# Patient Record
Sex: Male | Born: 1955
Health system: Southern US, Community
[De-identification: ages and names within clinical notes are randomized; demographics above are authoritative.]

## PROBLEM LIST (undated history)

## (undated) DIAGNOSIS — N186 End stage renal disease: Secondary | ICD-10-CM

## (undated) DIAGNOSIS — I639 Cerebral infarction, unspecified: Secondary | ICD-10-CM

## (undated) DIAGNOSIS — C801 Malignant (primary) neoplasm, unspecified: Secondary | ICD-10-CM

## (undated) DIAGNOSIS — I10 Essential (primary) hypertension: Secondary | ICD-10-CM

## (undated) DIAGNOSIS — Z992 Dependence on renal dialysis: Secondary | ICD-10-CM

## (undated) DIAGNOSIS — K920 Hematemesis: Secondary | ICD-10-CM

## (undated) DIAGNOSIS — E611 Iron deficiency: Secondary | ICD-10-CM

## (undated) DIAGNOSIS — IMO0001 Reserved for inherently not codable concepts without codable children: Secondary | ICD-10-CM

---

## 2016-01-21 ENCOUNTER — Inpatient Hospital Stay (HOSPITAL_COMMUNITY)
Admission: EM | Admit: 2016-01-21 | Discharge: 2016-01-24 | DRG: 065 | Disposition: A | Discharge status: Home / Self Care | Payer: BLUE CROSS/BLUE SHIELD | Attending: Internal Medicine | Admitting: Internal Medicine

## 2016-01-21 ENCOUNTER — Encounter (HOSPITAL_COMMUNITY): Payer: Self-pay | Admitting: *Deleted

## 2016-01-21 ENCOUNTER — Emergency Department (HOSPITAL_COMMUNITY): Payer: BLUE CROSS/BLUE SHIELD

## 2016-01-21 DIAGNOSIS — D509 Iron deficiency anemia, unspecified: Secondary | ICD-10-CM

## 2016-01-21 DIAGNOSIS — D631 Anemia in chronic kidney disease: Secondary | ICD-10-CM | POA: Diagnosis present

## 2016-01-21 DIAGNOSIS — N400 Enlarged prostate without lower urinary tract symptoms: Secondary | ICD-10-CM | POA: Diagnosis present

## 2016-01-21 DIAGNOSIS — E8770 Fluid overload, unspecified: Secondary | ICD-10-CM | POA: Diagnosis present

## 2016-01-21 DIAGNOSIS — E875 Hyperkalemia: Secondary | ICD-10-CM | POA: Diagnosis present

## 2016-01-21 DIAGNOSIS — N186 End stage renal disease: Secondary | ICD-10-CM

## 2016-01-21 DIAGNOSIS — H539 Unspecified visual disturbance: Secondary | ICD-10-CM | POA: Diagnosis not present

## 2016-01-21 DIAGNOSIS — N179 Acute kidney failure, unspecified: Secondary | ICD-10-CM | POA: Diagnosis not present

## 2016-01-21 DIAGNOSIS — I161 Hypertensive emergency: Secondary | ICD-10-CM | POA: Diagnosis present

## 2016-01-21 DIAGNOSIS — Z8673 Personal history of transient ischemic attack (TIA), and cerebral infarction without residual deficits: Secondary | ICD-10-CM | POA: Diagnosis not present

## 2016-01-21 DIAGNOSIS — N281 Cyst of kidney, acquired: Secondary | ICD-10-CM | POA: Diagnosis present

## 2016-01-21 DIAGNOSIS — N184 Chronic kidney disease, stage 4 (severe): Secondary | ICD-10-CM

## 2016-01-21 DIAGNOSIS — E1122 Type 2 diabetes mellitus with diabetic chronic kidney disease: Secondary | ICD-10-CM | POA: Diagnosis present

## 2016-01-21 DIAGNOSIS — E871 Hypo-osmolality and hyponatremia: Secondary | ICD-10-CM | POA: Diagnosis present

## 2016-01-21 DIAGNOSIS — Z992 Dependence on renal dialysis: Secondary | ICD-10-CM

## 2016-01-21 DIAGNOSIS — I63412 Cerebral infarction due to embolism of left middle cerebral artery: Principal | ICD-10-CM | POA: Diagnosis present

## 2016-01-21 DIAGNOSIS — I639 Cerebral infarction, unspecified: Secondary | ICD-10-CM | POA: Diagnosis present

## 2016-01-21 DIAGNOSIS — N185 Chronic kidney disease, stage 5: Secondary | ICD-10-CM | POA: Diagnosis present

## 2016-01-21 DIAGNOSIS — H538 Other visual disturbances: Secondary | ICD-10-CM

## 2016-01-21 DIAGNOSIS — I120 Hypertensive chronic kidney disease with stage 5 chronic kidney disease or end stage renal disease: Secondary | ICD-10-CM | POA: Diagnosis present

## 2016-01-21 DIAGNOSIS — E785 Hyperlipidemia, unspecified: Secondary | ICD-10-CM | POA: Diagnosis present

## 2016-01-21 HISTORY — DX: Essential (primary) hypertension: I10

## 2016-01-21 LAB — PROTIME-INR
INR: 0.99 (ref 0.00–1.49)
PROTHROMBIN TIME: 13.3 s (ref 11.6–15.2)

## 2016-01-21 LAB — DIFFERENTIAL
BASOS PCT: 1 %
Basophils Absolute: 0 10*3/uL (ref 0.0–0.1)
EOS PCT: 6 %
Eosinophils Absolute: 0.3 10*3/uL (ref 0.0–0.7)
LYMPHS ABS: 1.2 10*3/uL (ref 0.7–4.0)
LYMPHS PCT: 24 %
MONO ABS: 0.4 10*3/uL (ref 0.1–1.0)
MONOS PCT: 9 %
Neutro Abs: 3 10*3/uL (ref 1.7–7.7)
Neutrophils Relative %: 60 %

## 2016-01-21 LAB — COMPREHENSIVE METABOLIC PANEL
ALK PHOS: 81 U/L (ref 38–126)
ALT: 21 U/L (ref 17–63)
AST: 19 U/L (ref 15–41)
Albumin: 2.6 g/dL — ABNORMAL LOW (ref 3.5–5.0)
Anion gap: 12 (ref 5–15)
BILIRUBIN TOTAL: 0.3 mg/dL (ref 0.3–1.2)
BUN: 30 mg/dL — AB (ref 6–20)
CO2: 24 mmol/L (ref 22–32)
CREATININE: 6.03 mg/dL — AB (ref 0.61–1.24)
Calcium: 7.9 mg/dL — ABNORMAL LOW (ref 8.9–10.3)
Chloride: 99 mmol/L — ABNORMAL LOW (ref 101–111)
GFR calc Af Amer: 11 mL/min — ABNORMAL LOW (ref >60)
GFR calc non Af Amer: 9 mL/min — ABNORMAL LOW (ref >60)
GLUCOSE: 109 mg/dL — AB (ref 65–99)
POTASSIUM: 4.1 mmol/L (ref 3.5–5.1)
SODIUM: 135 mmol/L (ref 135–145)
TOTAL PROTEIN: 5.9 g/dL — AB (ref 6.5–8.1)

## 2016-01-21 LAB — RAPID URINE DRUG SCREEN, HOSP PERFORMED
AMPHETAMINES: NOT DETECTED
BARBITURATES: NOT DETECTED
BENZODIAZEPINES: NOT DETECTED
Cocaine: NOT DETECTED
Opiates: NOT DETECTED
Tetrahydrocannabinol: NOT DETECTED

## 2016-01-21 LAB — I-STAT CHEM 8, ED
BUN: 31 mg/dL — AB (ref 6–20)
CALCIUM ION: 0.98 mmol/L — AB (ref 1.12–1.23)
CHLORIDE: 96 mmol/L — AB (ref 101–111)
Creatinine, Ser: 5.8 mg/dL — ABNORMAL HIGH (ref 0.61–1.24)
Glucose, Bld: 103 mg/dL — ABNORMAL HIGH (ref 65–99)
HCT: 38 % — ABNORMAL LOW (ref 39.0–52.0)
Hemoglobin: 12.9 g/dL — ABNORMAL LOW (ref 13.0–17.0)
Potassium: 4.1 mmol/L (ref 3.5–5.1)
SODIUM: 134 mmol/L — AB (ref 135–145)
TCO2: 25 mmol/L (ref 0–100)

## 2016-01-21 LAB — IRON AND TIBC
IRON: 33 ug/dL — AB (ref 45–182)
Saturation Ratios: 18 % (ref 17.9–39.5)
TIBC: 186 ug/dL — ABNORMAL LOW (ref 250–450)
UIBC: 153 ug/dL

## 2016-01-21 LAB — APTT: aPTT: 32 seconds (ref 24–37)

## 2016-01-21 LAB — CBC
HCT: 34.1 % — ABNORMAL LOW (ref 39.0–52.0)
HEMOGLOBIN: 11 g/dL — AB (ref 13.0–17.0)
MCH: 25.6 pg — AB (ref 26.0–34.0)
MCHC: 32.3 g/dL (ref 30.0–36.0)
MCV: 79.5 fL (ref 78.0–100.0)
Platelets: 415 10*3/uL — ABNORMAL HIGH (ref 150–400)
RBC: 4.29 MIL/uL (ref 4.22–5.81)
RDW: 14.6 % (ref 11.5–15.5)
WBC: 5 10*3/uL (ref 4.0–10.5)

## 2016-01-21 LAB — I-STAT TROPONIN, ED: Troponin i, poc: 0.01 ng/mL (ref 0.00–0.08)

## 2016-01-21 LAB — CBG MONITORING, ED: Glucose-Capillary: 107 mg/dL — ABNORMAL HIGH (ref 65–99)

## 2016-01-21 MED ORDER — ACETAMINOPHEN 650 MG RE SUPP: 650.0000 mg | Freq: Four times a day (QID) | RECTAL | Status: DC | PRN

## 2016-01-21 MED ORDER — FLUORESCEIN SODIUM 1 MG OP STRP
1.0000 | ORAL_STRIP | Freq: Once | OPHTHALMIC | Status: AC
  Administered 2016-01-21: 1 via OPHTHALMIC
  Filled 2016-01-21: qty 1

## 2016-01-21 MED ORDER — CLONIDINE HCL 0.1 MG PO TABS
0.1000 mg | ORAL_TABLET | Freq: Four times a day (QID) | ORAL | Status: DC | PRN
  Administered 2016-01-21 – 2016-01-22 (×2): 0.1 mg via ORAL
  Filled 2016-01-21 (×2): qty 1

## 2016-01-21 MED ORDER — ACETAMINOPHEN 325 MG PO TABS: 650.0000 mg | ORAL_TABLET | Freq: Four times a day (QID) | ORAL | Status: DC | PRN

## 2016-01-21 MED ORDER — LOSARTAN POTASSIUM-HCTZ 100-25 MG PO TABS: 1.0000 | ORAL_TABLET | Freq: Every day | ORAL | Status: DC

## 2016-01-21 MED ORDER — LOSARTAN POTASSIUM 50 MG PO TABS: 100.0000 mg | ORAL_TABLET | Freq: Every day | ORAL | Status: DC

## 2016-01-21 MED ORDER — LABETALOL HCL 5 MG/ML IV SOLN
10.0000 mg | Freq: Once | INTRAVENOUS | Status: AC
  Administered 2016-01-21: 10 mg via INTRAVENOUS
  Filled 2016-01-21: qty 4

## 2016-01-21 MED ORDER — ONDANSETRON HCL 4 MG/2ML IJ SOLN: 4.0000 mg | Freq: Four times a day (QID) | INTRAMUSCULAR | Status: DC | PRN

## 2016-01-21 MED ORDER — METOPROLOL TARTRATE 25 MG PO TABS
25.0000 mg | ORAL_TABLET | Freq: Two times a day (BID) | ORAL | Status: DC
  Administered 2016-01-21 – 2016-01-22 (×2): 25 mg via ORAL
  Filled 2016-01-21 (×2): qty 1

## 2016-01-21 MED ORDER — SENNOSIDES-DOCUSATE SODIUM 8.6-50 MG PO TABS: 1.0000 | ORAL_TABLET | Freq: Every evening | ORAL | Status: DC | PRN

## 2016-01-21 MED ORDER — HYDRALAZINE HCL 20 MG/ML IJ SOLN
5.0000 mg | Freq: Four times a day (QID) | INTRAMUSCULAR | Status: DC | PRN
  Administered 2016-01-21: 5 mg via INTRAVENOUS
  Filled 2016-01-21: qty 1

## 2016-01-21 MED ORDER — HEPARIN SODIUM (PORCINE) 5000 UNIT/ML IJ SOLN
5000.0000 [IU] | Freq: Three times a day (TID) | INTRAMUSCULAR | Status: DC
  Administered 2016-01-21 – 2016-01-24 (×9): 5000 [IU] via SUBCUTANEOUS
  Filled 2016-01-21 (×8): qty 1

## 2016-01-21 MED ORDER — TETRACAINE HCL 0.5 % OP SOLN
2.0000 [drp] | Freq: Once | OPHTHALMIC | Status: AC
  Administered 2016-01-21: 2 [drp] via OPHTHALMIC
  Filled 2016-01-21: qty 2

## 2016-01-21 MED ORDER — HYDROCHLOROTHIAZIDE 25 MG PO TABS: 25.0000 mg | ORAL_TABLET | Freq: Every day | ORAL | Status: DC

## 2016-01-21 MED ORDER — HYDROCODONE-ACETAMINOPHEN 5-325 MG PO TABS: 1.0000 | ORAL_TABLET | ORAL | Status: DC | PRN

## 2016-01-21 MED ORDER — SODIUM CHLORIDE 0.9% FLUSH
3.0000 mL | Freq: Two times a day (BID) | INTRAVENOUS | Status: DC
  Administered 2016-01-22 – 2016-01-24 (×5): 3 mL via INTRAVENOUS

## 2016-01-21 MED ORDER — HYDRALAZINE HCL 25 MG PO TABS
25.0000 mg | ORAL_TABLET | Freq: Three times a day (TID) | ORAL | Status: DC
  Administered 2016-01-21: 25 mg via ORAL
  Filled 2016-01-21: qty 1

## 2016-01-21 MED ORDER — ONDANSETRON HCL 4 MG PO TABS: 4.0000 mg | ORAL_TABLET | Freq: Four times a day (QID) | ORAL | Status: DC | PRN

## 2016-01-21 NOTE — ED Provider Notes (Signed)
CSN: [id number]     Arrival date & time 01/21/16  [id number] History   First MD Initiated Contact with Patient 01/21/16 1002     Chief Complaint  Patient presents with  . Visual Field Change     (Consider location/radiation/quality/duration/timing/severity/associated sxs/prior Treatment) HPI  Blood pressure 224/103, pulse 75, temperature 97.7 F (36.5 C), temperature source Oral, resp. rate 18, height 5\' 4"  (1.626 m), weight 79.379 kg, SpO2 100 %.  Allen Miller is a 60 y.o. male complaining of blurred vision in the medial aspects of the left eye onset yesterday evening. Patient woke up this morning and states that the blurred vision has improved but it still persists. Patient denies active headache, states that he had a mild, 6 out of 10 left posterior occipital headache which resolved spontaneously last night. He's been compliant with his hypertension medications of lisinopril and metoprolol. Patient denies numbness, weakness, dysarthria, ataxia, chest pain, shortness of breath, nausea, vomiting, history of atrial fibrillation. States that he used to be a diabetic but is no longer required to take medications, is a former smoker and hasn't smoked in 45 years. He is in the area visiting his son, he plans on moving here from [place].  Patient states that since he takes his blood pressure regularly with a cough from [facility], states the systolic always runs in the Q000111Q, systolic is never in the [id number].  Past Medical History  Diagnosis Date  . Hypertension    History reviewed. No pertinent past surgical history. No family history on file. Social History  Substance Use Topics  . Smoking status: Never Smoker   . Smokeless tobacco: None  . Alcohol Use: None    Review of Systems  10 systems reviewed and found to be negative, except as noted in the HPI.  Allergies  Review of patient's allergies indicates no known allergies.  Home Medications   Prior to Admission medications   Not on  File   BP 224/103 mmHg  Pulse 75  Temp(Src) 97.7 F (36.5 C) (Oral)  Resp 18  Ht 5\' 4"  (1.626 m)  Wt 79.379 kg  BMI 30.02 kg/m2  SpO2 100% Physical Exam  Constitutional: He is oriented to person, place, and time. He appears well-developed and well-nourished.  HENT:  Head: Normocephalic and atraumatic.  Mouth/Throat: Oropharynx is clear and moist.  Eyes: Conjunctivae and EOM are normal. Pupils are equal, round, and reactive to light.  Extraocular movement is intact without pain or diplopia pupils are equal round and reactive to light, there is no afferent pupillary defect. Intraocular pressure is 17 mmHg on the left.  No TTP or induration of temporal arteries bilaterally  Neck: Normal range of motion. Neck supple.  FROM to C-spine. Pt can touch chin to chest without discomfort. No TTP of midline cervical spine.   Cardiovascular: Normal rate, regular rhythm and intact distal pulses.   Pulmonary/Chest: Effort normal and breath sounds normal. No respiratory distress. He has no wheezes. He has no rales. He exhibits no tenderness.  Abdominal: Soft. Bowel sounds are normal. There is no tenderness.  Musculoskeletal: Normal range of motion. He exhibits no edema or tenderness.  Neurological: He is alert and oriented to person, place, and time. No cranial nerve deficit.  Left eye, patient can count fingers III/IV/VI-Extraocular movements intact.  Pupils reactive bilaterally. V/VII-Smile symmetric, equal eyebrow raise,  facial sensation intact VIII- Hearing grossly intact IX/X-Normal gag XI-bilateral shoulder shrug XII-midline tongue extension Motor: 5/5 bilaterally with normal tone and bulk Cerebellar: Normal  finger-to-nose  and normal heel-to-shin test.   Romberg negative Ambulates with a coordinated gait   Nursing note and vitals reviewed.   ED Course  Procedures (including critical care time)  CRITICAL CARE Performed by: Monico Blitz   Total critical care time:  86minutes  Critical care time was exclusive of separately billable procedures and treating other patients.  Critical care was necessary to treat or prevent imminent or life-threatening deterioration.  Critical care was time spent personally by me on the following activities: development of treatment plan with patient and/or surrogate as well as nursing, discussions with consultants, evaluation of patient's response to treatment, examination of patient, obtaining history from patient or surrogate, ordering and performing treatments and interventions, ordering and review of laboratory studies, ordering and review of radiographic studies, pulse oximetry and re-evaluation of patient's condition.   EMERGENCY DEPARTMENT [place] OCULAR EXAM "Study: Limited Ultrasound of Orbit "  INDICATIONS: Vision loss  Linear probe utilized to obtain images in both long and short axis of the orbit having the patient look left and right if possible.  PERFORMED BY: Myself  IMAGES ARCHIVED?: Yes  LIMITATIONS: none  VIEWS USED: Left orbit  INTERPRETATION: No retinal detachment, Lens in proper position   Labs Review Labs Reviewed  CBC - Abnormal; Notable for the following:    Hemoglobin 11.0 (*)    HCT 34.1 (*)    MCH 25.6 (*)    Platelets 415 (*)    All other components within normal limits  COMPREHENSIVE METABOLIC PANEL - Abnormal; Notable for the following:    Chloride 99 (*)    Glucose, Bld 109 (*)    BUN 30 (*)    Creatinine, Ser 6.03 (*)    Calcium 7.9 (*)    Total Protein 5.9 (*)    Albumin 2.6 (*)    GFR calc non Af Amer 9 (*)    GFR calc Af Amer 11 (*)    All other components within normal limits  I-STAT CHEM 8, ED - Abnormal; Notable for the following:    Sodium 134 (*)    Chloride 96 (*)    BUN 31 (*)    Creatinine, Ser 5.80 (*)    Glucose, Bld 103 (*)    Calcium, Ion 0.98 (*)    Hemoglobin 12.9 (*)    HCT 38.0 (*)    All other components within normal limits  PROTIME-INR  APTT   DIFFERENTIAL  I-STAT TROPOININ, ED  CBG MONITORING, ED    Imaging Review Ct Head Wo Contrast  01/21/2016  CLINICAL DATA:  Blurred vision in the left eye since yesterday. Improving today. Posterior headache left side since yesterday EXAM: CT HEAD WITHOUT CONTRAST TECHNIQUE: Contiguous axial images were obtained from the base of the skull through the vertex without intravenous contrast. COMPARISON:  None. FINDINGS: Old appearing posterior left parietal infarct. Mild chronic small vessel disease throughout the deep white matter. Old right basal ganglia lacunar infarcts. No acute intracranial abnormality. Specifically, no hemorrhage, hydrocephalus, mass lesion, acute infarction, or significant intracranial injury. No acute calvarial abnormality. Visualized paranasal sinuses and mastoids clear. Orbital soft tissues unremarkable. IMPRESSION: Old right basal ganglia and left posterior parietal infarcts. Chronic small vessel disease. No acute intracranial abnormality. Electronically Signed   By: Rolm Baptise M.D.   On: 01/21/2016 09:59   I have personally reviewed and evaluated these images and lab results as part of my medical decision-making.   EKG Interpretation None      MDM   Final  diagnoses:  Blurred vision, left eye  Hypertensive emergency    Filed Vitals:   01/21/16 0850  BP: 224/103  Pulse: 75  Temp: 97.7 F (36.5 C)  TempSrc: Oral  Resp: 18  Height: 5\' 4"  (1.626 m)  Weight: 79.379 kg  SpO2: 100%    Medications  tetracaine (PONTOCAINE) 0.5 % ophthalmic solution 2 drop (not administered)  fluorescein ophthalmic strip 1 strip (not administered)    Allen Miller is 60 y.o. male presenting with 2 onset of left eye blurred vision onset yesterday, his blood pressure is elevated at 224/103, he states he's been compliant with both his losartan hydrochlorothiazide and his metoprolol. States that his blood pressure is always in the 150s.Concern for hypertensive emergency given his  newly elevated blood pressure and loss of vision.no retinal detachment on ultrasound, no temporal artery induration or tenderness. Discussed case with ophthalmologist however, this patient needs to stay in the hospital to have his blood pressure controlled. This will be an unassigned admission.            Monico Blitz, PA-C 01/22/16 0717  Monico Blitz, PA-C 01/22/16 [id number]  Gareth Morgan, MD 01/23/16 1250

## 2016-01-21 NOTE — ED Notes (Signed)
[place] and medication at the bedside

## 2016-01-21 NOTE — ED Notes (Addendum)
Attempted IV x1. Unable to access. PA at the bedside performing eye exam with [place]. PA attempting [place] IV

## 2016-01-21 NOTE — Plan of Care (Signed)
Problem: Health Behavior/Discharge Planning: Goal: Ability to manage health-related needs will improve Outcome: Progressing Patient admitted to [facility] with accelerated hypertension and 24 hour history of left medial visual field blurring. Apparently has history of impaired renal function with creatinine around 4 mg/dL, which is currently elevated above his baseline at 5.8 mg/dL. He is making adequate urine; voided on arrival to floor.  CT of head done in ED did not reveal anything acute. Other than visual field blurring as previously described, patient has no symptoms. Passed swallow screen in ED; NIHSS = 1 on admission to [facility].  Vital signs stable. Patient remains hypertensive, but showing excellent reduction of systolic and diastolic BP (Q000111Q mmHg, respectively) after administration of hydralazine in ED: Filed Vitals:    01/21/16 1915 01/21/16 1930 01/21/16 2000 01/21/16 2040  BP: 171/80 173/83 136/69 171/78  Pulse: 80 78 74 78  Temp:       98.2 F (36.8 C)  TempSrc:       Oral  Resp: 14 11 16 16   Height:       5\' 4"  (1.626 m)  Weight:       78.699 kg (173 lb 8 oz)  SpO2: 100% 100% 100% 100%    Educated patient and family at bedside re:  Plan of care (General medical)  Labs/tests/procedures:  Renal function (BUN/creatinine)  Metabolic  CT of head  CXR  Possible MRI  Possible carotid duplex  Possible 2-D echocardiogram  Medications:  Clonidine  Metoprolol  Hydralazine  Heparin SQ  Unit routines/pain rating scale  Frequency of vital signs/neuro checks  Need to call RN for any symptoms  Safety plan  Initial discharge plan  At this time, patient and family have questions about length of stay. Attempted to answer these concerns to the best of my ability.  Continuing to monitor closely.

## 2016-01-21 NOTE — ED Notes (Addendum)
PA Elmyra Ricks at the bedside   Pt returned from [place]

## 2016-01-21 NOTE — ED Notes (Signed)
IV Team at the bedside. 

## 2016-01-21 NOTE — H&P (Signed)
[facility] History and Physical  Giovann Salahuddin [id number] DOB: Apr 17, 1956 DOA: 01/21/2016  Referring physician: Emergency Department PCP: No primary care provider on file.   CHIEF COMPLAINT:                   HPI: Allen Miller is a 60 y.o. male  from [place] in town visiting his son. Patient has a history of hypertension and chronic kidney disease. He developed blurry vision and left eye yesterday. Due to persistent visual disturbances patient came to the emergency room department where his blood pressure was found to be significantly elevated. No chest pains or shortness of breath. No nausea or vomiting. Patient believes his creatinine is normally around 4, it is 6 today. Patient took his home blood pressure medications this morning. He monitors blood pressure at home and states his systolic is usually in the 150s as it was this morning. Patient does not smoke.   ED COURSE:           Labs:   BUN 30, creatinine 6, glucose 109, troponin 0.01 White count 5, hemoglobin 11, MCV 70 9.5, platelets 415  Medications  tetracaine (PONTOCAINE) 0.5 % ophthalmic solution 2 drop (2 drops Right Eye Given 01/21/16 1027)  fluorescein ophthalmic strip 1 strip (1 strip Left Eye Given 01/21/16 1027)  labetalol (NORMODYNE,TRANDATE) injection 10 mg (10 mg Intravenous Given 01/21/16 1306)    Review of Systems  Constitutional: Negative.   HENT: Negative.   Eyes: Positive for blurred vision.  Respiratory: Negative.   Cardiovascular: Negative.   Gastrointestinal: Negative.   Genitourinary: Negative.   Musculoskeletal: Negative.   Skin: Negative.   Neurological: Negative.   Endo/Heme/Allergies: Negative.   Psychiatric/Behavioral: Negative.     Past Medical History  Diagnosis Date  . Hypertension    History reviewed. No pertinent past surgical history.  SOCIAL HISTORY:  reports that he has never smoked. He does not have any smokeless tobacco history on file. His alcohol and drug  histories are not on file. Lives: at home with son  Assistive devices:   None needed for ambulation.   No Known Allergies   FMH: hypertension  Prior to Admission medications   Medication Sig Start Date End Date Taking? Authorizing Provider  losartan-hydrochlorothiazide (HYZAAR) 100-25 MG tablet Take 1 tablet by mouth daily. 11/07/15  Yes Historical Provider, MD  metoprolol tartrate (LOPRESSOR) 25 MG tablet Take 25 mg by mouth 2 (two) times daily.   Yes Historical Provider, MD   PHYSICAL EXAM: Filed Vitals:   01/21/16 1300 01/21/16 1400 01/21/16 1500 01/21/16 1600  BP: 210/95 204/102 181/96 213/100  Pulse: 64 75 75 74  Temp:      TempSrc:      Resp: 15   21  Height:      Weight:      SpO2: 100% 98% 100% 100%    Wt Readings from Last 3 Encounters:  01/21/16 79.379 kg (175 lb)    General:  Pleasant black male. Appears calm and comfortable Eyes: PER, normal lids, irises & conjunctiva ENT: grossly normal hearing, lips & tongue Neck: no LAD, no masses Cardiovascular: RRR, no murmurs, 2+ BLE edema  Respiratory: Respirations even and unlabored. Normal respiratory effort. Lungs CTA bilaterally, no wheezes / rales .   Abdomen: soft, non-distended, non-tender, active bowel sounds. No obvious masses.  Skin: no rash seen on limited exam Musculoskeletal: grossly normal tone BUE/BLE Psychiatric: grossly normal mood and affect, speech fluent and appropriate Neurologic: grossly non-focal.  LABS ON ADMISSION:    Basic Metabolic Panel:  Recent Labs Lab 01/21/16 0905 01/21/16 0915  NA 135 134*  K 4.1 4.1  CL 99* 96*  CO2 24  --   GLUCOSE 109* 103*  BUN 30* 31*  CREATININE 6.03* 5.80*  CALCIUM 7.9*  --    Liver Function Tests:  Recent Labs Lab 01/21/16 0905  AST 19  ALT 21  ALKPHOS 81  BILITOT 0.3  PROT 5.9*  ALBUMIN 2.6*    CBC:  Recent Labs Lab 01/21/16 0905 01/21/16 0915  WBC 5.0  --   NEUTROABS 3.0  --   HGB 11.0* 12.9*  HCT 34.1* 38.0*  MCV 79.5   --   PLT 415*  --    CREATININE: 5.8 mg/dL ABNORMAL (01/21/16 0915) Estimated creatinine clearance - 13.2 mL/min  Radiological Exams on Admission: Ct Head Wo Contrast  01/21/2016  CLINICAL DATA:  Blurred vision in the left eye since yesterday. Improving today. Posterior headache left side since yesterday EXAM: CT HEAD WITHOUT CONTRAST TECHNIQUE: Contiguous axial images were obtained from the base of the skull through the vertex without intravenous contrast. COMPARISON:  None. FINDINGS: Old appearing posterior left parietal infarct. Mild chronic small vessel disease throughout the deep white matter. Old right basal ganglia lacunar infarcts. No acute intracranial abnormality. Specifically, no hemorrhage, hydrocephalus, mass lesion, acute infarction, or significant intracranial injury. No acute calvarial abnormality. Visualized paranasal sinuses and mastoids clear. Orbital soft tissues unremarkable. IMPRESSION: Old right basal ganglia and left posterior parietal infarcts. Chronic small vessel disease. No acute intracranial abnormality. Electronically Signed   By: Rolm Baptise M.D.   On: 01/21/2016 09:59    ASSESSMENT / PLAN   Hypertensive emergency. Progressive target organ dysfunction manifested by worsening of chronic renal failure. Expresses compliance with home meds. SBP runs in 150's at home.  -admit to observation - telemetry -UDS -obtain an EKG -echocardiogram - peripheral edema -continue home lopressor and losartan.  Add prn Hydralazine and clonidine  SBP goal in 24 hours < 180 (25%) decrease from initial BP  Acute on chronic stage 4 CKD. Per patient baseline Cr around 4, currently 6. Doesn't appear dehydrated. Can eat and drink. Will hold on any IV  fluids for now.  -am bmet  Visual disturbances. Blurry vision left eye since yesterday. Secondary to HTN?  Head CTscan negative. Symptoms improving. Will see if continues to improve along with BP. Further workup pending clinical course.    Microcytic anemia, mild.  -check iron studies -further workup as outpatient  History of CVA based on head CT scan  Patient will soon be relocating to [place]. I have advised patient to go ahead and make a future appointment with a local PCP so that he does not get here and run out of medications. Will eventually need to establish care with local Nephrologist as well.   CONSULTANTS:    None  Code Status: full code DVT Prophylaxis:  Heparin Family Communication:  Patient alert, oriented and understands plan of care.  Disposition Plan: Discharge to home in 24-48 hours   Time spent: 60 minutes Tye Savoy  NP [facility] Pager [phone]

## 2016-01-21 NOTE — ED Notes (Signed)
Renal/vegetarian tray ordered

## 2016-01-21 NOTE — ED Notes (Signed)
Pt reports that he had loss of vision in his left eye last night. Pt states that he not has a "black spot in his vision on the left side. No other neuro deficits in triage.

## 2016-01-21 NOTE — ED Notes (Signed)
Pt was not in room when this RN tried to assess. Pt was said to be in CT and patient would be brought back

## 2016-01-22 ENCOUNTER — Observation Stay (HOSPITAL_COMMUNITY): Payer: BLUE CROSS/BLUE SHIELD

## 2016-01-22 ENCOUNTER — Telehealth: Payer: Self-pay

## 2016-01-22 DIAGNOSIS — H539 Unspecified visual disturbance: Secondary | ICD-10-CM | POA: Diagnosis not present

## 2016-01-22 DIAGNOSIS — Z8673 Personal history of transient ischemic attack (TIA), and cerebral infarction without residual deficits: Secondary | ICD-10-CM | POA: Diagnosis not present

## 2016-01-22 DIAGNOSIS — N179 Acute kidney failure, unspecified: Secondary | ICD-10-CM | POA: Diagnosis not present

## 2016-01-22 DIAGNOSIS — I161 Hypertensive emergency: Secondary | ICD-10-CM

## 2016-01-22 DIAGNOSIS — D509 Iron deficiency anemia, unspecified: Secondary | ICD-10-CM | POA: Diagnosis not present

## 2016-01-22 LAB — URINALYSIS, ROUTINE W REFLEX MICROSCOPIC
Bilirubin Urine: NEGATIVE
GLUCOSE, UA: NEGATIVE mg/dL
Ketones, ur: NEGATIVE mg/dL
LEUKOCYTES UA: NEGATIVE
Nitrite: NEGATIVE
PH: 7.5 (ref 5.0–8.0)
Protein, ur: >300 mg/dL — AB
Specific Gravity, Urine: 1.005 (ref 1.005–1.030)

## 2016-01-22 LAB — URINE MICROSCOPIC-ADD ON

## 2016-01-22 LAB — CBC
HCT: 28.4 % — ABNORMAL LOW (ref 39.0–52.0)
Hemoglobin: 9.4 g/dL — ABNORMAL LOW (ref 13.0–17.0)
MCH: 26.8 pg (ref 26.0–34.0)
MCHC: 33.1 g/dL (ref 30.0–36.0)
MCV: 80.9 fL (ref 78.0–100.0)
PLATELETS: 314 10*3/uL (ref 150–400)
RBC: 3.51 MIL/uL — AB (ref 4.22–5.81)
RDW: 15.1 % (ref 11.5–15.5)
WBC: 4.2 10*3/uL (ref 4.0–10.5)

## 2016-01-22 LAB — BASIC METABOLIC PANEL
ANION GAP: 6 (ref 5–15)
BUN: 32 mg/dL — ABNORMAL HIGH (ref 6–20)
CALCIUM: 7.3 mg/dL — AB (ref 8.9–10.3)
CO2: 26 mmol/L (ref 22–32)
CREATININE: 6.06 mg/dL — AB (ref 0.61–1.24)
Chloride: 100 mmol/L — ABNORMAL LOW (ref 101–111)
GFR, EST AFRICAN AMERICAN: 11 mL/min — AB (ref >60)
GFR, EST NON AFRICAN AMERICAN: 9 mL/min — AB (ref >60)
Glucose, Bld: 112 mg/dL — ABNORMAL HIGH (ref 65–99)
Potassium: 4.8 mmol/L (ref 3.5–5.1)
SODIUM: 132 mmol/L — AB (ref 135–145)

## 2016-01-22 LAB — FERRITIN: FERRITIN: 200 ng/mL (ref 24–336)

## 2016-01-22 MED ORDER — HYDROCODONE-ACETAMINOPHEN 5-325 MG PO TABS: 1.0000 | ORAL_TABLET | ORAL | Status: DC | PRN

## 2016-01-22 MED ORDER — FUROSEMIDE 80 MG PO TABS
80.0000 mg | ORAL_TABLET | Freq: Two times a day (BID) | ORAL | Status: DC
  Administered 2016-01-22 – 2016-01-24 (×3): 80 mg via ORAL
  Filled 2016-01-22 (×3): qty 1

## 2016-01-22 MED ORDER — CARVEDILOL 12.5 MG PO TABS
12.5000 mg | ORAL_TABLET | Freq: Two times a day (BID) | ORAL | Status: DC
Start: 2016-01-22 — End: 2016-01-24
  Administered 2016-01-22 – 2016-01-24 (×3): 12.5 mg via ORAL
  Filled 2016-01-22 (×2): qty 1

## 2016-01-22 MED ORDER — LORAZEPAM 2 MG/ML IJ SOLN: 1.0000 mg | Freq: Once | INTRAMUSCULAR | Status: DC

## 2016-01-22 MED ORDER — HYDRALAZINE HCL 20 MG/ML IJ SOLN: 10.0000 mg | Freq: Four times a day (QID) | INTRAMUSCULAR | Status: DC | PRN

## 2016-01-22 MED ORDER — CLONIDINE HCL 0.1 MG PO TABS
0.1000 mg | ORAL_TABLET | Freq: Three times a day (TID) | ORAL | Status: DC
  Administered 2016-01-22 (×2): 0.1 mg via ORAL
  Filled 2016-01-22 (×2): qty 1

## 2016-01-22 MED ORDER — DARBEPOETIN ALFA 60 MCG/0.3ML IJ SOSY
60.0000 ug | PREFILLED_SYRINGE | Freq: Once | INTRAMUSCULAR | Status: AC
  Administered 2016-01-22: 60 ug via SUBCUTANEOUS
  Filled 2016-01-22: qty 0.3

## 2016-01-22 MED ORDER — SODIUM CHLORIDE 0.9 % IV SOLN
125.0000 mg | Freq: Every day | INTRAVENOUS | Status: AC
  Administered 2016-01-22 – 2016-01-24 (×3): 125 mg via INTRAVENOUS
  Filled 2016-01-22 (×5): qty 10

## 2016-01-22 MED ORDER — ASPIRIN 81 MG PO CHEW
81.0000 mg | CHEWABLE_TABLET | Freq: Every day | ORAL | Status: DC
  Administered 2016-01-22 – 2016-01-24 (×3): 81 mg via ORAL
  Filled 2016-01-22 (×4): qty 1

## 2016-01-22 MED ORDER — SODIUM CHLORIDE 0.9 % IV SOLN
INTRAVENOUS | Status: DC
  Administered 2016-01-22: 11:00:00 via INTRAVENOUS

## 2016-01-22 NOTE — Consult Note (Signed)
[facility] Renal Consultation Note  Requesting MD: Candiss Norse Indication for Consultation: Chronic kidney disease and malignant hypertension  HPI:  Allen Miller is a 60 y.o. male who gives a past medical history significant for malignant hypertension as well as kidney disease.  He is from [place] and is in town visiting his son. He had recently established with a nephrologist in [facility] and actually had an appointment to see him again soon. He tells me that the nephrologist told him his kidneys were only at 16% not quite at the threshold of dialysis but close. He presented to the emergency department yesterday due to visual disturbance in his left eye. He was found to have a significantly elevated blood pressure and a creatinine of 6 this was admitted and I'm consulted for further management. Urine output is being recorded over 500 mL since admission. Blood pressure still significantly elevated. He's been placed on a regimen of clonidine 3 times a day as well as metoprolol twice a day with hydralazine IV on a when necessary basis. I asked him about uremic symptoms and he does describe being nauseated last week but feels that that is improved. He denies any significant fatigue. He was on an ARB/HCT combination drug for blood pressure. He has pretty significant lower shotty edema and reports that he's had it at least for the last 4-5 months.  His urinalysis does show some protein but no blood. He is anemic and iron stores are low.  He is still having this visual disturbance.   CREATININE, SER  Date/Time Value Ref Range Status  01/22/2016 05:30 AM 6.06* 0.61 - 1.24 mg/dL Final  01/21/2016 09:15 AM 5.80* 0.61 - 1.24 mg/dL Final  01/21/2016 09:05 AM 6.03* 0.61 - 1.24 mg/dL Final     PMHx:   Past Medical History  Diagnosis Date  . Hypertension     History reviewed. No pertinent past surgical history.  Family Hx: No family history on file.  Social History:  reports that he has  never smoked. He does not have any smokeless tobacco history on file. His alcohol and drug histories are not on file.  Allergies: No Known Allergies  Medications: Prior to Admission medications   Medication Sig Start Date End Date Taking? Authorizing Provider  losartan-hydrochlorothiazide (HYZAAR) 100-25 MG tablet Take 1 tablet by mouth daily. 11/07/15  Yes Historical Provider, MD  metoprolol tartrate (LOPRESSOR) 25 MG tablet Take 25 mg by mouth 2 (two) times daily.   Yes Historical Provider, MD    I have reviewed the patient's current medications.  Labs:  Results for orders placed or performed during the hospital encounter of 01/21/16 (from the past 48 hour(s))  Protime-INR     Status: None   Collection Time: 01/21/16  9:05 AM  Result Value Ref Range   Prothrombin Time 13.3 11.6 - 15.2 seconds   INR 0.99 0.00 - 1.49  APTT     Status: None   Collection Time: 01/21/16  9:05 AM  Result Value Ref Range   aPTT 32 24 - 37 seconds  CBC     Status: Abnormal   Collection Time: 01/21/16  9:05 AM  Result Value Ref Range   WBC 5.0 4.0 - 10.5 K/uL   RBC 4.29 4.22 - 5.81 MIL/uL   Hemoglobin 11.0 (L) 13.0 - 17.0 g/dL   HCT 34.1 (L) 39.0 - 52.0 %   MCV 79.5 78.0 - 100.0 fL   MCH 25.6 (L) 26.0 - 34.0 pg   MCHC 32.3 30.0 -  36.0 g/dL   RDW 14.6 11.5 - 15.5 %   Platelets 415 (H) 150 - 400 K/uL  Differential     Status: None   Collection Time: 01/21/16  9:05 AM  Result Value Ref Range   Neutrophils Relative % 60 %   Neutro Abs 3.0 1.7 - 7.7 K/uL   Lymphocytes Relative 24 %   Lymphs Abs 1.2 0.7 - 4.0 K/uL   Monocytes Relative 9 %   Monocytes Absolute 0.4 0.1 - 1.0 K/uL   Eosinophils Relative 6 %   Eosinophils Absolute 0.3 0.0 - 0.7 K/uL   Basophils Relative 1 %   Basophils Absolute 0.0 0.0 - 0.1 K/uL  Comprehensive metabolic panel     Status: Abnormal   Collection Time: 01/21/16  9:05 AM  Result Value Ref Range   Sodium 135 135 - 145 mmol/L   Potassium 4.1 3.5 - 5.1 mmol/L   Chloride  99 (L) 101 - 111 mmol/L   CO2 24 22 - 32 mmol/L   Glucose, Bld 109 (H) 65 - 99 mg/dL   BUN 30 (H) 6 - 20 mg/dL   Creatinine, Ser 6.03 (H) 0.61 - 1.24 mg/dL   Calcium 7.9 (L) 8.9 - 10.3 mg/dL   Total Protein 5.9 (L) 6.5 - 8.1 g/dL   Albumin 2.6 (L) 3.5 - 5.0 g/dL   AST 19 15 - 41 U/L   ALT 21 17 - 63 U/L   Alkaline Phosphatase 81 38 - 126 U/L   Total Bilirubin 0.3 0.3 - 1.2 mg/dL   GFR calc non Af Amer 9 (L) >60 mL/min   GFR calc Af Amer 11 (L) >60 mL/min    Comment: (NOTE) The eGFR has been calculated using the CKD EPI equation. This calculation has not been validated in all clinical situations. eGFR's persistently <60 mL/min signify possible Chronic Kidney Disease.    Anion gap 12 5 - 15  I-stat troponin, ED (not at [facility], [facility])     Status: None   Collection Time: 01/21/16  9:14 AM  Result Value Ref Range   Troponin i, poc 0.01 0.00 - 0.08 ng/mL   Comment 3            Comment: Due to the release kinetics of cTnI, a negative result within the first hours of the onset of symptoms does not rule out myocardial infarction with certainty. If myocardial infarction is still suspected, repeat the test at appropriate intervals.   I-Stat Chem 8, ED  (not at [facility], [facility])     Status: Abnormal   Collection Time: 01/21/16  9:15 AM  Result Value Ref Range   Sodium 134 (L) 135 - 145 mmol/L   Potassium 4.1 3.5 - 5.1 mmol/L   Chloride 96 (L) 101 - 111 mmol/L   BUN 31 (H) 6 - 20 mg/dL   Creatinine, Ser 5.80 (H) 0.61 - 1.24 mg/dL   Glucose, Bld 103 (H) 65 - 99 mg/dL   Calcium, Ion 0.98 (L) 1.12 - 1.23 mmol/L   TCO2 25 0 - 100 mmol/L   Hemoglobin 12.9 (L) 13.0 - 17.0 g/dL   HCT 38.0 (L) 39.0 - 52.0 %  CBG monitoring, ED     Status: Abnormal   Collection Time: 01/21/16 10:35 AM  Result Value Ref Range   Glucose-Capillary 107 (H) 65 - 99 mg/dL  Urine rapid drug screen (hosp performed)     Status: None   Collection Time: 01/21/16  5:52 PM  Result Value Ref Range  Opiates NONE DETECTED NONE  DETECTED   Cocaine NONE DETECTED NONE DETECTED   Benzodiazepines NONE DETECTED NONE DETECTED   Amphetamines NONE DETECTED NONE DETECTED   Tetrahydrocannabinol NONE DETECTED NONE DETECTED   Barbiturates NONE DETECTED NONE DETECTED    Comment:        DRUG SCREEN FOR MEDICAL PURPOSES ONLY.  IF CONFIRMATION IS NEEDED FOR ANY PURPOSE, NOTIFY LAB WITHIN 5 DAYS.        LOWEST DETECTABLE LIMITS FOR URINE DRUG SCREEN Drug Class       Cutoff (ng/mL) Amphetamine      1000 Barbiturate      200 Benzodiazepine   680 Tricyclics       881 Opiates          300 Cocaine          300 THC              50   Ferritin     Status: None   Collection Time: 01/21/16  8:10 PM  Result Value Ref Range   Ferritin 200 24 - 336 ng/mL  Iron and TIBC     Status: Abnormal   Collection Time: 01/21/16  8:10 PM  Result Value Ref Range   Iron 33 (L) 45 - 182 ug/dL   TIBC 186 (L) 250 - 450 ug/dL   Saturation Ratios 18 17.9 - 39.5 %   UIBC 153 ug/dL  Basic metabolic panel     Status: Abnormal   Collection Time: 01/22/16  5:30 AM  Result Value Ref Range   Sodium 132 (L) 135 - 145 mmol/L   Potassium 4.8 3.5 - 5.1 mmol/L   Chloride 100 (L) 101 - 111 mmol/L   CO2 26 22 - 32 mmol/L   Glucose, Bld 112 (H) 65 - 99 mg/dL   BUN 32 (H) 6 - 20 mg/dL   Creatinine, Ser 6.06 (H) 0.61 - 1.24 mg/dL   Calcium 7.3 (L) 8.9 - 10.3 mg/dL   GFR calc non Af Amer 9 (L) >60 mL/min   GFR calc Af Amer 11 (L) >60 mL/min    Comment: (NOTE) The eGFR has been calculated using the CKD EPI equation. This calculation has not been validated in all clinical situations. eGFR's persistently <60 mL/min signify possible Chronic Kidney Disease.    Anion gap 6 5 - 15  CBC     Status: Abnormal   Collection Time: 01/22/16  5:30 AM  Result Value Ref Range   WBC 4.2 4.0 - 10.5 K/uL   RBC 3.51 (L) 4.22 - 5.81 MIL/uL   Hemoglobin 9.4 (L) 13.0 - 17.0 g/dL    Comment: SPECIMEN CHECKED FOR CLOTS REPEATED TO VERIFY    HCT 28.4 (L) 39.0 - 52.0 %    MCV 80.9 78.0 - 100.0 fL   MCH 26.8 26.0 - 34.0 pg   MCHC 33.1 30.0 - 36.0 g/dL   RDW 15.1 11.5 - 15.5 %   Platelets 314 150 - 400 K/uL  Urinalysis, Routine w reflex microscopic (not at [facility])     Status: Abnormal   Collection Time: 01/22/16  8:08 AM  Result Value Ref Range   Color, Urine YELLOW YELLOW   APPearance CLEAR CLEAR   Specific Gravity, Urine 1.005 1.005 - 1.030   pH 7.5 5.0 - 8.0   Glucose, UA NEGATIVE NEGATIVE mg/dL   Hgb urine dipstick SMALL (A) NEGATIVE   Bilirubin Urine NEGATIVE NEGATIVE   Ketones, ur NEGATIVE NEGATIVE mg/dL   Protein, ur >300 (A)  NEGATIVE mg/dL   Nitrite NEGATIVE NEGATIVE   Leukocytes, UA NEGATIVE NEGATIVE  Urine microscopic-add on     Status: Abnormal   Collection Time: 01/22/16  8:08 AM  Result Value Ref Range   Squamous Epithelial / LPF 0-5 (A) NONE SEEN   WBC, UA 0-5 0 - 5 WBC/hpf   RBC / HPF 0-5 0 - 5 RBC/hpf   Bacteria, UA RARE (A) NONE SEEN   Casts GRANULAR CAST (A) NEGATIVE     ROS:  A comprehensive review of systems was negative except for: Eyes: positive for visual disturbance also lower extremity edema  Physical Exam: Filed Vitals:   01/22/16 1100 01/22/16 1140  BP: 193/91 180/92  Pulse: 73 67  Temp:  97.7 F (36.5 C)  Resp:  18     General: Pleasant black male who is alert, oriented and in no acute distress HEENT: Pupils are equally round reactive to light, extra motions are intact, mucous members are moist Neck: There does seem to be JVD Heart: Regular rate and rhythm without murmur Lungs: Slightly decreased breath sounds at the bases Abdomen: Soft, nontender, nondistended Extremities: 2+ pitting edema to the level of the knees Skin: Warm and dry Neuro: Alert and oriented. Grossly nonfocal  Assessment/Plan: 60 year old black male with advanced chronic kidney disease with proteinuria in the setting of long-standing poorly controlled hypertension which is likely the cause. He was noted to have a GFR in the teens in the  recent past. 1.Renal- advanced chronic kidney disease evaluated by a nephrologist. From what he tells me I don't believe his kidney function is not much different than what it has been. Unfortunately, his chronic kidney disease is quite advanced and he is just barely over the threshold of being dialysis requiring. He does seem to understand this. He is not having any uremic symptoms at this time that would necessitate the initiation of RRT.  2. Hypertension/volume  - presenting with what appears to be a hypertensive urgency. I get the impression that he's had long-standing poorly controlled blood pressure. This is likely the etiology of this CKD. I agree with not reinitiating his ARB. He is volume overloaded and that is likely driving some of this. I'm going to stop the IV fluids - okay to continue low-dose clonidine at this time. I'm going to change and his beta blocker something that gives a little bit more impressive blood pressure control and ad lasix. 3. Anemia  - is likely related to his CKD. His iron stores are low as well. Will give IV iron and start an ESA at this time 4. Bones- we'll check a PTH and phosphorus and treat as needed 5. Dispo- patient tells me that he's going to be moving to this area. As far as the goals of this hospitalization will be to get blood pressure under reasonable control without dropping it too much. Hopefully we can do this without significant changes to his kidney function. I will go ahead and make follow-up appointment with me because unfortunately we need to proceed with transplant and dialysis planning fairly soon and make sure that he doesn't fall through the cracks   Shinika Estelle A 01/22/2016, 12:06 PM

## 2016-01-22 NOTE — Consult Note (Signed)
NEURO HOSPITALIST CONSULT NOTE   Requesting physician: Dr. Lala Lund, MD   Reason for Consult: New stroke seen on MRI.   HPI:                                                                                                                                          Allen Miller is an 60 y.o. male who initially presented yesterday with a c/c of blurred vision in his left eye. There was no loss of vision, just blurring. He has a prior history of malignant HTN and renal disease. His BP was noted to be severely elevated in the ED at 200/100. Worsened renal function was also noted, with Cr of 6. He was admitted for hypertensive crisis. Because his visual blurring persisted with treatment of his HTN, an MRI brain was ordered. The MRI revealed scattered acute infarctions in the left frontal parietal cortex and white matter exhibiting a distribution suggestive of MCA/ACA and MCA/PCA distribution watershed infarctions. In this context, there was asymmetric appearance of the left supraclinoid ICA which could reflect stenotic lesion; this MRA finding further increases the likelihood of watershed infarction as the underlying etiology for the strokes seen on MRI.   Past Medical History  Diagnosis Date  . Hypertension     History reviewed. No pertinent past surgical history.  No family history on file.  Social History:  reports that he has never smoked. He does not have any smokeless tobacco history on file. His alcohol and drug histories are not on file.  No Known Allergies  MEDICATIONS:                                                                                                                     Medication list reviewed.    ROS:  History obtained from patient    Denies chest and abdominal pain with no nausea. Denies headache or weakness.  Other ROS as per HPI.   Blood pressure 187/81, pulse 61, temperature 97.7 F (36.5 C), temperature source Oral, resp. rate 18, height 5\' 4"  (1.626 m), weight 79.017 kg (174 lb 3.2 oz), SpO2 100 %.   Neurologic Examination:                                                                                                      HEENT-  Normocephalic, no lesions, without obvious abnormality.   Extremities- Warm and well-perfused. Peripheral edema noted.   Neurological Examination Mental Status: Alert, oriented, thought content appropriate.  Speech fluent without evidence of aphasia.  Able to follow all commands without difficulty. Cranial Nerves: II: Visual fields normal OD. Central scotoma OS with sparing of peripheral vision. PERRL. III,IV, VI: ptosis not present, EOMI, no nystagmus V,VII: smile symmetric, decreased fine touch sensation to left face, sparing forehead. VIII: hearing intact to conversation. IX,X: no hypophonia or hoarseness XI: bilateral shoulder shrug equal XII: midline tongue extension Motor: Right : Upper extremity   5/5    Left:     Upper extremity   5/5  Lower extremity   5/5     Lower extremity   5/5 Tone and bulk:normal tone throughout; no atrophy noted Sensory: Light touch intact throughout without extinction.  Deep Tendon Reflexes: 2+ and symmetric in upper extremities. 0 at patellae and achilles tendons bilaterally Plantars: Right: downgoing   Left: downgoing Cerebellar: normal finger-to-nose Gait: Deferred  Lab Results: Basic Metabolic Panel:  Recent Labs Lab 01/21/16 0905 01/21/16 0915 01/22/16 0530  NA 135 134* 132*  K 4.1 4.1 4.8  CL 99* 96* 100*  CO2 24  --  26  GLUCOSE 109* 103* 112*  BUN 30* 31* 32*  CREATININE 6.03* 5.80* 6.06*  CALCIUM 7.9*  --  7.3*    Liver Function Tests:  Recent Labs Lab 01/21/16 0905  AST 19  ALT 21  ALKPHOS 81  BILITOT 0.3  PROT 5.9*  ALBUMIN 2.6*   No results for input(s): LIPASE, AMYLASE in the last 168  hours. No results for input(s): AMMONIA in the last 168 hours.  CBC:  Recent Labs Lab 01/21/16 0905 01/21/16 0915 01/22/16 0530  WBC 5.0  --  4.2  NEUTROABS 3.0  --   --   HGB 11.0* 12.9* 9.4*  HCT 34.1* 38.0* 28.4*  MCV 79.5  --  80.9  PLT 415*  --  314    Cardiac Enzymes: No results for input(s): CKTOTAL, CKMB, CKMBINDEX, TROPONINI in the last 168 hours.  Lipid Panel: No results for input(s): CHOL, TRIG, HDL, CHOLHDL, VLDL, LDLCALC in the last 168 hours.  CBG:  Recent Labs Lab 01/21/16 [place]    Microbiology: No results found for this or any previous visit.  Coagulation Studies:  Recent Labs  01/21/16 0905  LABPROT 13.3  INR 0.99    Imaging: Ct Head Wo Contrast  01/21/2016  CLINICAL DATA:  Blurred vision in the  left eye since yesterday. Improving today. Posterior headache left side since yesterday EXAM: CT HEAD WITHOUT CONTRAST TECHNIQUE: Contiguous axial images were obtained from the base of the skull through the vertex without intravenous contrast. COMPARISON:  None. FINDINGS: Old appearing posterior left parietal infarct. Mild chronic small vessel disease throughout the deep white matter. Old right basal ganglia lacunar infarcts. No acute intracranial abnormality. Specifically, no hemorrhage, hydrocephalus, mass lesion, acute infarction, or significant intracranial injury. No acute calvarial abnormality. Visualized paranasal sinuses and mastoids clear. Orbital soft tissues unremarkable. IMPRESSION: Old right basal ganglia and left posterior parietal infarcts. Chronic small vessel disease. No acute intracranial abnormality. Electronically Signed   By: Rolm Baptise M.D.   On: 01/21/2016 09:59   Mr Brain Wo Contrast  01/22/2016  CLINICAL DATA:  CVA with blurry vision in the left eye. EXAM: MRI HEAD WITHOUT CONTRAST TECHNIQUE: Multiplanar, multiecho pulse sequences of the brain and surrounding structures were obtained without intravenous contrast.  COMPARISON:  Head CT from yesterday FINDINGS: Calvarium and upper cervical spine: No focal marrow signal abnormality. Orbits: Negative. Sinuses and Mastoids: Clear. Brain: Patchy restricted diffusion in the left frontal parietal cortex and subcortical white matter at the vertex, extending in the parietal region to the periventricular white matter where there is the largest confluent area of infarct measuring 15 mm. These are in the MCA and posterior border zone territories. No acute superimposed hemorrhage (susceptibility artifact along the posterior body of the left lateral ventricle is small nodular calcification based on previous head CT. Major intracranial vessels show no signs of occlusion, but there is asymmetric heterogeneous signal of the left supraclinoid ICA seen on both axial and coronal T2 weighted imaging. There is a remote perforator infarct in the right putamen and genu internal capsule with wallerian degeneration in the right cortical spinal tract. Mild small vessel ischemic change in the bilateral pons. Patchy small vessel ischemic injury elsewhere in the cerebral white matter. Normal cerebral volume. No evidence of mass lesion or hydrocephalus. IMPRESSION: 1. Scattered acute infarction in the left frontal parietal cortex and white matter, MCA distribution. No hemorrhagic conversion. 2. Asymmetric appearance of the left supraclinoid ICA which could reflect stenotic lesion. Suggest MRA 3. Chronic small vessel disease and remote right basal ganglia infarct. Electronically Signed   By: Monte Fantasia M.D.   On: 01/22/2016 16:22   US Renal  01/22/2016  CLINICAL DATA:  Acute renal failure, hypertension EXAM: RENAL / URINARY TRACT ULTRASOUND COMPLETE COMPARISON:  None FINDINGS: Right Kidney: Length: 10.6 cm. Slightly increased cortical echogenicity. Normal cortical thickness. No mass, hydronephrosis or shadowing calcification. Left Kidney: Length: 11.4 cm. Normal cortical thickness. Increased cortical  echogenicity. Tiny exophytic probable cyst 9 x 8 x 7 mm at upper pole. No additional mass, hydronephrosis or shadowing calcification. Bladder: Appears normal for degree of bladder distention. Prostate gland appears enlarged, 4.3 x 4.6 x 3.2 cm. IMPRESSION: Prostatic enlargement. Suspected medical renal disease. Tiny probable LEFT renal cyst 9 mm diameter. Electronically Signed   By: Lavonia Dana M.D.   On: 01/22/2016 08:29    Assessment/Plan: 1. Acute left ACA/MCA and PCA/MCA watershed infarctions. Suspected stenosis of supraclinoid left ICA also consistent with watershed infarction as the underlying mechanism for the strokes seen on MRI.  2. Also noted on MRI is chronic small vessel disease and remote right basal ganglia infarct. 3. 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  Most likely secondary to cardioembolic mechanism or artery to artery embolization from possible carotid atherosclerotic disease. Retinal hemorrhage or residual papilledema in the context of the malignant hypertension diagnosed on admission, are also possible. 4. Hypocalcemia.  Recommendations: 1. MRA brain.  2. Carotid ultrasound 3. TEE to assess for possible mural thrombus not detectable on TTE.  4. Agree with starting ASA.  5. Start atorvastatin. Obtain baseline LFTs and CK level.  6. Opthalmology consult for dilated retinal exam OS.  7. Monitor and correct electrolytes as indicated 8. Telemetry to assess for possible intermittent atrial fibrillation.  Kerney Elbe, MD 01/22/2016, 5:14 PM

## 2016-01-22 NOTE — Telephone Encounter (Signed)
This CM spoke to Jacqlyn Krauss, RN CM regarding  a hospital follow up appointment for the patient. Informed her that at this time, there are no available appointments. She then noted that the discharge date it yet to be confirmed.  Will assist with scheduling a follow up appointment at [facility] when discharge date determined.

## 2016-01-22 NOTE — Care Management Note (Addendum)
Case Management Note  Patient Details  Name: Soliman Puleio MRN: [id number] Date of Birth: 1956-03-07  Subjective/Objective:  Pt admitted for Hypertension Urgency. Pt is originally from [place] and wants to relocate to [place]. Pt has insurance without PCP. CM did discuss with pt in regards to calling the [facility]- Pt was agreeable to CM calling [facility]. Clinic was called and no new appointments available until 2nd week of March.                    Action/Plan: CM was able to contact Jane with Transitional Care at Clinic and she was going to try to get an appointment. CM will continue to f/u.    Expected Discharge Date:                  Expected Discharge Plan:  Home/Self Care  In-House Referral:  NA  Discharge planning Services  CM Consult, Follow-up appt scheduled, [facility], Medication Assistance  Post Acute Care Choice:  NA Choice offered to:  NA  DME Arranged:  N/A DME Agency:  NA  HH Arranged:  NA HH Agency:  NA  Status of Service:  Completed, signed off  Medicare Important Message Given:    Date Medicare IM Given:    Medicare IM give by:    Date Additional Medicare IM Given:    Additional Medicare Important Message give by:     If discussed at [place] of Stay Meetings, dates discussed:    Additional Comments: [place], RN,BSN [phone] [facility] Liaison did assist with [facility] hospital f/u appointment. No further needs from CM at this time.     1331 01-24-16 Jacqlyn Krauss, RN,BSN [phone] CM was unable to set up a Hospital f/u for the patient. Pt is new to area. CM did place on the AVS for patient to call the [facility] for Hospital F/u to the clinic the 2nd week in March. No further needs at this time.   Bethena Roys, RN 01/22/2016, 1:42 PM

## 2016-01-22 NOTE — Progress Notes (Addendum)
Patient Demographics:    Allen Miller, is a 60 y.o. male, DOB - 1956-10-22, [id number]  Admit date - 01/21/2016   Admitting Physician Waldemar Dickens, MD  Outpatient Primary MD for the patient is No primary care provider on file.  LOS -    Chief Complaint  Patient presents with  . Visual Field Change        Subjective:    Allen Miller today has, No headache, No chest pain, No abdominal pain - No Nausea, No new weakness tingling or numbness, No Cough - SOB.     Assessment  & Plan :     1. Hypertensive emergency. Blood pressure has stabilized on current regimen of beta blocker and clonidine, and it has needed hydralazine. Continue to monitor blood pressure. Will try to bring down gently.  2. ARF on CK D stage V. Baseline creatinine appears to be close to 4.5. Currently 6.5. Renal ultrasound nonacute, UA consistent with large proteinuria and some hematuria, nephrology consulted. Hold ARB for now. Does have edema. Might have to discontinue fluids and diurese. We'll defer to renal.  3. Left eye blurry vision, H/O CVA. Started sometime yesterday afternoon around 24 hours ago, head CT nonacute, obtain MRI brain along with lipid panel and A1c, could be due to hypertensive emergency. Place on aspirin for now.   Addendum 4.50pm - MRI shows CVA, Neuro called, cut back on BP meds.   4. BPH along with small left renal cyst. Outpatient urology follow-up.  5.AOCD - stable.    Code Status : Full  Family Communication  : Son bedside sleeping  Disposition Plan  : Remain inpatient  Consults  : Renal  Procedures  :   Renal ultrasound. Showing medical renal disease, tiny left renal cyst.  DVT Prophylaxis  :   Heparin   Lab Results  Component Value Date   PLT 314 01/22/2016    Inpatient  Medications  Scheduled Meds: . cloNIDine  0.1 mg Oral TID  . heparin  5,000 Units Subcutaneous 3 times per day  . LORazepam  1 mg Intravenous Once  . metoprolol tartrate  25 mg Oral BID  . sodium chloride flush  3 mL Intravenous Q12H   Continuous Infusions: . sodium chloride     PRN Meds:.acetaminophen **OR** [DISCONTINUED] acetaminophen, hydrALAZINE, HYDROcodone-acetaminophen, [DISCONTINUED] ondansetron **OR** ondansetron (ZOFRAN) IV, senna-docusate  Antibiotics  :     Anti-infectives    None        Objective:   Filed Vitals:   01/22/16 0400 01/22/16 0500 01/22/16 0600 01/22/16 0835  BP: 205/98 166/92 123/67 186/88  Pulse: 78 71 71 75  Temp:  98 F (36.7 C)  99 F (37.2 C)  TempSrc:  Oral  Oral  Resp:  16  18  Height:      Weight:  79.017 kg (174 lb 3.2 oz)    SpO2: 100% 100% 100% 100%    Wt Readings from Last 3 Encounters:  01/22/16 79.017 kg (174 lb 3.2 oz)     Intake/Output Summary (Last 24 hours) at 01/22/16 1044 Last data filed at 01/22/16 0800  Gross per 24 hour  Intake      0 ml  Output    525 ml  Net   -  525 ml     Physical Exam  Awake Alert, Oriented X 3, No new F.N deficits, Normal affect [name],PERRAL Supple Neck,No JVD, No cervical lymphadenopathy appriciated.  Symmetrical Chest wall movement, Good air movement bilaterally, CTAB RRR,No Gallops,Rubs or new Murmurs, No Parasternal Heave +ve B.Sounds, Abd Soft, No tenderness, No organomegaly appriciated, No rebound - guarding or rigidity. No Cyanosis, Clubbing, 2+ edema, No new Rash or bruise       Data Review:   Micro Results No results found for this or any previous visit (from the past 240 hour(s)).  Radiology Reports Ct Head Wo Contrast  01/21/2016  CLINICAL DATA:  Blurred vision in the left eye since yesterday. Improving today. Posterior headache left side since yesterday EXAM: CT HEAD WITHOUT CONTRAST TECHNIQUE: Contiguous axial images were obtained from the base of the skull through  the vertex without intravenous contrast. COMPARISON:  None. FINDINGS: Old appearing posterior left parietal infarct. Mild chronic small vessel disease throughout the deep white matter. Old right basal ganglia lacunar infarcts. No acute intracranial abnormality. Specifically, no hemorrhage, hydrocephalus, mass lesion, acute infarction, or significant intracranial injury. No acute calvarial abnormality. Visualized paranasal sinuses and mastoids clear. Orbital soft tissues unremarkable. IMPRESSION: Old right basal ganglia and left posterior parietal infarcts. Chronic small vessel disease. No acute intracranial abnormality. Electronically Signed   By: Rolm Baptise M.D.   On: 01/21/2016 09:59   US Renal  01/22/2016  CLINICAL DATA:  Acute renal failure, hypertension EXAM: RENAL / URINARY TRACT ULTRASOUND COMPLETE COMPARISON:  None FINDINGS: Right Kidney: Length: 10.6 cm. Slightly increased cortical echogenicity. Normal cortical thickness. No mass, hydronephrosis or shadowing calcification. Left Kidney: Length: 11.4 cm. Normal cortical thickness. Increased cortical echogenicity. Tiny exophytic probable cyst 9 x 8 x 7 mm at upper pole. No additional mass, hydronephrosis or shadowing calcification. Bladder: Appears normal for degree of bladder distention. Prostate gland appears enlarged, 4.3 x 4.6 x 3.2 cm. IMPRESSION: Prostatic enlargement. Suspected medical renal disease. Tiny probable LEFT renal cyst 9 mm diameter. Electronically Signed   By: Lavonia Dana M.D.   On: 01/22/2016 08:29     CBC  Recent Labs Lab 01/21/16 0905 01/21/16 0915 01/22/16 0530  WBC 5.0  --  4.2  HGB 11.0* 12.9* 9.4*  HCT 34.1* 38.0* 28.4*  PLT 415*  --  314  MCV 79.5  --  80.9  MCH 25.6*  --  26.8  MCHC 32.3  --  33.1  RDW 14.6  --  15.1  LYMPHSABS 1.2  --   --   MONOABS 0.4  --   --   EOSABS 0.3  --   --   BASOSABS 0.0  --   --     Chemistries   Recent Labs Lab 01/21/16 0905 01/21/16 0915 01/22/16 0530  NA 135 134*  132*  K 4.1 4.1 4.8  CL 99* 96* 100*  CO2 24  --  26  GLUCOSE 109* 103* 112*  BUN 30* 31* 32*  CREATININE 6.03* 5.80* 6.06*  CALCIUM 7.9*  --  7.3*  AST 19  --   --   ALT 21  --   --   ALKPHOS 81  --   --   BILITOT 0.3  --   --    ------------------------------------------------------------------------------------------------------------------ No results for input(s): CHOL, HDL, LDLCALC, TRIG, CHOLHDL, LDLDIRECT in the last 72 hours.  No results found for: HGBA1C ------------------------------------------------------------------------------------------------------------------ No results for input(s): TSH, T4TOTAL, T3FREE, THYROIDAB in the last 72 hours.  Invalid input(s): FREET3 ------------------------------------------------------------------------------------------------------------------  Recent Labs  01/21/16 2010  FERRITIN 200  TIBC 186*  IRON 33*    Coagulation profile  Recent Labs Lab 01/21/16 0905  INR 0.99    No results for input(s): DDIMER in the last 72 hours.  Cardiac Enzymes No results for input(s): CKMB, TROPONINI, MYOGLOBIN in the last 168 hours.  Invalid input(s): CK ------------------------------------------------------------------------------------------------------------------ No results found for: BNP  Time Spent in minutes  35   SINGH,PRASHANT K M.D on 01/22/2016 at 10:44 AM  Between 7am to 7pm - Pager - [phone]  After 7pm go to [email or web address] - password [facility]  [facility] -  Office  [phone]

## 2016-01-23 ENCOUNTER — Observation Stay (HOSPITAL_COMMUNITY): Payer: BLUE CROSS/BLUE SHIELD

## 2016-01-23 DIAGNOSIS — H539 Unspecified visual disturbance: Secondary | ICD-10-CM | POA: Diagnosis not present

## 2016-01-23 DIAGNOSIS — I161 Hypertensive emergency: Secondary | ICD-10-CM | POA: Diagnosis not present

## 2016-01-23 DIAGNOSIS — N179 Acute kidney failure, unspecified: Secondary | ICD-10-CM | POA: Diagnosis not present

## 2016-01-23 DIAGNOSIS — Z8673 Personal history of transient ischemic attack (TIA), and cerebral infarction without residual deficits: Secondary | ICD-10-CM | POA: Diagnosis not present

## 2016-01-23 LAB — RENAL FUNCTION PANEL
ANION GAP: 7 (ref 5–15)
Albumin: 2.1 g/dL — ABNORMAL LOW (ref 3.5–5.0)
BUN: 33 mg/dL — AB (ref 6–20)
CALCIUM: 7.6 mg/dL — AB (ref 8.9–10.3)
CO2: 26 mmol/L (ref 22–32)
Chloride: 98 mmol/L — ABNORMAL LOW (ref 101–111)
Creatinine, Ser: 5.94 mg/dL — ABNORMAL HIGH (ref 0.61–1.24)
GFR calc Af Amer: 11 mL/min — ABNORMAL LOW (ref >60)
GFR calc non Af Amer: 9 mL/min — ABNORMAL LOW (ref >60)
GLUCOSE: 99 mg/dL (ref 65–99)
POTASSIUM: 5.7 mmol/L — AB (ref 3.5–5.1)
Phosphorus: 4.4 mg/dL (ref 2.5–4.6)
SODIUM: 131 mmol/L — AB (ref 135–145)

## 2016-01-23 LAB — HEMOGLOBIN A1C
Hgb A1c MFr Bld: 5.6 % (ref 4.8–5.6)
MEAN PLASMA GLUCOSE: 114 mg/dL

## 2016-01-23 LAB — URINE CULTURE

## 2016-01-23 LAB — LIPID PANEL
CHOL/HDL RATIO: 3.7 ratio
Cholesterol: 164 mg/dL (ref 0–200)
HDL: 44 mg/dL (ref >40)
LDL Cholesterol: 88 mg/dL (ref 0–99)
Triglycerides: 158 mg/dL — ABNORMAL HIGH (ref <150)
VLDL: 32 mg/dL (ref 0–40)

## 2016-01-23 MED ORDER — CLOPIDOGREL BISULFATE 75 MG PO TABS
75.0000 mg | ORAL_TABLET | Freq: Every day | ORAL | Status: DC
  Administered 2016-01-23 – 2016-01-24 (×2): 75 mg via ORAL
  Filled 2016-01-23 (×3): qty 1

## 2016-01-23 MED ORDER — ATORVASTATIN CALCIUM 40 MG PO TABS
40.0000 mg | ORAL_TABLET | Freq: Every day | ORAL | Status: DC
  Administered 2016-01-23: 40 mg via ORAL
  Filled 2016-01-23: qty 1

## 2016-01-23 MED ORDER — SODIUM POLYSTYRENE SULFONATE 15 GM/60ML PO SUSP
30.0000 g | Freq: Once | ORAL | Status: AC
  Administered 2016-01-23: 30 g via ORAL
  Filled 2016-01-23: qty 120

## 2016-01-23 NOTE — Progress Notes (Signed)
Patient Demographics:    Allen Miller, is a 60 y.o. male, DOB - [birth year], [id number]  Admit date - 01/21/2016   Admitting Physician Allen Dickens, MD  Outpatient Primary MD for the patient is No primary care provider on file.  LOS -    Chief Complaint  Patient presents with  . Visual Field Change        Subjective:    Allen Miller today has, No headache, No chest pain, No abdominal pain - No Nausea, No new weakness tingling or numbness, No Cough - SOB.     Assessment  & Plan :     1. Hypertensive emergency. Initial impression upon admission, in hindsight patient spent pressure was high due to stroke, manage blood pressure per stroke parameters.  2. ARF on CK D stage V. Baseline creatinine appears to be close to 4.5. Currently 6.5. Renal ultrasound nonacute, UA consistent with large proteinuria and some hematuria, nephrology consulted. Hold ARB for now. Does have edema. Renal following and managing diuresis and IV fluids.  3. Left eye blurry vision, H/O CVA. Started 12-14 hours prior to hospital visit, in the ER head CT was nonacute, MRI brain confirms possible embolic stroke, neurology on board, I have placed him on aspirin, LDL is 88 hence he will be placed on statin, A1c was 5.6. Full stroke workup per neurology.    4. BPH along with small left renal cyst. Outpatient urology follow-up.  5.AOCD - stable.    Code Status : Full  Family Communication  : Son bedside sleeping  Disposition Plan  : Remain inpatient  Consults  : Renal  Procedures  :   Renal ultrasound. Showing medical renal disease, tiny left renal cyst.  CT head nonacute  MRI brain left frontoparietal embolic infarct in the left MCA territory  Echocardiogram  Carotid duplex  DVT Prophylaxis  :   Heparin     Lab Results  Component Value Date   PLT 314 01/22/2016    Inpatient Medications  Scheduled Meds: . aspirin  81 mg Oral Daily  . carvedilol  12.5 mg Oral BID WC  . ferric gluconate (FERRLECIT/NULECIT) IV  125 mg Intravenous Daily  . furosemide  80 mg Oral BID  . heparin  5,000 Units Subcutaneous 3 times per day  . LORazepam  1 mg Intravenous Once  . sodium chloride flush  3 mL Intravenous Q12H   Continuous Infusions:   PRN Meds:.acetaminophen **OR** [DISCONTINUED] acetaminophen, hydrALAZINE, HYDROcodone-acetaminophen, [DISCONTINUED] ondansetron **OR** ondansetron (ZOFRAN) IV, senna-docusate  Antibiotics  :     Anti-infectives    None        Objective:   Filed Vitals:   01/23/16 0045 01/23/16 0538 01/23/16 0541 01/23/16 0800  BP: 134/73 150/75  201/103  Pulse: 66 72    Temp: 98.4 F (36.9 C) 99 F (37.2 C)    TempSrc: Oral Oral    Resp:  16    Height:      Weight:   79.107 kg (174 lb 6.4 oz)   SpO2: 100% 99%  100%    Wt Readings from Last 3 Encounters:  01/23/16 79.107 kg (174 lb 6.4 oz)     Intake/Output Summary (Last 24 hours) at 01/23/16 1039 Last data filed at  01/23/16 1000  Gross per 24 hour  Intake    700 ml  Output   1650 ml  Net   -950 ml     Physical Exam  Awake Alert, Oriented X 3, No new F.N deficits, Normal affect [place].AT,PERRAL Supple Neck,No JVD, No cervical lymphadenopathy appriciated.  Symmetrical Chest wall movement, Good air movement bilaterally, CTAB RRR,No Gallops,Rubs or new Murmurs, No Parasternal Heave +ve B.Sounds, Abd Soft, No tenderness, No organomegaly appriciated, No rebound - guarding or rigidity. No Cyanosis, Clubbing, 2+ edema, No new Rash or bruise       Data Review:   Micro Results No results found for this or any previous visit (from the past 240 hour(s)).  Radiology Reports Ct Head Wo Contrast  01/21/2016  CLINICAL DATA:  Blurred vision in the left eye since yesterday. Improving today. Posterior headache  left side since yesterday EXAM: CT HEAD WITHOUT CONTRAST TECHNIQUE: Contiguous axial images were obtained from the base of the skull through the vertex without intravenous contrast. COMPARISON:  None. FINDINGS: Old appearing posterior left parietal infarct. Mild chronic small vessel disease throughout the deep white matter. Old right basal ganglia lacunar infarcts. No acute intracranial abnormality. Specifically, no hemorrhage, hydrocephalus, mass lesion, acute infarction, or significant intracranial injury. No acute calvarial abnormality. Visualized paranasal sinuses and mastoids clear. Orbital soft tissues unremarkable. IMPRESSION: Old right basal ganglia and left posterior parietal infarcts. Chronic small vessel disease. No acute intracranial abnormality. Electronically Signed   By: Rolm Baptise M.D.   On: 01/21/2016 09:59   Mr Brain Wo Contrast  01/22/2016  CLINICAL DATA:  CVA with blurry vision in the left eye. EXAM: MRI HEAD WITHOUT CONTRAST TECHNIQUE: Multiplanar, multiecho pulse sequences of the brain and surrounding structures were obtained without intravenous contrast. COMPARISON:  Head CT from yesterday FINDINGS: Calvarium and upper cervical spine: No focal marrow signal abnormality. Orbits: Negative. Sinuses and Mastoids: Clear. Brain: Patchy restricted diffusion in the left frontal parietal cortex and subcortical white matter at the vertex, extending in the parietal region to the periventricular white matter where there is the largest confluent area of infarct measuring 15 mm. These are in the MCA and posterior border zone territories. No acute superimposed hemorrhage (susceptibility artifact along the posterior body of the left lateral ventricle is small nodular calcification based on previous head CT. Major intracranial vessels show no signs of occlusion, but there is asymmetric heterogeneous signal of the left supraclinoid ICA seen on both axial and coronal T2 weighted imaging. There is a remote  perforator infarct in the right putamen and genu internal capsule with wallerian degeneration in the right cortical spinal tract. Mild small vessel ischemic change in the bilateral pons. Patchy small vessel ischemic injury elsewhere in the cerebral white matter. Normal cerebral volume. No evidence of mass lesion or hydrocephalus. IMPRESSION: 1. Scattered acute infarction in the left frontal parietal cortex and white matter, MCA distribution. No hemorrhagic conversion. 2. Asymmetric appearance of the left supraclinoid ICA which could reflect stenotic lesion. Suggest MRA 3. Chronic small vessel disease and remote right basal ganglia infarct. Electronically Signed   By: Monte Fantasia M.D.   On: 01/22/2016 16:22   US Renal  01/22/2016  CLINICAL DATA:  Acute renal failure, hypertension EXAM: RENAL / URINARY TRACT ULTRASOUND COMPLETE COMPARISON:  None FINDINGS: Right Kidney: Length: 10.6 cm. Slightly increased cortical echogenicity. Normal cortical thickness. No mass, hydronephrosis or shadowing calcification. Left Kidney: Length: 11.4 cm. Normal cortical thickness. Increased cortical echogenicity. Tiny exophytic probable cyst 9 x  8 x 7 mm at upper pole. No additional mass, hydronephrosis or shadowing calcification. Bladder: Appears normal for degree of bladder distention. Prostate gland appears enlarged, 4.3 x 4.6 x 3.2 cm. IMPRESSION: Prostatic enlargement. Suspected medical renal disease. Tiny probable LEFT renal cyst 9 mm diameter. Electronically Signed   By: Lavonia Dana M.D.   On: 01/22/2016 08:29     CBC  Recent Labs Lab 01/21/16 0905 01/21/16 0915 01/22/16 0530  WBC 5.0  --  4.2  HGB 11.0* 12.9* 9.4*  HCT 34.1* 38.0* 28.4*  PLT 415*  --  314  MCV 79.5  --  80.9  MCH 25.6*  --  26.8  MCHC 32.3  --  33.1  RDW 14.6  --  15.1  LYMPHSABS 1.2  --   --   MONOABS 0.4  --   --   EOSABS 0.3  --   --   BASOSABS 0.0  --   --     Chemistries   Recent Labs Lab 01/21/16 0905 01/21/16 0915  01/22/16 0530 01/23/16 0500  NA 135 134* 132* 131*  K 4.1 4.1 4.8 5.7*  CL 99* 96* 100* 98*  CO2 24  --  26 26  GLUCOSE 109* 103* 112* 99  BUN 30* 31* 32* 33*  CREATININE 6.03* 5.80* 6.06* 5.94*  CALCIUM 7.9*  --  7.3* 7.6*  AST 19  --   --   --   ALT 21  --   --   --   ALKPHOS 81  --   --   --   BILITOT 0.3  --   --   --    ------------------------------------------------------------------------------------------------------------------  Recent Labs  01/23/16 0500  CHOL 164  HDL 44  LDLCALC 88  TRIG 158*  CHOLHDL 3.7    Lab Results  Component Value Date   HGBA1C 5.6 01/22/2016   ------------------------------------------------------------------------------------------------------------------ No results for input(s): TSH, T4TOTAL, T3FREE, THYROIDAB in the last 72 hours.  Invalid input(s): FREET3 ------------------------------------------------------------------------------------------------------------------  Recent Labs  01/21/16 2010  FERRITIN 200  TIBC 186*  IRON 33*    Coagulation profile  Recent Labs Lab 01/21/16 0905  INR 0.99    No results for input(s): DDIMER in the last 72 hours.  Cardiac Enzymes No results for input(s): CKMB, TROPONINI, MYOGLOBIN in the last 168 hours.  Invalid input(s): CK ------------------------------------------------------------------------------------------------------------------ No results found for: BNP  Time Spent in minutes  35   SINGH,PRASHANT K M.D on 01/23/2016 at 10:39 AM  Between 7am to 7pm - Pager - [phone]  After 7pm go to [email or web address] - password [facility]  [facility] -  Office  [phone]

## 2016-01-23 NOTE — Plan of Care (Signed)
Problem: Education: Goal: Knowledge of disease or condition will improve Outcome: Adequate for Discharge Pt given results of current testing. Pt educated on how maintaining diet and blood pressure control will help reduce reoccurrence of stroke

## 2016-01-23 NOTE — Progress Notes (Signed)
STROKE TEAM PROGRESS NOTE   HISTORY OF PRESENT ILLNESS Burak Custodio is a 60 y.o. male who initially presented yesterday with a c/c of blurred vision in his left eye. There was no loss of vision, just blurring. He has a prior history of malignant HTN and renal disease. His BP was noted to be severely elevated in the ED at 200/100. Worsened renal function was also noted, with Cr of 6. He was admitted for hypertensive crisis. Because his visual blurring persisted with treatment of his HTN, an MRI brain was ordered. The MRI revealed scattered acute infarctions in the left frontal parietal cortex and white matter exhibiting a distribution suggestive of MCA/ACA and MCA/PCA distribution watershed infarctions. In this context, there was asymmetric appearance of the left supraclinoid ICA which could reflect stenotic lesion; this MRA finding further increases the likelihood of watershed infarction as the underlying etiology for the strokes seen on MRI.    SUBJECTIVE (INTERVAL HISTORY) Eye is still blurry. He is not on daily aspirin at home. Just left eye vision changes. Denies any other symptoms.    OBJECTIVE Temp:  [98.4 F (36.9 C)-99 F (37.2 C)] 99 F (37.2 C) (02/23 0538) Pulse Rate:  [61-72] 72 (02/23 0538) Cardiac Rhythm:  [-] Normal sinus rhythm (02/23 0939) Resp:  [16] 16 (02/23 0538) BP: (134-203)/(65-103) 203/97 mmHg (02/23 1100) SpO2:  [99 %-100 %] 100 % (02/23 0800) Weight:  [79.107 kg (174 lb 6.4 oz)] 79.107 kg (174 lb 6.4 oz) (02/23 0541)  CBC:  Recent Labs Lab 01/21/16 0905 01/21/16 0915 01/22/16 0530  WBC 5.0  --  4.2  NEUTROABS 3.0  --   --   HGB 11.0* 12.9* 9.4*  HCT 34.1* 38.0* 28.4*  MCV 79.5  --  80.9  PLT 415*  --  [id number]    Basic Metabolic Panel:  Recent Labs Lab 01/22/16 0530 01/23/16 0500  NA 132* 131*  K 4.8 5.7*  CL 100* 98*  CO2 26 26  GLUCOSE 112* 99  BUN 32* 33*  CREATININE 6.06* 5.94*  CALCIUM 7.3* 7.6*  PHOS  --  4.4    Lipid Panel:     Component Value Date/Time   CHOL 164 01/23/2016 0500   TRIG 158* 01/23/2016 0500   HDL 44 01/23/2016 0500   CHOLHDL 3.7 01/23/2016 0500   VLDL 32 01/23/2016 0500   LDLCALC 88 01/23/2016 0500   HgbA1c:  Lab Results  Component Value Date   HGBA1C 5.6 01/22/2016   Urine Drug Screen:    Component Value Date/Time   LABOPIA NONE DETECTED 01/21/2016 1752   COCAINSCRNUR NONE DETECTED 01/21/2016 1752   LABBENZ NONE DETECTED 01/21/2016 1752   AMPHETMU NONE DETECTED 01/21/2016 1752   THCU NONE DETECTED 01/21/2016 1752   LABBARB NONE DETECTED 01/21/2016 1752      IMAGING  Mr Brain Wo Contrast 01/22/2016    Patchy restricted diffusion in the left frontal parietal cortex and subcortical white matter at the vertex, extending in the parietal region to the periventricular white matter where there is the largest confluent area of infarct measuring 15 mm. These are in the MCA and posterior border zone territories. No acute superimposed hemorrhage (susceptibility artifact along the posterior body of the left lateral ventricle is small nodular calcification based on previous head CT. Major intracranial vessels show no signs of occlusion, but there is asymmetric heterogeneous signal of the left supraclinoid ICA seen on both axial and coronal T2 weighted imaging. There is a remote perforator infarct in the right  putamen and genu internal capsule with wallerian degeneration in the right cortical spinal tract. Mild small vessel ischemic change in the bilateral pons. Patchy small vessel ischemic injury elsewhere in the cerebral white matter. Normal cerebral volume. No evidence of mass lesion or hydrocephalus.  IMPRESSION: 1. Scattered acute infarction in the left frontal parietal cortex and white matter, MCA distribution. No hemorrhagic conversion. 2. Asymmetric appearance of the left supraclinoid ICA which could reflect stenotic lesion. Suggest MRA 3. Chronic small vessel disease and remote right basal  ganglia infarct.   US Renal 01/22/2016   Prostatic enlargement. Suspected medical renal disease. Tiny probable LEFT renal cyst 9 mm diameter.     PHYSICAL EXAM  PHYSICAL EXAM Physical exam: Exam: Gen: NAD Eyes: anicteric sclerae, moist conjunctivae                    CV: no MRG, no carotid bruits, no peripheral edema Mental Status: Alert, follows commands, good historian  Neuro: Detailed Neurologic Exam  Speech:    No aphasia, no dysarthria  Cranial Nerves:    The pupils are equal, round, and reactive to light.[place] full to finger count peripherally but decreased centrally left eye. Attempted, Fundi not visualized.  EOMI. Conjugate gaze. Visual fields full. Face symmetric, Tongue midline. Hearing intact to voice. Shoulder shrug intact  Motor Observation:    no involuntary movements noted. Tone appears normal.     Strength:    5/5     Sensation: Intact to LT throughout   Plantars downgoing.   Gait: Normal  ASSESSMENT/PLAN Mr. Nicolaos Lashway is a 60 y.o. male with history of hypertension and renal disease presenting with malignant hypertension and blurred vision of the left eye. He did not receive IV t-PA due to late presentation. MRi showed patchy restricted diffusion in the left frontal parietal cortex and subcortical white matter at the vertex, extending in the parietal region to the periventricular white matter where there is the largest confluent area of infarct measuring 15 mm. These are in the MCA and posterior border zone territories.   Strokes:  Dominant infarcts embolic secondary to supraclinoid internal carotid artery disease.  Resultant  Left eye Visual changes  MRI Scattered acute infarction in the left frontal parietal cortex and white matter, MCA distribution.  MRA  severe bilateral supraclinoid ICA stenosis  Carotid Doppler - Cancel, MRA of the neck performed. No carotid stenosis  in the neck. Up to moderate proximal left vertebral artery  stenosis in the  neck. Mild to moderate irregularity and stenosis in the distal left PCA  Needs Transcranial Doppler and Bubble Studies outpatinet  2D Echo - LV EF: 50% -  55%  LDL 88  HgbA1c 5.6  VTE prophylaxis - subcutaneous heparin  Diet renal with fluid restriction Fluid restriction:: 1800 mL Fluid; Room service appropriate?: Yes; Fluid consistency:: Thin  No antithrombotic prior to admission, now on aspirin 81 mg daily   Will change to Dual antiplatelet therapy for 3 months and then aspirin daily with  follow up in neurology in 2 months with Dr. Leonie Man.   Patient counseled to be compliant with his antithrombotic medications  Ongoing aggressive stroke risk factor management  Therapy recommendations:  Pending  Disposition:  Pending  Hypertension  Blood pressure elevated  Permissive hypertension (OK if < 220/120) but gradually normalize in 5-7 days  Hyperlipidemia  Home meds:  No lipid lowering medications prior to admission.  LDL 88, goal < 70  Currently on Lipitor 40 mg  daily  Continue statin at discharge    Other Stroke Risk Factors  Obesity, Body mass index is 29.92 kg/(m^2).   Hx stroke/TIA - by MRI    Other Active Problems  Anemia  Hyperkalemia  Hyponatremia  Renal failure   Personally examined patient and images, and have participated in and made any corrections needed to history, physical, neuro exam,assessment and plan as stated above.  I have personally obtained the history, evaluated lab date, reviewed imaging studies and agree with radiology interpretations.  Stroke team with sign off at this time.  Will change to Dual antiplatelet therapy for 3 months and then aspirin daily with  follow up in neurology in 2 months with Dr. Leonie Man. Needs Transcranial Doppler and Bubble Studies outpatient. Stroke team will sign off, do not hesitate to call for any questions or concerns.  Sarina Ill, MD Stroke Neurology TD:[phone] (cell  phone) [facility]        To contact Stroke Continuity provider, please refer to [email or web address] After hours, contact General Neurology

## 2016-01-23 NOTE — Plan of Care (Signed)
Problem: Education: Goal: Knowledge of [facility] Education information/materials will improve Outcome: Progressing Patient aware of plan of care.  Patient has denied pain thus far this shift.  Medication education provided per RN prior to administration of medication(s).

## 2016-01-23 NOTE — Progress Notes (Signed)
Subjective:  Feels the same- still with visual disturbance- MRI has now shown scattered acute infarcts thought to be watershed- BP has been up and down- clonidine now stopped in effort to keep blood pressure a little higher for now in the setting of watershed infarcts- kidney function stable- made 1600 of urine with lasix but K was high this AM Objective Vital signs in last 24 hours: Filed Vitals:   01/23/16 0045 01/23/16 0538 01/23/16 0541 01/23/16 0800  BP: 134/73 150/75  201/103  Pulse: 66 72    Temp: 98.4 F (36.9 C) 99 F (37.2 C)    TempSrc: Oral Oral    Resp:  16    Height:      Weight:   79.107 kg (174 lb 6.4 oz)   SpO2: 100% 99%  100%   Weight change: -0.272 kg (-9.6 oz)  Intake/Output Summary (Last 24 hours) at 01/23/16 0932 Last data filed at 01/23/16 [id number]  Gross per 24 hour  Intake    700 ml  Output   1350 ml  Net   -650 ml    Assessment/Plan: 60 year old black male with advanced chronic kidney disease with proteinuria in the setting of long-standing poorly controlled hypertension which is likely the cause. He was noted to have a GFR in the teens in the recent past. 1.Renal- advanced chronic kidney disease evaluated by a nephrologist. From what he tells me I don't believe his kidney function is not much different than what it has been. Unfortunately, his chronic kidney disease is quite advanced and he is just barely over the threshold of being dialysis requiring. He does seem to understand this. He is not having any uremic symptoms at this time that would necessitate the initiation of RRT.  2. Hypertension/volume - presenting with what appears to be a hypertensive urgency. I get the impression that he's had long-standing poorly controlled blood pressure. This is likely the etiology of this CKD. I agree with not reinitiating his ARB. He is volume overloaded and that is likely driving some of this.  I have changed and his beta blocker to coreg and added lasix- clonidine was  stopped to keep BP a little on the high side for now- has PRNS if it gets too high 3. Anemia - is likely related to his CKD. His iron stores are low as well. Will give IV iron and start an ESA  4. Bones- we'll check a PTH and phosphorus and treat as needed- phos 4.4- no binder 5. Dispo- patient tells me that he's going to be moving to this area. As far as the goals of this hospitalization will be to get blood pressure under reasonable control without dropping it too much. Hopefully we can do this without significant changes to his kidney function. I will go ahead and make follow-up appointment with me because unfortunately we need to proceed with transplant and dialysis planning fairly soon and make sure that he doesn't fall through the cracks 6. CVA- per neurology- work up and intervene as needed 7. Hyperkalemia- suspect is from his vegetarian diet.  Will change him over to renal diet- given kaexylate this AM and also lasix should help     Deloros Beretta A    Labs: Basic Metabolic Panel:  Recent Labs Lab 01/21/16 0905 01/21/16 0915 01/22/16 0530 01/23/16 0500  NA 135 134* 132* 131*  K 4.1 4.1 4.8 5.7*  CL 99* 96* 100* 98*  CO2 24  --  26 26  GLUCOSE 109* 103* 112* 99  BUN 30* 31* 32* 33*  CREATININE 6.03* 5.80* 6.06* 5.94*  CALCIUM 7.9*  --  7.3* 7.6*  PHOS  --   --   --  4.4   Liver Function Tests:  Recent Labs Lab 01/21/16 0905 01/23/16 0500  AST 19  --   ALT 21  --   ALKPHOS 81  --   BILITOT 0.3  --   PROT 5.9*  --   ALBUMIN 2.6* 2.1*   No results for input(s): LIPASE, AMYLASE in the last 168 hours. No results for input(s): AMMONIA in the last 168 hours. CBC:  Recent Labs Lab 01/21/16 0905 01/21/16 0915 01/22/16 0530  WBC 5.0  --  4.2  NEUTROABS 3.0  --   --   HGB 11.0* 12.9* 9.4*  HCT 34.1* 38.0* 28.4*  MCV 79.5  --  80.9  PLT 415*  --  314   Cardiac Enzymes: No results for input(s): CKTOTAL, CKMB, CKMBINDEX, TROPONINI in the last 168  hours. CBG:  Recent Labs Lab 01/21/16 1035  GLUCAP 107*    Iron Studies:  Recent Labs  01/21/16 2010  IRON 33*  TIBC 186*  FERRITIN 200   Studies/Results: Ct Head Wo Contrast  01/21/2016  CLINICAL DATA:  Blurred vision in the left eye since yesterday. Improving today. Posterior headache left side since yesterday EXAM: CT HEAD WITHOUT CONTRAST TECHNIQUE: Contiguous axial images were obtained from the base of the skull through the vertex without intravenous contrast. COMPARISON:  None. FINDINGS: Old appearing posterior left parietal infarct. Mild chronic small vessel disease throughout the deep white matter. Old right basal ganglia lacunar infarcts. No acute intracranial abnormality. Specifically, no hemorrhage, hydrocephalus, mass lesion, acute infarction, or significant intracranial injury. No acute calvarial abnormality. Visualized paranasal sinuses and mastoids clear. Orbital soft tissues unremarkable. IMPRESSION: Old right basal ganglia and left posterior parietal infarcts. Chronic small vessel disease. No acute intracranial abnormality. Electronically Signed   By: Rolm Baptise M.D.   On: 01/21/2016 09:59   Mr Brain Wo Contrast  01/22/2016  CLINICAL DATA:  CVA with blurry vision in the left eye. EXAM: MRI HEAD WITHOUT CONTRAST TECHNIQUE: Multiplanar, multiecho pulse sequences of the brain and surrounding structures were obtained without intravenous contrast. COMPARISON:  Head CT from yesterday FINDINGS: Calvarium and upper cervical spine: No focal marrow signal abnormality. Orbits: Negative. Sinuses and Mastoids: Clear. Brain: Patchy restricted diffusion in the left frontal parietal cortex and subcortical white matter at the vertex, extending in the parietal region to the periventricular white matter where there is the largest confluent area of infarct measuring 15 mm. These are in the MCA and posterior border zone territories. No acute superimposed hemorrhage (susceptibility artifact along  the posterior body of the left lateral ventricle is small nodular calcification based on previous head CT. Major intracranial vessels show no signs of occlusion, but there is asymmetric heterogeneous signal of the left supraclinoid ICA seen on both axial and coronal T2 weighted imaging. There is a remote perforator infarct in the right putamen and genu internal capsule with wallerian degeneration in the right cortical spinal tract. Mild small vessel ischemic change in the bilateral pons. Patchy small vessel ischemic injury elsewhere in the cerebral white matter. Normal cerebral volume. No evidence of mass lesion or hydrocephalus. IMPRESSION: 1. Scattered acute infarction in the left frontal parietal cortex and white matter, MCA distribution. No hemorrhagic conversion. 2. Asymmetric appearance of the left supraclinoid ICA which could reflect stenotic lesion. Suggest MRA 3. Chronic small vessel disease and remote  right basal ganglia infarct. Electronically Signed   By: Monte Fantasia M.D.   On: 01/22/2016 16:22   US Renal  01/22/2016  CLINICAL DATA:  Acute renal failure, hypertension EXAM: RENAL / URINARY TRACT ULTRASOUND COMPLETE COMPARISON:  None FINDINGS: Right Kidney: Length: 10.6 cm. Slightly increased cortical echogenicity. Normal cortical thickness. No mass, hydronephrosis or shadowing calcification. Left Kidney: Length: 11.4 cm. Normal cortical thickness. Increased cortical echogenicity. Tiny exophytic probable cyst 9 x 8 x 7 mm at upper pole. No additional mass, hydronephrosis or shadowing calcification. Bladder: Appears normal for degree of bladder distention. Prostate gland appears enlarged, 4.3 x 4.6 x 3.2 cm. IMPRESSION: Prostatic enlargement. Suspected medical renal disease. Tiny probable LEFT renal cyst 9 mm diameter. Electronically Signed   By: Lavonia Dana M.D.   On: 01/22/2016 08:29   Medications: Infusions:    Scheduled Medications: . aspirin  81 mg Oral Daily  . carvedilol  12.5 mg Oral  BID WC  . ferric gluconate (FERRLECIT/NULECIT) IV  125 mg Intravenous Daily  . furosemide  80 mg Oral BID  . heparin  5,000 Units Subcutaneous 3 times per day  . LORazepam  1 mg Intravenous Once  . sodium chloride flush  3 mL Intravenous Q12H    have reviewed scheduled and prn medications.  Physical Exam: General: NAD Heart: RRR Lungs: mostly clear Abdomen: soft,non tender Extremities: pitting edema    01/23/2016,9:32 AM

## 2016-01-23 NOTE — Progress Notes (Signed)
Nutrition Education Note  RD consulted for low potassium education. Provided Low Potassium handout to patient. Reviewed food groups and provided written recommended serving sizes specifically determined for patient's current nutritional status. Provided list of foods to limit/avoid that are high potassium. Provided specific recommendations on safer alternatives of these foods. Teach back method used.  Expect fair to good compliance.  Body mass index is 29.92 kg/(m^2). Pt meets criteria for overweight based on current BMI.  Current diet order is Renal with 1800 ml fluid restriction, patient is consuming approximately 75% of meals at this time. Labs and medications reviewed. No further nutrition interventions warranted at this time. RD contact information provided. If additional nutrition issues arise, please re-consult RD.   Molli Barrows, RD, LDN, [place] Pager [phone] After Hours Pager [phone]

## 2016-01-23 NOTE — Progress Notes (Signed)
  Echocardiogram 2D Echocardiogram has been performed.  Bobbye Charleston 01/23/2016, 10:56 AM

## 2016-01-24 ENCOUNTER — Observation Stay (HOSPITAL_COMMUNITY): Payer: BLUE CROSS/BLUE SHIELD

## 2016-01-24 DIAGNOSIS — E8770 Fluid overload, unspecified: Secondary | ICD-10-CM | POA: Diagnosis present

## 2016-01-24 DIAGNOSIS — H538 Other visual disturbances: Secondary | ICD-10-CM | POA: Diagnosis present

## 2016-01-24 DIAGNOSIS — Z8673 Personal history of transient ischemic attack (TIA), and cerebral infarction without residual deficits: Secondary | ICD-10-CM | POA: Diagnosis not present

## 2016-01-24 DIAGNOSIS — N179 Acute kidney failure, unspecified: Secondary | ICD-10-CM | POA: Diagnosis present

## 2016-01-24 DIAGNOSIS — E875 Hyperkalemia: Secondary | ICD-10-CM | POA: Diagnosis present

## 2016-01-24 DIAGNOSIS — E1122 Type 2 diabetes mellitus with diabetic chronic kidney disease: Secondary | ICD-10-CM | POA: Diagnosis present

## 2016-01-24 DIAGNOSIS — I161 Hypertensive emergency: Secondary | ICD-10-CM | POA: Diagnosis present

## 2016-01-24 DIAGNOSIS — H539 Unspecified visual disturbance: Secondary | ICD-10-CM | POA: Diagnosis present

## 2016-01-24 DIAGNOSIS — N281 Cyst of kidney, acquired: Secondary | ICD-10-CM | POA: Diagnosis present

## 2016-01-24 DIAGNOSIS — E871 Hypo-osmolality and hyponatremia: Secondary | ICD-10-CM | POA: Diagnosis present

## 2016-01-24 DIAGNOSIS — E785 Hyperlipidemia, unspecified: Secondary | ICD-10-CM | POA: Diagnosis present

## 2016-01-24 DIAGNOSIS — I120 Hypertensive chronic kidney disease with stage 5 chronic kidney disease or end stage renal disease: Secondary | ICD-10-CM | POA: Diagnosis present

## 2016-01-24 DIAGNOSIS — I63412 Cerebral infarction due to embolism of left middle cerebral artery: Secondary | ICD-10-CM | POA: Diagnosis present

## 2016-01-24 DIAGNOSIS — N185 Chronic kidney disease, stage 5: Secondary | ICD-10-CM | POA: Diagnosis present

## 2016-01-24 DIAGNOSIS — D631 Anemia in chronic kidney disease: Secondary | ICD-10-CM | POA: Diagnosis present

## 2016-01-24 DIAGNOSIS — N400 Enlarged prostate without lower urinary tract symptoms: Secondary | ICD-10-CM | POA: Diagnosis present

## 2016-01-24 DIAGNOSIS — I639 Cerebral infarction, unspecified: Secondary | ICD-10-CM | POA: Diagnosis present

## 2016-01-24 DIAGNOSIS — D509 Iron deficiency anemia, unspecified: Secondary | ICD-10-CM | POA: Diagnosis present

## 2016-01-24 LAB — RENAL FUNCTION PANEL
ALBUMIN: 2 g/dL — AB (ref 3.5–5.0)
Anion gap: 10 (ref 5–15)
BUN: 36 mg/dL — AB (ref 6–20)
CO2: 22 mmol/L (ref 22–32)
CREATININE: 5.72 mg/dL — AB (ref 0.61–1.24)
Calcium: 7.5 mg/dL — ABNORMAL LOW (ref 8.9–10.3)
Chloride: 95 mmol/L — ABNORMAL LOW (ref 101–111)
GFR calc Af Amer: 11 mL/min — ABNORMAL LOW (ref >60)
GFR, EST NON AFRICAN AMERICAN: 10 mL/min — AB (ref >60)
GLUCOSE: 98 mg/dL (ref 65–99)
PHOSPHORUS: 4.8 mg/dL — AB (ref 2.5–4.6)
Potassium: 4 mmol/L (ref 3.5–5.1)
SODIUM: 127 mmol/L — AB (ref 135–145)

## 2016-01-24 LAB — PARATHYROID HORMONE, INTACT (NO CA): PTH: 336 pg/mL — AB (ref 15–65)

## 2016-01-24 MED ORDER — ASPIRIN 81 MG PO CHEW: 81.0000 mg | CHEWABLE_TABLET | Freq: Every day | ORAL | Status: DC

## 2016-01-24 MED ORDER — AMLODIPINE BESYLATE 10 MG PO TABS: 10.0000 mg | ORAL_TABLET | Freq: Every day | ORAL | Status: DC

## 2016-01-24 MED ORDER — CLOPIDOGREL BISULFATE 75 MG PO TABS: 75.0000 mg | ORAL_TABLET | Freq: Every day | ORAL | Status: DC

## 2016-01-24 MED ORDER — CALCITRIOL 0.25 MCG PO CAPS
0.2500 ug | ORAL_CAPSULE | Freq: Every day | ORAL | Status: DC
  Administered 2016-01-24: 0.25 ug via ORAL
  Filled 2016-01-24: qty 1

## 2016-01-24 MED ORDER — FUROSEMIDE 80 MG PO TABS: 80.0000 mg | ORAL_TABLET | Freq: Two times a day (BID) | ORAL | Status: DC

## 2016-01-24 MED ORDER — CARVEDILOL 12.5 MG PO TABS: 12.5000 mg | ORAL_TABLET | Freq: Two times a day (BID) | ORAL | Status: DC

## 2016-01-24 MED ORDER — ATORVASTATIN CALCIUM 40 MG PO TABS: 40.0000 mg | ORAL_TABLET | Freq: Every day | ORAL | Status: DC

## 2016-01-24 MED ORDER — HYDRALAZINE HCL 100 MG PO TABS: 100.0000 mg | ORAL_TABLET | Freq: Three times a day (TID) | ORAL | Status: DC

## 2016-01-24 MED FILL — ATORVASTATIN 40 MG TABLET: 40 | 30 days supply | Qty: 30 | Fill #0

## 2016-01-24 MED FILL — CARVEDILOL 12.5 MG TABLET: 12.5 | 30 days supply | Qty: 60 | Fill #0

## 2016-01-24 MED FILL — AMLODIPINE BESYLATE 10 MG T: 10 | 30 days supply | Qty: 30 | Fill #0

## 2016-01-24 MED FILL — FUROSEMIDE 80 MG TABLET: 80 | 30 days supply | Qty: 60 | Fill #0

## 2016-01-24 MED FILL — hydrALAZINE HCL 100 MG TABS: 100 | 30 days supply | Qty: 90 | Fill #0

## 2016-01-24 MED FILL — CLOPIDOGREL 75 MG TABLET: 75 | 30 days supply | Qty: 30 | Fill #0

## 2016-01-24 NOTE — Discharge Summary (Signed)
Allen Miller, is a 60 y.o. male  DOB 03/05/1956  MRN [id number].  Admission date:  01/21/2016  Admitting Physician  Waldemar Dickens, MD  Discharge Date:  01/24/2016   Primary MD  No primary care provider on file.  Recommendations for primary care physician for things to follow:   Monitor blood pressure and secondary risk factors for CVA.  Monitor BMP closely, needs outpatient neurology and renal follow-up.   Admission Diagnosis  Blurred vision, left eye [H53.8] Hypertensive emergency [I16.1]   Discharge Diagnosis  Blurred vision, left eye [H53.8] Hypertensive emergency [I16.1]     Active Problems:   Hypertensive emergency   History of CVA (cerebrovascular accident)   Visual disturbance   Microcytic anemia   Acute renal failure superimposed on stage 4 chronic kidney disease ([place])   Acute CVA (cerebrovascular accident) [facility])      Past Medical History  Diagnosis Date  . Hypertension     History reviewed. No pertinent past surgical history.     HPI  from the history and physical done on the day of admission:   Allen Miller is a 60 y.o. male from [place] in town visiting his son. Patient has a history of hypertension and chronic kidney disease. He developed blurry vision and left eye yesterday. Due to persistent visual disturbances patient came to the emergency room department where his blood pressure was found to be significantly elevated. No chest pains or shortness of breath. No nausea or vomiting. Patient believes his creatinine is normally around 4, it is 6 today. Patient took his home blood pressure medications this morning. He monitors blood pressure at home and states his systolic is usually in the 150s as it was this morning. Patient does not smoke.        Hospital Course:    1.  Hypertensive emergency. Initial impression upon admission, in hindsight patient spent pressure was high due to stroke, have placed him on 4, hydralazine and Norvasc, goal is to gradually bring the blood pressure to normal levels over the next 1-2 weeks.  2. ARF on CK D stage V. Baseline creatinine appears to be close to 4.5. Currently 6.5. Renal ultrasound nonacute, UA consistent with large proteinuria and some hematuria, nephrology consulted. Hold ARB for now. Does have edema. Renal saw the patient, agreed with holding ARB, place him on Lasix for fluid overload and hyponatremia, needs close outpatient BMP monitoring along with follow-up with renal.  3. Left eye blurry vision, H/O CVA. Started 12-14 hours prior to hospital visit, in the ER head CT was nonacute, MRI brain confirms possible embolic stroke, neurology on board, he will be placed on aspirin and Plavix for 3 months thereafter aspirin only per neurology, LDL was above goal and he has been placed on statin, vision is much improved, A1c was 5.6. He must follow with neurology outpatient within 1-2 months.   4. BPH along with small left renal cyst. Outpatient urology follow-up per PCP.  5.AOCD - stable.        Discharge  Condition: Stable  Follow UP  Follow-up Information    Follow up with Roselie Awkward, MD.   Specialty:  Ophthalmology   Contact information:   [address] [place] [address] [phone]       Please follow up.   Why:  at 1:15 PM      Follow up with Louis Meckel, MD On 02/12/2016.   Specialty:  Nephrology   Why:  appt is at 8 AM- please be there at 7:45   Contact information:   [place] [address] [phone]       Follow up with [facility]. Schedule an appointment as soon as possible for a visit in 3 weeks.   Why:  CVA   Contact information:   [address]     [place] [phone]       Consults  obtained - Neuro, renal  Diet and Activity recommendation: See Discharge Instructions below  Discharge Instructions           Discharge Instructions    Discharge instructions    Complete by:  As directed   Follow with Primary MD in 7 days   Get CBC, CMP, 2 view Chest X ray checked  by Primary MD next visit.    Activity: As tolerated with Full fall precautions use walker/cane & assistance as needed   Disposition Home     Diet:  Renal - Check your Weight same time everyday, if you gain over 2 pounds, or you develop in leg swelling, experience more shortness of breath or chest pain, call your Primary MD immediately. Follow Cardiac Low Salt Diet and 1.5 lit/day fluid restriction.   On your next visit with your primary care physician please Get Medicines reviewed and adjusted.   Please request your Prim.MD to go over all Hospital Tests and Procedure/Radiological results at the follow up, please get all Hospital records sent to your Prim MD by signing hospital release before you go home.   If you experience worsening of your admission symptoms, develop shortness of breath, life threatening emergency, suicidal or homicidal thoughts you must seek medical attention immediately by calling 911 or calling your MD immediately  if symptoms less severe.  You Must read complete instructions/literature along with all the possible adverse reactions/side effects for all the Medicines you take and that have been prescribed to you. Take any new Medicines after you have completely understood and accpet all the possible adverse reactions/side effects.   Do not drive, operating heavy machinery, perform activities at heights, swimming or participation in water activities or provide baby sitting services if your were admitted for syncope or siezures until you have seen by Primary MD or a Neurologist and advised to do so again.  Do not drive when taking Pain medications.    Do not take more than  prescribed Pain, Sleep and Anxiety Medications  Special Instructions: If you have smoked or chewed Tobacco  in the last 2 yrs please stop smoking, stop any regular Alcohol  and or any Recreational drug use.  Wear Seat belts while driving.   Please note  You were cared for by a hospitalist during your hospital stay. If you have any questions about your discharge medications or the care you received while you were in the hospital after you are discharged, you can call the unit and asked to speak with the hospitalist on call if the hospitalist that took care of you is not available.  Once you are discharged, your primary care physician will handle any further medical issues. Please note that NO REFILLS for any discharge medications will be authorized once you are discharged, as it is imperative that you return to your primary care physician (or establish a relationship with a primary care physician if you do not have one) for your aftercare needs so that they can reassess your need for medications and monitor your lab values.     Increase activity slowly    Complete by:  As directed              Discharge Medications       Medication List    STOP taking these medications        losartan-hydrochlorothiazide 100-25 MG tablet  Commonly known as:  HYZAAR     metoprolol tartrate 25 MG tablet  Commonly known as:  LOPRESSOR      TAKE these medications        amLODipine 10 MG tablet  Commonly known as:  NORVASC  Take 1 tablet (10 mg total) by mouth daily.  Start taking on:  01/26/2016     aspirin 81 MG chewable tablet  Chew 1 tablet (81 mg total) by mouth daily.     atorvastatin 40 MG tablet  Commonly known as:  LIPITOR  Take 1 tablet (40 mg total) by mouth daily at 6 PM.     carvedilol 12.5 MG tablet  Commonly known as:  COREG  Take 1 tablet (12.5 mg total) by mouth 2 (two) times daily with a meal.     clopidogrel 75 MG tablet  Commonly known as:  PLAVIX  Take 1 tablet (75 mg  total) by mouth daily.     furosemide 80 MG tablet  Commonly known as:  LASIX  Take 1 tablet (80 mg total) by mouth 2 (two) times daily.     hydrALAZINE 100 MG tablet  Commonly known as:  APRESOLINE  Take 1 tablet (100 mg total) by mouth 3 (three) times daily.        Major procedures and Radiology Reports - PLEASE review detailed and final reports for all details, in brief -   TTE Left ventricle: The cavity size was normal. Wall thickness was increased in a pattern of moderate to severe LVH. Systolic function was normal. The estimated ejection fraction was in the range of 50% to 55%. Wall motion was normal; there were no regional wall motion abnormalities. Doppler parameters are consistent with a reversible restrictive pattern, indicative of decreased left ventricular diastolic compliance and/or increased left atrial pressure (grade 3 diastolic dysfunction). - Aortic valve: There was mild regurgitation. - Mitral valve: There was mild regurgitation. - Left atrium: The atrium was moderately dilated. - Pericardium, extracardiac: A small to moderate pericardial effusion was identified circumferential to the heart.   Ct Head Wo Contrast  01/21/2016  CLINICAL DATA:  Blurred vision in the left eye since yesterday. Improving today. Posterior headache left side since yesterday EXAM: CT HEAD WITHOUT CONTRAST TECHNIQUE: Contiguous axial images were obtained from the base of the skull through the vertex without intravenous contrast. COMPARISON:  None. FINDINGS: Old appearing posterior left parietal infarct. Mild chronic small vessel disease throughout the deep white matter. Old right basal ganglia lacunar infarcts. No acute intracranial abnormality. Specifically, no hemorrhage, hydrocephalus, mass lesion, acute infarction, or significant intracranial injury. No acute calvarial abnormality. Visualized paranasal sinuses and mastoids clear. Orbital soft tissues unremarkable.  IMPRESSION: Old right basal ganglia and left posterior  parietal infarcts. Chronic small vessel disease. No acute intracranial abnormality. Electronically Signed   By: Rolm Baptise M.D.   On: 01/21/2016 09:59   Mr Virgel [place]aling Wo Contrast  01/23/2016  CLINICAL DATA:  60 year old male with left eye visual changes found to have acute left MCA infarcts, and possible left supraclinoid ICA stenosis on brain MRI yesterday. Initial encounter. EXAM: MRA NECK WITHOUT CONTRAST MRA HEAD WITHOUT CONTRAST TECHNIQUE: Angiographic images of the neck were obtained using MRA technique without intravenous contast.; Angiographic images of the Circle of Willis were obtained using MRA technique without intravenous contrast. COMPARISON:  Brain MRI 01/22/2016, head CT without contrast 01/21/2016. FINDINGS: MRA NECK FINDINGS Time-of-flight neck MRA imaging reveals antegrade flow in both carotid and vertebral arteries in the neck. The carotid bifurcations appear normal. The vertebral arteries are codominant. The great vessel origins are not well evaluated in the absence of contrast, but no proximal right vertebral artery or bilateral common carotid stenosis is identified. There is mild to moderate stenosis suggested in the left vertebral artery at the junction of the V1 and V2 segments (series 805, image 11). Otherwise no vertebral artery stenosis in the neck. No cervical ICA stenosis. MRA HEAD FINDINGS Antegrade flow in the posterior circulation with codominant distal vertebral arteries. Patent vertebrobasilar junction and bilateral AICA origins. No basilar stenosis. SCA and PCA origins are within normal limits. Both posterior communicating arteries are present. Right PCA branches are within normal limits. There is mild to moderate irregularity and stenosis in the superior division of the left PCA P3 segment (series 307, image 5). Antegrade flow in both ICA siphons. Siphon patency appears normal to the distal cavernous segments. Both  supraclinoid ICAs appear irregular and stenotic, with moderate to severe bilateral supraclinoid stenosis which appears somewhat greater on the left. Both ophthalmic artery origins remain patent. Both posterior communicating artery origins remain patent. Both carotid termini remain patent despite these findings, probably with some collateral flow via the posterior communicating arteries. MCA and ACA origins are within normal limits. Tortuous A1 segments. Anterior communicating artery and visualized ACA branches are within normal limits. Right MCA origin is normal. Early right MCA bifurcation. Visualized right MCA branches are with normal limits. Left MCA origin, M1 segment, for K shin, and visualized left MCA branches are within normal limits. IMPRESSION: 1. No carotid stenosis in the neck. Moderate severe bilateral supraclinoid ICA stenosis, perhaps greater on the left. 2. Despite that, both carotid termini remain patent and appear normal - probably in part due to posterior communicating artery collateral flow. 3. No left MCA stenosis or occlusion identified. 4. Up to moderate proximal left vertebral artery stenosis in the neck. Mild to moderate irregularity and stenosis in the distal left PCA (P3 segment). 5. Otherwise negative posterior circulation. Electronically Signed   By: Genevie Ann M.D.   On: 01/23/2016 18:42   Mr Angiogram Neck Wo Contrast  01/23/2016  CLINICAL DATA:  60 year old male with left eye visual changes found to have acute left MCA infarcts, and possible left supraclinoid ICA stenosis on brain MRI yesterday. Initial encounter. EXAM: MRA NECK WITHOUT CONTRAST MRA HEAD WITHOUT CONTRAST TECHNIQUE: Angiographic images of the neck were obtained using MRA technique without intravenous contast.; Angiographic images of the Circle of Willis were obtained using MRA technique without intravenous contrast. COMPARISON:  Brain MRI 01/22/2016, head CT without contrast 01/21/2016. FINDINGS: MRA NECK FINDINGS  Time-of-flight neck MRA imaging reveals antegrade flow in both carotid and vertebral arteries in the neck. The carotid bifurcations appear  normal. The vertebral arteries are codominant. The great vessel origins are not well evaluated in the absence of contrast, but no proximal right vertebral artery or bilateral common carotid stenosis is identified. There is mild to moderate stenosis suggested in the left vertebral artery at the junction of the V1 and V2 segments (series 805, image 11). Otherwise no vertebral artery stenosis in the neck. No cervical ICA stenosis. MRA HEAD FINDINGS Antegrade flow in the posterior circulation with codominant distal vertebral arteries. Patent vertebrobasilar junction and bilateral AICA origins. No basilar stenosis. SCA and PCA origins are within normal limits. Both posterior communicating arteries are present. Right PCA branches are within normal limits. There is mild to moderate irregularity and stenosis in the superior division of the left PCA P3 segment (series 307, image 5). Antegrade flow in both ICA siphons. Siphon patency appears normal to the distal cavernous segments. Both supraclinoid ICAs appear irregular and stenotic, with moderate to severe bilateral supraclinoid stenosis which appears somewhat greater on the left. Both ophthalmic artery origins remain patent. Both posterior communicating artery origins remain patent. Both carotid termini remain patent despite these findings, probably with some collateral flow via the posterior communicating arteries. MCA and ACA origins are within normal limits. Tortuous A1 segments. Anterior communicating artery and visualized ACA branches are within normal limits. Right MCA origin is normal. Early right MCA bifurcation. Visualized right MCA branches are with normal limits. Left MCA origin, M1 segment, for K shin, and visualized left MCA branches are within normal limits. IMPRESSION: 1. No carotid stenosis in the neck. Moderate severe  bilateral supraclinoid ICA stenosis, perhaps greater on the left. 2. Despite that, both carotid termini remain patent and appear normal - probably in part due to posterior communicating artery collateral flow. 3. No left MCA stenosis or occlusion identified. 4. Up to moderate proximal left vertebral artery stenosis in the neck. Mild to moderate irregularity and stenosis in the distal left PCA (P3 segment). 5. Otherwise negative posterior circulation. Electronically Signed   By: Genevie Ann M.D.   On: 01/23/2016 18:42   Mr Brain Wo Contrast  01/22/2016  CLINICAL DATA:  CVA with blurry vision in the left eye. EXAM: MRI HEAD WITHOUT CONTRAST TECHNIQUE: Multiplanar, multiecho pulse sequences of the brain and surrounding structures were obtained without intravenous contrast. COMPARISON:  Head CT from yesterday FINDINGS: Calvarium and upper cervical spine: No focal marrow signal abnormality. Orbits: Negative. Sinuses and Mastoids: Clear. Brain: Patchy restricted diffusion in the left frontal parietal cortex and subcortical white matter at the vertex, extending in the parietal region to the periventricular white matter where there is the largest confluent area of infarct measuring 15 mm. These are in the MCA and posterior border zone territories. No acute superimposed hemorrhage (susceptibility artifact along the posterior body of the left lateral ventricle is small nodular calcification based on previous head CT. Major intracranial vessels show no signs of occlusion, but there is asymmetric heterogeneous signal of the left supraclinoid ICA seen on both axial and coronal T2 weighted imaging. There is a remote perforator infarct in the right putamen and genu internal capsule with wallerian degeneration in the right cortical spinal tract. Mild small vessel ischemic change in the bilateral pons. Patchy small vessel ischemic injury elsewhere in the cerebral white matter. Normal cerebral volume. No evidence of mass lesion or  hydrocephalus. IMPRESSION: 1. Scattered acute infarction in the left frontal parietal cortex and white matter, MCA distribution. No hemorrhagic conversion. 2. Asymmetric appearance of the left supraclinoid ICA which  could reflect stenotic lesion. Suggest MRA 3. Chronic small vessel disease and remote right basal ganglia infarct. Electronically Signed   By: Monte Fantasia M.D.   On: 01/22/2016 16:22   US Renal  01/22/2016  CLINICAL DATA:  Acute renal failure, hypertension EXAM: RENAL / URINARY TRACT ULTRASOUND COMPLETE COMPARISON:  None FINDINGS: Right Kidney: Length: 10.6 cm. Slightly increased cortical echogenicity. Normal cortical thickness. No mass, hydronephrosis or shadowing calcification. Left Kidney: Length: 11.4 cm. Normal cortical thickness. Increased cortical echogenicity. Tiny exophytic probable cyst 9 x 8 x 7 mm at upper pole. No additional mass, hydronephrosis or shadowing calcification. Bladder: Appears normal for degree of bladder distention. Prostate gland appears enlarged, 4.3 x 4.6 x 3.2 cm. IMPRESSION: Prostatic enlargement. Suspected medical renal disease. Tiny probable LEFT renal cyst 9 mm diameter. Electronically Signed   By: Lavonia Dana M.D.   On: 01/22/2016 08:29    Micro Results      Recent Results (from the past 240 hour(s))  Urine culture     Status: None   Collection Time: 01/22/16  8:08 AM  Result Value Ref Range Status   Specimen Description URINE, RANDOM  Final   Special Requests NONE  Final   Culture MULTIPLE SPECIES PRESENT, SUGGEST RECOLLECTION  Final   Report Status 01/23/2016 FINAL  Final       Today   Subjective    Allen Miller today has no headache,no chest abdominal pain,no new weakness tingling or numbness, feels much better wants to go home today.     Objective   Blood pressure 180/80, pulse 73, temperature 98.7 F (37.1 C), temperature source Oral, resp. rate 18, height 5\' 4"  (1.626 m), weight 77.701 kg (171 lb 4.8 oz), SpO2 100  %.   Intake/Output Summary (Last 24 hours) at 01/24/16 1330 Last data filed at 01/24/16 [id number]  Gross per 24 hour  Intake    880 ml  Output   2250 ml  Net  -1370 ml    Exam Awake Alert, Oriented x 3, No new F.N deficits, Normal affect [place].AT,PERRAL Supple Neck,No JVD, No cervical lymphadenopathy appriciated.  Symmetrical Chest wall movement, Good air movement bilaterally, CTAB RRR,No Gallops,Rubs or new Murmurs, No Parasternal Heave +ve B.Sounds, Abd Soft, Non tender, No organomegaly appriciated, No rebound -guarding or rigidity. No Cyanosis, Clubbing , 2+ edema, No new Rash or bruise   Data Review   CBC w Diff:  Lab Results  Component Value Date   WBC 4.2 01/22/2016   HGB 9.4* 01/22/2016   HCT 28.4* 01/22/2016   PLT 314 01/22/2016   LYMPHOPCT 24 01/21/2016   MONOPCT 9 01/21/2016   EOSPCT 6 01/21/2016   BASOPCT 1 01/21/2016    CMP:  Lab Results  Component Value Date   NA 127* 01/24/2016   K 4.0 01/24/2016   CL 95* 01/24/2016   CO2 22 01/24/2016   BUN 36* 01/24/2016   CREATININE 5.72* 01/24/2016   PROT 5.9* 01/21/2016   ALBUMIN 2.0* 01/24/2016   BILITOT 0.3 01/21/2016   ALKPHOS 81 01/21/2016   AST 19 01/21/2016   ALT 21 01/21/2016  .   Total Time in preparing paper work, data evaluation and todays exam - 35 minutes  Thurnell Lose M.D on 01/24/2016 at 1:30 PM  [facility]   Office  [phone]

## 2016-01-24 NOTE — Discharge Instructions (Signed)
Follow with Primary MD in 7 days   Get CBC, CMP, 2 view Chest X ray checked  by Primary MD next visit.    Activity: As tolerated with Full fall precautions use walker/cane & assistance as needed   Disposition Home     Diet:  Renal - Check your Weight same time everyday, if you gain over 2 pounds, or you develop in leg swelling, experience more shortness of breath or chest pain, call your Primary MD immediately. Follow Cardiac Low Salt Diet and 1.5 lit/day fluid restriction.   On your next visit with your primary care physician please Get Medicines reviewed and adjusted.   Please request your Prim.MD to go over all Hospital Tests and Procedure/Radiological results at the follow up, please get all Hospital records sent to your Prim MD by signing hospital release before you go home.   If you experience worsening of your admission symptoms, develop shortness of breath, life threatening emergency, suicidal or homicidal thoughts you must seek medical attention immediately by calling 911 or calling your MD immediately  if symptoms less severe.  You Must read complete instructions/literature along with all the possible adverse reactions/side effects for all the Medicines you take and that have been prescribed to you. Take any new Medicines after you have completely understood and accpet all the possible adverse reactions/side effects.   Do not drive, operating heavy machinery, perform activities at heights, swimming or participation in water activities or provide baby sitting services if your were admitted for syncope or siezures until you have seen by Primary MD or a Neurologist and advised to do so again.  Do not drive when taking Pain medications.    Do not take more than prescribed Pain, Sleep and Anxiety Medications  Special Instructions: If you have smoked or chewed Tobacco  in the last 2 yrs please stop smoking, stop any regular Alcohol  and or any Recreational drug use.  Wear Seat  belts while driving.   Please note  You were cared for by a hospitalist during your hospital stay. If you have any questions about your discharge medications or the care you received while you were in the hospital after you are discharged, you can call the unit and asked to speak with the hospitalist on call if the hospitalist that took care of you is not available. Once you are discharged, your primary care physician will handle any further medical issues. Please note that NO REFILLS for any discharge medications will be authorized once you are discharged, as it is imperative that you return to your primary care physician (or establish a relationship with a primary care physician if you do not have one) for your aftercare needs so that they can reassess your need for medications and monitor your lab values.

## 2016-01-24 NOTE — Plan of Care (Signed)
Problem: Safety: Goal: Ability to remain free from injury will improve Outcome: Completed/Met Date Met:  01/24/16 Patient is at low risk for falls per RN fall risk assessment.  Safe environment being provided per staff.

## 2016-01-24 NOTE — Evaluation (Signed)
Physical Therapy Evaluation and Discharge Patient Details Name: Allen Miller MRN: [id number] DOB: 21-Jul-1956 Today's Date: 01/24/2016   History of Present Illness  Pt is a 60 y/o M admitted for hypertension urgency following c/o blurred vision in Lt eye.  MRI revealed scattered acute infarcts in Lt frontal parietal cortex and white matter, MCA distribution.        Clinical Impression  Pt admitted with above diagnosis. No instability w/ high level balance activities and pt Ind w/ all mobility.  No skilled PT needs identified.  PT is signing off.    Follow Up Recommendations No PT follow up    Equipment Recommendations  None recommended by PT    Recommendations for Other Services       Precautions / Restrictions Precautions Precautions: None Restrictions Weight Bearing Restrictions: No      Mobility  Bed Mobility               General bed mobility comments: Pt sitting EOB upon PT arrival  Transfers Overall transfer level: Independent Equipment used: None             General transfer comment: No cues or physical assist needed  Ambulation/Gait Ambulation/Gait assistance: Independent Ambulation Distance (Feet): 300 Feet Assistive device: None Gait Pattern/deviations: WFL(Within Functional Limits)   Gait velocity interpretation: at or above normal speed for age/gender General Gait Details: No instability noted w/ high level balance activities.  Vision did not appear to impact safe ambulation.  Stairs            Wheelchair Mobility    Modified Rankin (Stroke Patients Only) Modified Rankin (Stroke Patients Only) Pre-Morbid Rankin Score: No symptoms Modified Rankin: No significant disability     Balance Overall balance assessment: Independent Sitting-balance support: Feet supported;No upper extremity supported Sitting balance-Leahy Scale: Normal     Standing balance support: No upper extremity supported;During functional activity Standing  balance-Leahy Scale: Normal               High level balance activites: Backward walking;Direction changes;Head turns;Turns;Other (comment) (stepping over objects, walking around objects) High Level Balance Comments: no instability noted w/ high level balance activities             Pertinent Vitals/Pain Pain Assessment: No/denies pain    Home Living Family/patient expects to be discharged to:: Private residence Living Arrangements: Children Available Help at Discharge: Family;Available 24 hours/day Type of Home: House Home Access: Stairs to enter Entrance Stairs-Rails: Can reach both;Left;Right Entrance Stairs-Number of Steps: 2 Home Layout: One level Home Equipment: None Additional Comments: Pt is currently living in [place] but is planning to stay w/ his son who lives here while he looks for a place to move in in the area    Prior Function Level of Independence: Independent               Hand Dominance        Extremity/Trunk Assessment   Upper Extremity Assessment: Overall WFL for tasks assessed           Lower Extremity Assessment: Overall WFL for tasks assessed      Cervical / Trunk Assessment: Normal  Communication   Communication: No difficulties  Cognition Arousal/Alertness: Awake/alert Behavior During Therapy: WFL for tasks assessed/performed Overall Cognitive Status: Within Functional Limits for tasks assessed                      General Comments General comments (skin integrity, edema, etc.): Discussed w/ pt the  signs and symptoms of a stroke and what to do if he or a family member recognizes them.    Exercises        Assessment/Plan    PT Assessment Patent does not need any further PT services  PT Diagnosis Difficulty walking   PT Problem List    PT Treatment Interventions     PT Goals (Current goals can be found in the Care Plan section) Acute Rehab PT Goals Patient Stated Goal: to go home    Frequency      Barriers to discharge        Co-evaluation               End of Session Equipment Utilized During Treatment: Gait belt Activity Tolerance: Patient tolerated treatment well Patient left: in chair;with call bell/phone within reach;with family/visitor present Nurse Communication: Mobility status         Time: [id number] PT Time Calculation (min) (ACUTE ONLY): 16 min   Charges:   PT Evaluation $PT Eval Low Complexity: 1 Procedure     PT G Codes:       Joslyn Hy PT, DPT [phone] Pager: [phone] 01/24/2016, 2:40 PM

## 2016-01-24 NOTE — Hospital Discharge Follow-Up (Signed)
[facility] [place]:  Spoke with Jacqlyn Krauss, RN CM who indicated patient needing hospital follow-up appointment. Appointment scheduled for 01/29/16 at 1430 with Dr. Leanne Chang. AVS updated. Jacqlyn Krauss, RN CM updated.

## 2016-01-24 NOTE — Progress Notes (Signed)
Subjective:  Feels the same- still with visual disturbance-  BP has been 140-180 last 24 hours- clonidine now stopped in effort to keep blood pressure a little higher for now in the setting of watershed infarcts- kidney function stable to improved- made 2200 of urine - K is down- educated regarding foods high in K- sodium down ?   Objective Vital signs in last 24 hours: Filed Vitals:   01/23/16 2053 01/24/16 0002 01/24/16 0359 01/24/16 0830  BP: 176/71 171/67 149/64 180/95  Pulse: 76 77 75 82  Temp: 99.1 F (37.3 C) 99.2 F (37.3 C) 98.7 F (37.1 C) 98.9 F (37.2 C)  TempSrc: Oral Oral Oral Oral  Resp: 18 18 18 18   Height:      Weight:   77.701 kg (171 lb 4.8 oz)   SpO2: 100% 100% 100% 100%   Weight change: -1.406 kg (-3 lb 1.6 oz)  Intake/Output Summary (Last 24 hours) at 01/24/16 0958 Last data filed at 01/24/16 [id number]  Gross per 24 hour  Intake   1120 ml  Output   2250 ml  Net  -1130 ml    Assessment/Plan: 60 year old black male with advanced chronic kidney disease with proteinuria in the setting of long-standing poorly controlled hypertension which is likely the cause. He was noted to have a GFR in the teens in the past. 1.Renal- advanced chronic kidney disease evaluated by a nephrologist. From what he tells me I don't believe his kidney function is not much different than what it has been. Unfortunately, his chronic kidney disease is quite advanced and he is just barely over the threshold of being dialysis requiring. He does seem to understand this. He is not having any uremic symptoms at this time that would necessitate the initiation of RRT.  2. Hypertension/volume - presenting with what appears to be a hypertensive urgency. I get the impression that he's had long-standing poorly controlled blood pressure and possibly some compensatory htn in the setting of CVAs. I agree with not reinitiating his ARB. He is volume overloaded and that is likely driving some of this.  I have  changed his beta blocker to coreg and added lasix- clonidine was stopped to keep BP a little on the high side for now- has PRNS if it gets too high 3. Anemia - is likely related to his CKD. His iron stores are low as well. Will give IV iron and start an ESA  4. Bones-  PTH 336  Will add vit D-  and phosphorus 4.8- no binder 5. Dispo- patient tells me that he's going to be moving to this area. As far as the goals of this hospitalization will be to get blood pressure under reasonable control without dropping it too much. Hopefully we can do this without significant changes to his kidney function. I will go ahead and make follow-up appointment with me because unfortunately we need to proceed with transplant and dialysis planning fairly soon and make sure that he doesn't fall through the cracks- if OK for discharge otherwise I am OK with it- will make follow up appt with me 3/15 at 8 AM 6. CVA- per neurology- work up neg echo and no carotid dz - started on antiplatelet therapy with follow up 7. Hyperkalemia- suspect is from his vegetarian diet.  Back to normal- educated regarding foods high in K 8. Hyponatremia- not sure why is worsening- suspect is due to volume overload so should improve with diuresis  Derral Colucci A    Labs: Basic Metabolic  Panel:  Recent Labs Lab 01/22/16 0530 01/23/16 0500 01/24/16 0445  NA 132* 131* 127*  K 4.8 5.7* 4.0  CL 100* 98* 95*  CO2 26 26 22   GLUCOSE 112* 99 98  BUN 32* 33* 36*  CREATININE 6.06* 5.94* 5.72*  CALCIUM 7.3* 7.6* 7.5*  PHOS  --  4.4 4.8*   Liver Function Tests:  Recent Labs Lab 01/21/16 0905 01/23/16 0500 01/24/16 0445  AST 19  --   --   ALT 21  --   --   ALKPHOS 81  --   --   BILITOT 0.3  --   --   PROT 5.9*  --   --   ALBUMIN 2.6* 2.1* 2.0*   No results for input(s): LIPASE, AMYLASE in the last 168 hours. No results for input(s): AMMONIA in the last 168 hours. CBC:  Recent Labs Lab 01/21/16 0905 01/21/16 0915  01/22/16 0530  WBC 5.0  --  4.2  NEUTROABS 3.0  --   --   HGB 11.0* 12.9* 9.4*  HCT 34.1* 38.0* 28.4*  MCV 79.5  --  80.9  PLT 415*  --  314   Cardiac Enzymes: No results for input(s): CKTOTAL, CKMB, CKMBINDEX, TROPONINI in the last 168 hours. CBG:  Recent Labs Lab 01/21/16 1035  GLUCAP 107*    Iron Studies:   Recent Labs  01/21/16 2010  IRON 33*  TIBC 186*  FERRITIN 200   Studies/Results: Mr Virgel Paling Wo Contrast  01/23/2016  CLINICAL DATA:  60 year old male with left eye visual changes found to have acute left MCA infarcts, and possible left supraclinoid ICA stenosis on brain MRI yesterday. Initial encounter. EXAM: MRA NECK WITHOUT CONTRAST MRA HEAD WITHOUT CONTRAST TECHNIQUE: Angiographic images of the neck were obtained using MRA technique without intravenous contast.; Angiographic images of the Circle of Willis were obtained using MRA technique without intravenous contrast. COMPARISON:  Brain MRI 01/22/2016, head CT without contrast 01/21/2016. FINDINGS: MRA NECK FINDINGS Time-of-flight neck MRA imaging reveals antegrade flow in both carotid and vertebral arteries in the neck. The carotid bifurcations appear normal. The vertebral arteries are codominant. The great vessel origins are not well evaluated in the absence of contrast, but no proximal right vertebral artery or bilateral common carotid stenosis is identified. There is mild to moderate stenosis suggested in the left vertebral artery at the junction of the V1 and V2 segments (series 805, image 11). Otherwise no vertebral artery stenosis in the neck. No cervical ICA stenosis. MRA HEAD FINDINGS Antegrade flow in the posterior circulation with codominant distal vertebral arteries. Patent vertebrobasilar junction and bilateral AICA origins. No basilar stenosis. SCA and PCA origins are within normal limits. Both posterior communicating arteries are present. Right PCA branches are within normal limits. There is mild to moderate  irregularity and stenosis in the superior division of the left PCA P3 segment (series 307, image 5). Antegrade flow in both ICA siphons. Siphon patency appears normal to the distal cavernous segments. Both supraclinoid ICAs appear irregular and stenotic, with moderate to severe bilateral supraclinoid stenosis which appears somewhat greater on the left. Both ophthalmic artery origins remain patent. Both posterior communicating artery origins remain patent. Both carotid termini remain patent despite these findings, probably with some collateral flow via the posterior communicating arteries. MCA and ACA origins are within normal limits. Tortuous A1 segments. Anterior communicating artery and visualized ACA branches are within normal limits. Right MCA origin is normal. Early right MCA bifurcation. Visualized right MCA branches are with normal  limits. Left MCA origin, M1 segment, for K shin, and visualized left MCA branches are within normal limits. IMPRESSION: 1. No carotid stenosis in the neck. Moderate severe bilateral supraclinoid ICA stenosis, perhaps greater on the left. 2. Despite that, both carotid termini remain patent and appear normal - probably in part due to posterior communicating artery collateral flow. 3. No left MCA stenosis or occlusion identified. 4. Up to moderate proximal left vertebral artery stenosis in the neck. Mild to moderate irregularity and stenosis in the distal left PCA (P3 segment). 5. Otherwise negative posterior circulation. Electronically Signed   By: Genevie Ann M.D.   On: 01/23/2016 18:42   Mr Angiogram Neck Wo Contrast  01/23/2016  CLINICAL DATA:  60 year old male with left eye visual changes found to have acute left MCA infarcts, and possible left supraclinoid ICA stenosis on brain MRI yesterday. Initial encounter. EXAM: MRA NECK WITHOUT CONTRAST MRA HEAD WITHOUT CONTRAST TECHNIQUE: Angiographic images of the neck were obtained using MRA technique without intravenous contast.;  Angiographic images of the Circle of Willis were obtained using MRA technique without intravenous contrast. COMPARISON:  Brain MRI 01/22/2016, head CT without contrast 01/21/2016. FINDINGS: MRA NECK FINDINGS Time-of-flight neck MRA imaging reveals antegrade flow in both carotid and vertebral arteries in the neck. The carotid bifurcations appear normal. The vertebral arteries are codominant. The great vessel origins are not well evaluated in the absence of contrast, but no proximal right vertebral artery or bilateral common carotid stenosis is identified. There is mild to moderate stenosis suggested in the left vertebral artery at the junction of the V1 and V2 segments (series 805, image 11). Otherwise no vertebral artery stenosis in the neck. No cervical ICA stenosis. MRA HEAD FINDINGS Antegrade flow in the posterior circulation with codominant distal vertebral arteries. Patent vertebrobasilar junction and bilateral AICA origins. No basilar stenosis. SCA and PCA origins are within normal limits. Both posterior communicating arteries are present. Right PCA branches are within normal limits. There is mild to moderate irregularity and stenosis in the superior division of the left PCA P3 segment (series 307, image 5). Antegrade flow in both ICA siphons. Siphon patency appears normal to the distal cavernous segments. Both supraclinoid ICAs appear irregular and stenotic, with moderate to severe bilateral supraclinoid stenosis which appears somewhat greater on the left. Both ophthalmic artery origins remain patent. Both posterior communicating artery origins remain patent. Both carotid termini remain patent despite these findings, probably with some collateral flow via the posterior communicating arteries. MCA and ACA origins are within normal limits. Tortuous A1 segments. Anterior communicating artery and visualized ACA branches are within normal limits. Right MCA origin is normal. Early right MCA bifurcation. Visualized  right MCA branches are with normal limits. Left MCA origin, M1 segment, for K shin, and visualized left MCA branches are within normal limits. IMPRESSION: 1. No carotid stenosis in the neck. Moderate severe bilateral supraclinoid ICA stenosis, perhaps greater on the left. 2. Despite that, both carotid termini remain patent and appear normal - probably in part due to posterior communicating artery collateral flow. 3. No left MCA stenosis or occlusion identified. 4. Up to moderate proximal left vertebral artery stenosis in the neck. Mild to moderate irregularity and stenosis in the distal left PCA (P3 segment). 5. Otherwise negative posterior circulation. Electronically Signed   By: Genevie Ann M.D.   On: 01/23/2016 18:42   Mr Brain Wo Contrast  01/22/2016  CLINICAL DATA:  CVA with blurry vision in the left eye. EXAM: MRI HEAD WITHOUT  CONTRAST TECHNIQUE: Multiplanar, multiecho pulse sequences of the brain and surrounding structures were obtained without intravenous contrast. COMPARISON:  Head CT from yesterday FINDINGS: Calvarium and upper cervical spine: No focal marrow signal abnormality. Orbits: Negative. Sinuses and Mastoids: Clear. Brain: Patchy restricted diffusion in the left frontal parietal cortex and subcortical white matter at the vertex, extending in the parietal region to the periventricular white matter where there is the largest confluent area of infarct measuring 15 mm. These are in the MCA and posterior border zone territories. No acute superimposed hemorrhage (susceptibility artifact along the posterior body of the left lateral ventricle is small nodular calcification based on previous head CT. Major intracranial vessels show no signs of occlusion, but there is asymmetric heterogeneous signal of the left supraclinoid ICA seen on both axial and coronal T2 weighted imaging. There is a remote perforator infarct in the right putamen and genu internal capsule with wallerian degeneration in the right  cortical spinal tract. Mild small vessel ischemic change in the bilateral pons. Patchy small vessel ischemic injury elsewhere in the cerebral white matter. Normal cerebral volume. No evidence of mass lesion or hydrocephalus. IMPRESSION: 1. Scattered acute infarction in the left frontal parietal cortex and white matter, MCA distribution. No hemorrhagic conversion. 2. Asymmetric appearance of the left supraclinoid ICA which could reflect stenotic lesion. Suggest MRA 3. Chronic small vessel disease and remote right basal ganglia infarct. Electronically Signed   By: Monte Fantasia M.D.   On: 01/22/2016 16:22   Medications: Infusions:    Scheduled Medications: . aspirin  81 mg Oral Daily  . atorvastatin  40 mg Oral q1800  . carvedilol  12.5 mg Oral BID WC  . clopidogrel  75 mg Oral Daily  . ferric gluconate (FERRLECIT/NULECIT) IV  125 mg Intravenous Daily  . furosemide  80 mg Oral BID  . heparin  5,000 Units Subcutaneous 3 times per day  . LORazepam  1 mg Intravenous Once  . sodium chloride flush  3 mL Intravenous Q12H    have reviewed scheduled and prn medications.  Physical Exam: General: NAD Heart: RRR Lungs: mostly clear Abdomen: soft,non tender Extremities: pitting edema    01/24/2016,9:58 AM

## 2016-01-29 ENCOUNTER — Ambulatory Visit: Payer: PRIVATE HEALTH INSURANCE | Attending: Internal Medicine | Admitting: Internal Medicine

## 2016-01-29 ENCOUNTER — Encounter: Payer: Self-pay | Admitting: Internal Medicine

## 2016-01-29 VITALS — BP 121/69 | HR 72 | Temp 98.5°F | Resp 16 | Ht 64.0 in | Wt 178.0 lb

## 2016-01-29 DIAGNOSIS — Z79899 Other long term (current) drug therapy: Secondary | ICD-10-CM | POA: Diagnosis not present

## 2016-01-29 DIAGNOSIS — Z7982 Long term (current) use of aspirin: Secondary | ICD-10-CM | POA: Diagnosis not present

## 2016-01-29 DIAGNOSIS — I10 Essential (primary) hypertension: Secondary | ICD-10-CM | POA: Diagnosis not present

## 2016-01-29 DIAGNOSIS — N186 End stage renal disease: Secondary | ICD-10-CM | POA: Diagnosis not present

## 2016-01-29 DIAGNOSIS — I160 Hypertensive urgency: Secondary | ICD-10-CM | POA: Diagnosis not present

## 2016-01-29 DIAGNOSIS — Z992 Dependence on renal dialysis: Secondary | ICD-10-CM

## 2016-01-29 LAB — CBC WITH DIFFERENTIAL/PLATELET
BASOS ABS: 0 10*3/uL (ref 0.0–0.1)
Basophils Relative: 0 % (ref 0–1)
EOS PCT: 4 % (ref 0–5)
Eosinophils Absolute: 0.3 10*3/uL (ref 0.0–0.7)
HEMATOCRIT: 29.8 % — AB (ref 39.0–52.0)
HEMOGLOBIN: 9.6 g/dL — AB (ref 13.0–17.0)
LYMPHS ABS: 1.4 10*3/uL (ref 0.7–4.0)
LYMPHS PCT: 18 % (ref 12–46)
MCH: 25.9 pg — ABNORMAL LOW (ref 26.0–34.0)
MCHC: 32.2 g/dL (ref 30.0–36.0)
MCV: 80.5 fL (ref 78.0–100.0)
MPV: 9.3 fL (ref 8.6–12.4)
Monocytes Absolute: 0.7 10*3/uL (ref 0.1–1.0)
Monocytes Relative: 9 % (ref 3–12)
NEUTROS ABS: 5.4 10*3/uL (ref 1.7–7.7)
Neutrophils Relative %: 69 % (ref 43–77)
Platelets: 485 10*3/uL — ABNORMAL HIGH (ref 150–400)
RBC: 3.7 MIL/uL — AB (ref 4.22–5.81)
RDW: 17.2 % — AB (ref 11.5–15.5)
WBC: 7.8 10*3/uL (ref 4.0–10.5)

## 2016-01-29 LAB — COMPREHENSIVE METABOLIC PANEL
ALBUMIN: 2.9 g/dL — AB (ref 3.6–5.1)
ALK PHOS: 69 U/L (ref 40–115)
ALT: 19 U/L (ref 9–46)
AST: 14 U/L (ref 10–35)
BILIRUBIN TOTAL: 0.3 mg/dL (ref 0.2–1.2)
BUN: 69 mg/dL — ABNORMAL HIGH (ref 7–25)
CALCIUM: 7.4 mg/dL — AB (ref 8.6–10.3)
CO2: 21 mmol/L (ref 20–31)
Chloride: 93 mmol/L — ABNORMAL LOW (ref 98–110)
Creat: 8.8 mg/dL — ABNORMAL HIGH (ref 0.70–1.33)
GLUCOSE: 204 mg/dL — AB (ref 65–99)
POTASSIUM: 4.5 mmol/L (ref 3.5–5.3)
Sodium: 129 mmol/L — ABNORMAL LOW (ref 135–146)
TOTAL PROTEIN: 5.3 g/dL — AB (ref 6.1–8.1)

## 2016-01-29 MED ORDER — HYDRALAZINE HCL 100 MG PO TABS: 50.0000 mg | ORAL_TABLET | Freq: Three times a day (TID) | ORAL | Status: DC

## 2016-01-29 NOTE — Patient Instructions (Signed)
Follow new instructions for hydralazine.  Call if your symptoms of low blood pressure.

## 2016-01-29 NOTE — Progress Notes (Signed)
Patient's here for hospital f/up for HTN. Patient denies any pain  Patient reports feeling dizzy, denies any falls.   Patient declines diabetes screening  Patient comes in for post hospital follow-up. He generally is feeling well but has noted some weakness over the past 2-3 days. He went to the hospital with a hypertensive urgency. He has not monitored his blood pressure at home. He has been compliant with medications.  Past Medical History  Diagnosis Date  . Hypertension     Social History   Social History  . Marital Status: Single    Spouse Name: N/A  . Number of Children: N/A  . Years of Education: N/A   Occupational History  . Not on file.   Social History Main Topics  . Smoking status: Never Smoker   . Smokeless tobacco: Not on file  . Alcohol Use: Not on file  . Drug Use: Not on file  . Sexual Activity: Not on file   Other Topics Concern  . Not on file   Social History Narrative    History reviewed. No pertinent past surgical history.  History reviewed. No pertinent family history.  No Known Allergies  Current Outpatient Prescriptions on File Prior to Visit  Medication Sig Dispense Refill  . amLODipine (NORVASC) 10 MG tablet Take 1 tablet (10 mg total) by mouth daily. 30 tablet 0  . aspirin 81 MG chewable tablet Chew 1 tablet (81 mg total) by mouth daily. 30 tablet 5  . atorvastatin (LIPITOR) 40 MG tablet Take 1 tablet (40 mg total) by mouth daily at 6 PM. 30 tablet 0  . carvedilol (COREG) 12.5 MG tablet Take 1 tablet (12.5 mg total) by mouth 2 (two) times daily with a meal. 60 tablet 0  . clopidogrel (PLAVIX) 75 MG tablet Take 1 tablet (75 mg total) by mouth daily. 30 tablet 3  . furosemide (LASIX) 80 MG tablet Take 1 tablet (80 mg total) by mouth 2 (two) times daily. 60 tablet 0   No current facility-administered medications on file prior to visit.     patient denies chest pain, shortness of breath, orthopnea. Denies lower extremity edema, abdominal  pain, change in appetite, change in bowel movements. Patient denies rashes, musculoskeletal complaints. No other specific complaints in a complete review of systems.   BP 121/69 mmHg  Pulse 72  Temp(Src) 98.5 F (36.9 C) (Oral)  Resp 16  Ht 5\' 4"  (1.626 m)  Wt 178 lb (80.74 kg)  BMI 30.54 kg/m2  SpO2 100%  well-developed well-nourished male in no acute distress. HEENT exam atraumatic, normocephalic, neck supple without jugular venous distention. Chest clear to auscultation cardiac exam S1-S2 are regular. Abdominal exam overweight with bowel sounds, soft and nontender. Extremities no edema. Neurologic exam is alert with a normal gait.   Assessment and plan: Patient with hypertensive urgency. I think his blood pressures now overtreated. He is orthostatic when he stands up. He has symptoms. I will decrease hydralazine to 50 mg 3 times a day instead of 100 mg 3 times a day. He will come back in 1 week for blood pressure monitoring.

## 2016-01-30 ENCOUNTER — Telehealth: Payer: Self-pay | Admitting: *Deleted

## 2016-01-30 NOTE — Telephone Encounter (Signed)
Patient verified DOB MA asked patient if he is on dialysis. Patient denies having dialysis treatment. Patient is driving to [place] at this time and stated he would do his best to be back in town before the office close at 6. Patient also stated he has availability tomorrow morning as well. Patient informed of information being shared with the doctor and patient receiving a FU call regarding when to come into the office. Patient expressed his understanding and had no further questions at this time.

## 2016-02-07 ENCOUNTER — Encounter (HOSPITAL_COMMUNITY): Payer: Self-pay | Admitting: *Deleted

## 2016-02-07 DIAGNOSIS — Z7902 Long term (current) use of antithrombotics/antiplatelets: Secondary | ICD-10-CM | POA: Diagnosis not present

## 2016-02-07 DIAGNOSIS — Z87448 Personal history of other diseases of urinary system: Secondary | ICD-10-CM | POA: Insufficient documentation

## 2016-02-07 DIAGNOSIS — K5900 Constipation, unspecified: Secondary | ICD-10-CM | POA: Insufficient documentation

## 2016-02-07 DIAGNOSIS — I10 Essential (primary) hypertension: Secondary | ICD-10-CM | POA: Insufficient documentation

## 2016-02-07 DIAGNOSIS — Z79899 Other long term (current) drug therapy: Secondary | ICD-10-CM | POA: Insufficient documentation

## 2016-02-07 DIAGNOSIS — Z7982 Long term (current) use of aspirin: Secondary | ICD-10-CM | POA: Insufficient documentation

## 2016-02-07 NOTE — ED Notes (Signed)
Pt in c/o constipation x2 days, reports abdominal distention and rectal discomfort, pt was unsure of what he could take at home for this due to his advanced renal disease. No distress noted

## 2016-02-08 ENCOUNTER — Emergency Department (HOSPITAL_COMMUNITY)
Admission: EM | Admit: 2016-02-08 | Discharge: 2016-02-08 | Disposition: A | Discharge status: Home / Self Care | Payer: BLUE CROSS/BLUE SHIELD | Attending: Emergency Medicine | Admitting: Emergency Medicine

## 2016-02-08 DIAGNOSIS — K5900 Constipation, unspecified: Secondary | ICD-10-CM

## 2016-02-08 MED ORDER — POLYETHYLENE GLYCOL 3350 17 GM/SCOOP PO POWD: 17.0000 g | Freq: Every day | ORAL | Status: DC

## 2016-02-08 NOTE — Discharge Instructions (Signed)

## 2016-02-08 NOTE — ED Provider Notes (Signed)
CSN: [id number]     Arrival date & time 02/07/16  2043 History  By signing my name below, I, Hansel Feinstein, attest that this documentation has been prepared under the direction and in the presence of Jola Schmidt, MD. Electronically Signed: Hansel Feinstein, ED Scribe. 02/08/2016. 2:04 AM.    Chief Complaint  Patient presents with  . Constipation   The history is provided by the patient. No language interpreter was used.   HPI Comments: Reiley Curtain is a 60 y.o. male with h/o HTN who presents to the Emergency Department complaining of moderate constipation for a week with associated decreased appetite, abdominal distention Pt states he feels like he has to have a bowel movement but is not able to pass stool. No Hx of similar symptoms. Pt denies taking OTC medications at home to improve symptoms. Pt was recently admitted to the hospital for mild stroke and kidney disease, but states he was not started on any pain medications. He states the only deficit from his stroke is his vision.   Past Medical History  Diagnosis Date  . Hypertension   . Renal disorder    History reviewed. No pertinent past surgical history. History reviewed. No pertinent family history. Social History  Substance Use Topics  . Smoking status: Never Smoker   . Smokeless tobacco: None  . Alcohol Use: None    Review of Systems A complete 10 system review of systems was obtained and all systems are negative except as noted in the HPI and PMH.    Allergies  Review of patient's allergies indicates no known allergies.  Home Medications   Prior to Admission medications   Medication Sig Start Date End Date Taking? Authorizing Provider  amLODipine (NORVASC) 10 MG tablet Take 1 tablet (10 mg total) by mouth daily. 01/26/16   Thurnell Lose, MD  aspirin 81 MG chewable tablet Chew 1 tablet (81 mg total) by mouth daily. 01/24/16   Thurnell Lose, MD  atorvastatin (LIPITOR) 40 MG tablet Take 1 tablet (40 mg total) by mouth  daily at 6 PM. 01/24/16   Thurnell Lose, MD  carvedilol (COREG) 12.5 MG tablet Take 1 tablet (12.5 mg total) by mouth 2 (two) times daily with a meal. 01/24/16   Thurnell Lose, MD  clopidogrel (PLAVIX) 75 MG tablet Take 1 tablet (75 mg total) by mouth daily. 01/24/16   Thurnell Lose, MD  furosemide (LASIX) 80 MG tablet Take 1 tablet (80 mg total) by mouth 2 (two) times daily. 01/24/16   Thurnell Lose, MD  hydrALAZINE (APRESOLINE) 100 MG tablet Take 0.5 tablets (50 mg total) by mouth 3 (three) times daily. 01/29/16   Darrick Penna Swords, MD   BP 142/80 mmHg  Pulse 80  Temp(Src) 98 F (36.7 C) (Oral)  Resp 20  SpO2 99% Physical Exam  Constitutional: He is oriented to person, place, and time. He appears well-developed and well-nourished.  HENT:  Head: Normocephalic and atraumatic.  Eyes: EOM are normal.  Neck: Normal range of motion.  Cardiovascular: Normal rate and regular rhythm.   Pulmonary/Chest: Effort normal and breath sounds normal. No respiratory distress.  Abdominal: Soft. He exhibits no distension. There is no tenderness.  Genitourinary:  No gross blood on rectal exam.  No obvious distal fecal impaction noted    Musculoskeletal: Normal range of motion.  Neurological: He is alert and oriented to person, place, and time.  Skin: Skin is warm and dry.  Psychiatric: He has a normal mood and  affect. Judgment normal.  Nursing note and vitals reviewed.   ED Course  Procedures (including critical care time) DIAGNOSTIC STUDIES: Oxygen Saturation is 99% on RA, normal by my interpretation.    COORDINATION OF CARE: 1:51 AM Discussed treatment plan with pt at bedside and pt agreed to plan.   Labs Review Labs Reviewed - No data to display  Imaging Review No results found. I have personally reviewed and evaluated these images and lab results as part of my medical decision-making.   MDM   Final diagnoses:  None    Constipation.  Patient be placed on MiraLAX twice a day.   Primary care follow-up.  Overall well-appearing.  Abdominal exam is benign.  I personally performed the services described in this documentation, which was scribed in my presence. The recorded information has been reviewed and is accurate.       Jola Schmidt, MD 02/09/16 [phone]

## 2016-02-08 NOTE — ED Notes (Signed)
Pt stable, ambulatory, states understanding of discharge instructions 

## 2016-02-17 ENCOUNTER — Encounter: Payer: Self-pay | Admitting: Surgery

## 2016-02-17 ENCOUNTER — Other Ambulatory Visit: Payer: Self-pay | Admitting: *Deleted

## 2016-02-17 DIAGNOSIS — N185 Chronic kidney disease, stage 5: Secondary | ICD-10-CM

## 2016-02-17 DIAGNOSIS — Z01810 Encounter for preprocedural cardiovascular examination: Secondary | ICD-10-CM

## 2016-02-18 ENCOUNTER — Ambulatory Visit: Payer: PRIVATE HEALTH INSURANCE | Admitting: Family Medicine

## 2016-02-18 ENCOUNTER — Ambulatory Visit: Payer: PRIVATE HEALTH INSURANCE | Admitting: Diagnostic Neuroimaging

## 2016-02-19 ENCOUNTER — Encounter: Payer: Self-pay | Admitting: Diagnostic Neuroimaging

## 2016-02-20 ENCOUNTER — Other Ambulatory Visit (HOSPITAL_COMMUNITY): Payer: Self-pay | Admitting: *Deleted

## 2016-02-21 ENCOUNTER — Other Ambulatory Visit: Payer: Self-pay

## 2016-02-21 ENCOUNTER — Ambulatory Visit (INDEPENDENT_AMBULATORY_CARE_PROVIDER_SITE_OTHER): Payer: PRIVATE HEALTH INSURANCE | Admitting: Surgery

## 2016-02-21 ENCOUNTER — Encounter: Payer: Self-pay | Admitting: Surgery

## 2016-02-21 ENCOUNTER — Ambulatory Visit (INDEPENDENT_AMBULATORY_CARE_PROVIDER_SITE_OTHER)
Admission: RE | Admit: 2016-02-21 | Discharge: 2016-02-21 | Disposition: A | Discharge status: Home / Self Care | Payer: BLUE CROSS/BLUE SHIELD | Source: Ambulatory Visit | Attending: Surgery | Admitting: Surgery

## 2016-02-21 ENCOUNTER — Ambulatory Visit (HOSPITAL_COMMUNITY): Payer: PRIVATE HEALTH INSURANCE | Attending: Nephrology

## 2016-02-21 ENCOUNTER — Ambulatory Visit (HOSPITAL_COMMUNITY)
Admission: RE | Admit: 2016-02-21 | Discharge: 2016-02-21 | Disposition: A | Discharge status: Home / Self Care | Payer: BLUE CROSS/BLUE SHIELD | Source: Ambulatory Visit | Attending: Surgery | Admitting: Surgery

## 2016-02-21 VITALS — BP 144/75 | HR 70 | Temp 97.4°F | Resp 14 | Ht 64.0 in | Wt 202.5 lb

## 2016-02-21 DIAGNOSIS — I120 Hypertensive chronic kidney disease with stage 5 chronic kidney disease or end stage renal disease: Secondary | ICD-10-CM

## 2016-02-21 DIAGNOSIS — E1122 Type 2 diabetes mellitus with diabetic chronic kidney disease: Secondary | ICD-10-CM | POA: Diagnosis not present

## 2016-02-21 DIAGNOSIS — Z01810 Encounter for preprocedural cardiovascular examination: Secondary | ICD-10-CM

## 2016-02-21 DIAGNOSIS — N185 Chronic kidney disease, stage 5: Secondary | ICD-10-CM

## 2016-02-21 DIAGNOSIS — N186 End stage renal disease: Secondary | ICD-10-CM

## 2016-02-21 DIAGNOSIS — Z992 Dependence on renal dialysis: Secondary | ICD-10-CM | POA: Diagnosis not present

## 2016-02-21 NOTE — Progress Notes (Signed)
Patient name: Allen Miller MRN: [id number] DOB: 1956-03-31 Sex: male   Referred by: Dr. Moshe Cipro  Reason for referral:  Chief Complaint  Patient presents with  . Follow-up    HISTORY OF PRESENT ILLNESS: This is a 60 year old right-handed gentleman who is referred today for evaluation of permanent dialysis access.  His renal failure secondary to diabetes.  His most recent creatinine clearance was less than 10.  The patient has a history of hypertension.  He also has a history of stroke.  He is on a statin for hypercholesterolemia.  He is a nonsmoker and a nondrinker.   Past Medical History  Diagnosis Date  . Hypertension   . Renal disorder     No past surgical history on file.  Social History   Social History  . Marital Status: Single    Spouse Name: N/A  . Number of Children: N/A  . Years of Education: N/A   Occupational History  . Not on file.   Social History Main Topics  . Smoking status: Never Smoker   . Smokeless tobacco: Not on file  . Alcohol Use: Not on file  . Drug Use: Not on file  . Sexual Activity: Not on file   Other Topics Concern  . Not on file   Social History Narrative    No family history on file.  Allergies as of 02/21/2016  . (No Known Allergies)    Current Outpatient Prescriptions on File Prior to Visit  Medication Sig Dispense Refill  . amLODipine (NORVASC) 10 MG tablet Take 1 tablet (10 mg total) by mouth daily. 30 tablet 0  . aspirin 81 MG chewable tablet Chew 1 tablet (81 mg total) by mouth daily. 30 tablet 5  . atorvastatin (LIPITOR) 40 MG tablet Take 1 tablet (40 mg total) by mouth daily at 6 PM. 30 tablet 0  . carvedilol (COREG) 12.5 MG tablet Take 1 tablet (12.5 mg total) by mouth 2 (two) times daily with a meal. 60 tablet 0  . clopidogrel (PLAVIX) 75 MG tablet Take 1 tablet (75 mg total) by mouth daily. 30 tablet 3  . furosemide (LASIX) 80 MG tablet Take 1 tablet (80 mg total) by mouth 2 (two) times daily. 60  tablet 0  . hydrALAZINE (APRESOLINE) 100 MG tablet Take 0.5 tablets (50 mg total) by mouth 3 (three) times daily. 90 tablet 0  . polyethylene glycol powder (MIRALAX) powder Take 17 g by mouth daily. Twice a day x 2 days, then daily, followed by daily PRN once regular bowel movements 255 g 0   No current facility-administered medications on file prior to visit.     REVIEW OF SYSTEMS: Cardiovascular: No chest pain, chest pressure, palpitations, orthopnea, or dyspnea on exertion. No claudication or rest pain,  Pulmonary: No productive cough, asthma or wheezing. Neurologic: No weakness, paresthesias, aphasia, or amaurosis. No dizziness. Hematologic: No bleeding problems or clotting disorders. Musculoskeletal: No joint pain or joint swelling. Gastrointestinal: No blood in stool or hematemesis Genitourinary: No dysuria or hematuria. Psychiatric:: No history of major depression. Integumentary: No rashes or ulcers. Constitutional: No fever or chills.  PHYSICAL EXAMINATION:  Filed Vitals:   02/21/16 1611  BP: 144/75  Pulse: 70  Temp: 97.4 F (36.3 C)  TempSrc: Oral  Resp: 14  Height: 5\' 4"  (1.626 m)  Weight: 202 lb 8 oz (91.853 kg)  SpO2: 98%   Body mass index is 34.74 kg/(m^2). General: The patient appears their stated age.   HEENT:  No  gross abnormalities Pulmonary: Respirations are non-labored Musculoskeletal: There are no major deformities.   Neurologic: No focal weakness or paresthesias are detected, Skin: There are no ulcer or rashes noted. Psychiatric: The patient has normal affect. Cardiovascular: There is a regular rate and rhythm without significant murmur appreciated.Palpable radial and brachial pulses bilaterally  Diagnostic Studies: I have reviewed his vein mapping.  He has a marginal left basilic vein measuring 0.2 at the antecubital crease but goes up to 0.3 to the upper arm.  Arterial waveforms are normal.    Assessment:  Chronic renal insufficiency, stage  V Plan: The patient needs dialysis access in the immediate future.  Nephrology is requesting that we place permanent access in the extremity as well as a tunneled catheter.  After reviewing the patient's vein mapping, I discussed with him that he may require a graft.  I discussed 3 surgical options with him #1 would be a first stage left basilic vein transposition, understanding that if the vein matured appropriately he would need a second stage.  #2 would be a left sided dialysis graft either in his forearm or his upper arm.  #3 would be left basilic vein transposition done in one setting.  He would also require a tunneled dialysis catheter, most likely on the right side.  I discussed the risks and benefits of the operation with the patient and his son.  He understands risk of non-maturity the need for future interventions and the risk of steal syndrome, as well as risk for bleeding and cardiac complications.  The patient is on Plavix.  I'm stopping that today so that he can have his procedure on Monday as I am not available to do this currently one of my partners will take care of him.  The patient is in agreement with this.     Eldridge Abrahams, M.D. [facility] Office: [phone] Pager:  [phone]

## 2016-02-24 ENCOUNTER — Ambulatory Visit (HOSPITAL_COMMUNITY): Payer: BLUE CROSS/BLUE SHIELD

## 2016-02-24 ENCOUNTER — Ambulatory Visit (HOSPITAL_COMMUNITY)
Admission: RE | Admit: 2016-02-24 | Discharge: 2016-02-24 | Disposition: A | Discharge status: Home / Self Care | Payer: BLUE CROSS/BLUE SHIELD | Source: Ambulatory Visit | Attending: Vascular Surgery | Admitting: Vascular Surgery

## 2016-02-24 ENCOUNTER — Encounter (HOSPITAL_COMMUNITY): Payer: Self-pay | Admitting: *Deleted

## 2016-02-24 ENCOUNTER — Ambulatory Visit (HOSPITAL_COMMUNITY): Payer: BLUE CROSS/BLUE SHIELD | Admitting: Anesthesiology

## 2016-02-24 ENCOUNTER — Encounter (HOSPITAL_COMMUNITY)
Admission: RE | Disposition: A | Discharge status: Home / Self Care | Payer: Self-pay | Source: Ambulatory Visit | Attending: Vascular Surgery

## 2016-02-24 ENCOUNTER — Other Ambulatory Visit: Payer: Self-pay | Admitting: *Deleted

## 2016-02-24 DIAGNOSIS — Z419 Encounter for procedure for purposes other than remedying health state, unspecified: Secondary | ICD-10-CM

## 2016-02-24 DIAGNOSIS — E1122 Type 2 diabetes mellitus with diabetic chronic kidney disease: Secondary | ICD-10-CM | POA: Diagnosis not present

## 2016-02-24 DIAGNOSIS — N185 Chronic kidney disease, stage 5: Secondary | ICD-10-CM | POA: Diagnosis not present

## 2016-02-24 DIAGNOSIS — E7800 Pure hypercholesterolemia, unspecified: Secondary | ICD-10-CM | POA: Insufficient documentation

## 2016-02-24 DIAGNOSIS — Z8673 Personal history of transient ischemic attack (TIA), and cerebral infarction without residual deficits: Secondary | ICD-10-CM | POA: Diagnosis not present

## 2016-02-24 DIAGNOSIS — Z992 Dependence on renal dialysis: Secondary | ICD-10-CM

## 2016-02-24 DIAGNOSIS — N186 End stage renal disease: Secondary | ICD-10-CM | POA: Diagnosis not present

## 2016-02-24 DIAGNOSIS — Z7902 Long term (current) use of antithrombotics/antiplatelets: Secondary | ICD-10-CM | POA: Insufficient documentation

## 2016-02-24 DIAGNOSIS — I120 Hypertensive chronic kidney disease with stage 5 chronic kidney disease or end stage renal disease: Secondary | ICD-10-CM | POA: Insufficient documentation

## 2016-02-24 DIAGNOSIS — Z79899 Other long term (current) drug therapy: Secondary | ICD-10-CM | POA: Insufficient documentation

## 2016-02-24 DIAGNOSIS — Z4931 Encounter for adequacy testing for hemodialysis: Secondary | ICD-10-CM

## 2016-02-24 DIAGNOSIS — Z7982 Long term (current) use of aspirin: Secondary | ICD-10-CM | POA: Insufficient documentation

## 2016-02-24 HISTORY — DX: Reserved for inherently not codable concepts without codable children: IMO0001

## 2016-02-24 HISTORY — PX: BASCILIC VEIN TRANSPOSITION: SHX5742

## 2016-02-24 HISTORY — DX: Cerebral infarction, unspecified: I63.9

## 2016-02-24 HISTORY — PX: INSERTION OF DIALYSIS CATHETER: SHX1324

## 2016-02-24 LAB — POCT I-STAT 4, (NA,K, GLUC, HGB,HCT)
Glucose, Bld: 100 mg/dL — ABNORMAL HIGH (ref 65–99)
HEMATOCRIT: 25 % — AB (ref 39.0–52.0)
HEMOGLOBIN: 8.5 g/dL — AB (ref 13.0–17.0)
Potassium: 5.5 mmol/L — ABNORMAL HIGH (ref 3.5–5.1)
Sodium: 141 mmol/L (ref 135–145)

## 2016-02-24 SURGERY — TRANSPOSITION, VEIN, BASILIC
Anesthesia: General

## 2016-02-24 MED ORDER — 0.9 % SODIUM CHLORIDE (POUR BTL) OPTIME
TOPICAL | Status: DC | PRN
Start: 2016-02-24 — End: 2016-02-24
  Administered 2016-02-24: 1000 mL

## 2016-02-24 MED ORDER — CARVEDILOL 12.5 MG PO TABS
12.5000 mg | ORAL_TABLET | Freq: Once | ORAL | Status: AC
  Administered 2016-02-24: 12.5 mg via ORAL
  Filled 2016-02-24: qty 1

## 2016-02-24 MED ORDER — PHENYLEPHRINE HCL 10 MG/ML IJ SOLN
INTRAMUSCULAR | Status: DC | PRN
  Administered 2016-02-24 (×5): 80 ug via INTRAVENOUS

## 2016-02-24 MED ORDER — OXYCODONE HCL 5 MG PO TABS
5.0000 mg | ORAL_TABLET | Freq: Once | ORAL | Status: AC | PRN
  Administered 2016-02-24: 5 mg via ORAL

## 2016-02-24 MED ORDER — OXYCODONE HCL 5 MG/5ML PO SOLN: 5.0000 mg | Freq: Once | ORAL | Status: AC | PRN

## 2016-02-24 MED ORDER — OXYCODONE HCL 5 MG PO TABS: 5.0000 mg | ORAL_TABLET | Freq: Four times a day (QID) | ORAL | Status: DC | PRN

## 2016-02-24 MED ORDER — FENTANYL CITRATE (PF) 100 MCG/2ML IJ SOLN
INTRAMUSCULAR | Status: DC | PRN
  Administered 2016-02-24: 50 ug via INTRAVENOUS
  Administered 2016-02-24: 25 ug via INTRAVENOUS

## 2016-02-24 MED ORDER — ONDANSETRON HCL 4 MG/2ML IJ SOLN
INTRAMUSCULAR | Status: DC | PRN
  Administered 2016-02-24: 4 mg via INTRAVENOUS

## 2016-02-24 MED ORDER — HEPARIN SODIUM (PORCINE) 1000 UNIT/ML IJ SOLN
INTRAMUSCULAR | Status: DC | PRN
  Administered 2016-02-24: 4000 [IU]

## 2016-02-24 MED ORDER — PROPOFOL 10 MG/ML IV BOLUS
INTRAVENOUS | Status: DC | PRN
  Administered 2016-02-24: 120 mg via INTRAVENOUS

## 2016-02-24 MED ORDER — SODIUM CHLORIDE 0.9 % IJ SOLN
INTRAMUSCULAR | Status: AC
  Filled 2016-02-24: qty 10

## 2016-02-24 MED ORDER — OXYCODONE HCL 5 MG PO TABS
ORAL_TABLET | ORAL | Status: AC
  Administered 2016-02-24: 5 mg via ORAL
  Filled 2016-02-24: qty 1

## 2016-02-24 MED ORDER — FENTANYL CITRATE (PF) 100 MCG/2ML IJ SOLN: 25.0000 ug | INTRAMUSCULAR | Status: DC | PRN

## 2016-02-24 MED ORDER — PROMETHAZINE HCL 25 MG/ML IJ SOLN: 6.2500 mg | INTRAMUSCULAR | Status: DC | PRN

## 2016-02-24 MED ORDER — ROCURONIUM BROMIDE 50 MG/5ML IV SOLN
INTRAVENOUS | Status: AC
  Filled 2016-02-24: qty 1

## 2016-02-24 MED ORDER — EPHEDRINE SULFATE 50 MG/ML IJ SOLN
INTRAMUSCULAR | Status: AC
  Filled 2016-02-24: qty 1

## 2016-02-24 MED ORDER — ARTIFICIAL TEARS OP OINT
TOPICAL_OINTMENT | OPHTHALMIC | Status: AC
  Filled 2016-02-24: qty 3.5

## 2016-02-24 MED ORDER — FENTANYL CITRATE (PF) 250 MCG/5ML IJ SOLN
INTRAMUSCULAR | Status: AC
  Filled 2016-02-24: qty 5

## 2016-02-24 MED ORDER — CHLORHEXIDINE GLUCONATE CLOTH 2 % EX PADS: 6.0000 | MEDICATED_PAD | Freq: Once | CUTANEOUS | Status: DC

## 2016-02-24 MED ORDER — PROPOFOL 10 MG/ML IV BOLUS
INTRAVENOUS | Status: AC
  Filled 2016-02-24: qty 20

## 2016-02-24 MED ORDER — DEXTROSE 5 % IV SOLN
1.5000 g | INTRAVENOUS | Status: AC
  Administered 2016-02-24: 1.5 g via INTRAVENOUS
  Filled 2016-02-24: qty 1.5

## 2016-02-24 MED ORDER — SODIUM CHLORIDE 0.9 % IV SOLN
INTRAVENOUS | Status: DC
  Administered 2016-02-24: 10:00:00 via INTRAVENOUS

## 2016-02-24 MED ORDER — EPHEDRINE SULFATE 50 MG/ML IJ SOLN
INTRAMUSCULAR | Status: DC | PRN
  Administered 2016-02-24: 5 mg via INTRAVENOUS
  Administered 2016-02-24 (×2): 10 mg via INTRAVENOUS
  Administered 2016-02-24 (×2): 5 mg via INTRAVENOUS

## 2016-02-24 MED ORDER — SODIUM CHLORIDE 0.9 % IV SOLN
INTRAVENOUS | Status: DC | PRN
  Administered 2016-02-24: 500 mL

## 2016-02-24 MED ORDER — LIDOCAINE HCL (CARDIAC) 20 MG/ML IV SOLN
INTRAVENOUS | Status: AC
  Filled 2016-02-24: qty 5

## 2016-02-24 MED ORDER — HEPARIN SODIUM (PORCINE) 1000 UNIT/ML IJ SOLN
INTRAMUSCULAR | Status: AC
  Filled 2016-02-24: qty 1

## 2016-02-24 MED ORDER — ONDANSETRON HCL 4 MG/2ML IJ SOLN
INTRAMUSCULAR | Status: AC
  Filled 2016-02-24: qty 2

## 2016-02-24 MED ORDER — MIDAZOLAM HCL 2 MG/2ML IJ SOLN
INTRAMUSCULAR | Status: AC
  Filled 2016-02-24: qty 2

## 2016-02-24 MED ORDER — PHENYLEPHRINE 40 MCG/ML (10ML) SYRINGE FOR IV PUSH (FOR BLOOD PRESSURE SUPPORT)
PREFILLED_SYRINGE | INTRAVENOUS | Status: AC
  Filled 2016-02-24: qty 10

## 2016-02-24 SURGICAL SUPPLY — 61 items
BAG DECANTER FOR FLEXI CONT (MISCELLANEOUS) ×4 IMPLANT
BIOPATCH RED 1 DISK 7.0 (GAUZE/BANDAGES/DRESSINGS) ×3 IMPLANT
BIOPATCH RED 1IN DISK 7.0MM (GAUZE/BANDAGES/DRESSINGS) ×1
CANISTER SUCTION 2500CC (MISCELLANEOUS) ×4 IMPLANT
CATH CANNON HEMO 15FR 23CM (HEMODIALYSIS SUPPLIES) ×4 IMPLANT
CATH PALINDROME RT-P 15FX19CM (CATHETERS) IMPLANT
CATH PALINDROME RT-P 15FX23CM (CATHETERS) IMPLANT
CATH PALINDROME RT-P 15FX28CM (CATHETERS) IMPLANT
CATH PALINDROME RT-P 15FX55CM (CATHETERS) IMPLANT
CATH STRAIGHT 5FR 65CM (CATHETERS) IMPLANT
CLIP TI MEDIUM 24 (CLIP) ×4 IMPLANT
CLIP TI WIDE RED SMALL 24 (CLIP) ×4 IMPLANT
CORDS BIPOLAR (ELECTRODE) IMPLANT
COVER PROBE W GEL 5X96 (DRAPES) ×4 IMPLANT
DECANTER SPIKE VIAL GLASS SM (MISCELLANEOUS) ×4 IMPLANT
DRAPE C-ARM 42X72 X-RAY (DRAPES) ×4 IMPLANT
DRAPE CHEST BREAST 15X10 FENES (DRAPES) ×8 IMPLANT
DRSG COVADERM 4X10 (GAUZE/BANDAGES/DRESSINGS) IMPLANT
DRSG COVADERM 4X8 (GAUZE/BANDAGES/DRESSINGS) IMPLANT
ELECT REM PT RETURN 9FT ADLT (ELECTROSURGICAL) ×4
ELECTRODE REM PT RTRN 9FT ADLT (ELECTROSURGICAL) ×2 IMPLANT
GAUZE SPONGE 2X2 8PLY STRL LF (GAUZE/BANDAGES/DRESSINGS) ×2 IMPLANT
GAUZE SPONGE 4X4 16PLY XRAY LF (GAUZE/BANDAGES/DRESSINGS) ×4 IMPLANT
GLOVE BIO SURGEON STRL SZ7 (GLOVE) ×4 IMPLANT
GLOVE BIOGEL PI IND STRL 7.5 (GLOVE) ×2 IMPLANT
GLOVE BIOGEL PI INDICATOR 7.5 (GLOVE) ×2
GOWN STRL REUS W/ TWL LRG LVL3 (GOWN DISPOSABLE) ×6 IMPLANT
GOWN STRL REUS W/TWL LRG LVL3 (GOWN DISPOSABLE) ×6
KIT BASIN OR (CUSTOM PROCEDURE TRAY) ×4 IMPLANT
KIT ROOM TURNOVER OR (KITS) ×4 IMPLANT
LIQUID BAND (GAUZE/BANDAGES/DRESSINGS) ×4 IMPLANT
NEEDLE 18GX1X1/2 (RX/OR ONLY) (NEEDLE) ×4 IMPLANT
NEEDLE HYPO 25GX1X1/2 BEV (NEEDLE) ×4 IMPLANT
NS IRRIG 1000ML POUR BTL (IV SOLUTION) ×4 IMPLANT
PACK CV ACCESS (CUSTOM PROCEDURE TRAY) ×4 IMPLANT
PACK SURGICAL SETUP 50X90 (CUSTOM PROCEDURE TRAY) ×4 IMPLANT
PAD ARMBOARD 7.5X6 YLW CONV (MISCELLANEOUS) ×8 IMPLANT
SET MICROPUNCTURE 5F STIFF (MISCELLANEOUS) IMPLANT
SOAP 2 % CHG 4 OZ (WOUND CARE) ×4 IMPLANT
SPONGE GAUZE 2X2 STER 10/PKG (GAUZE/BANDAGES/DRESSINGS) ×2
SPONGE SURGIFOAM ABS GEL 100 (HEMOSTASIS) IMPLANT
STAPLER VISISTAT 35W (STAPLE) IMPLANT
SUT ETHILON 3 0 PS 1 (SUTURE) ×4 IMPLANT
SUT MNCRL AB 4-0 PS2 18 (SUTURE) ×4 IMPLANT
SUT PROLENE 6 0 BV (SUTURE) ×4 IMPLANT
SUT PROLENE 6 0 CC (SUTURE) ×4 IMPLANT
SUT PROLENE 7 0 BV 1 (SUTURE) ×4 IMPLANT
SUT SILK 2 0 SH (SUTURE) IMPLANT
SUT VIC AB 2-0 CT1 27 (SUTURE) ×2
SUT VIC AB 2-0 CT1 TAPERPNT 27 (SUTURE) ×2 IMPLANT
SUT VIC AB 3-0 SH 27 (SUTURE) ×2
SUT VIC AB 3-0 SH 27X BRD (SUTURE) ×2 IMPLANT
SUT VICRYL 4-0 PS2 18IN ABS (SUTURE) ×4 IMPLANT
SYR 20CC LL (SYRINGE) ×8 IMPLANT
SYR 3ML LL SCALE MARK (SYRINGE) ×4 IMPLANT
SYR 5ML LL (SYRINGE) ×4 IMPLANT
SYR CONTROL 10ML LL (SYRINGE) ×4 IMPLANT
SYRINGE 10CC LL (SYRINGE) ×4 IMPLANT
UNDERPAD 30X30 INCONTINENT (UNDERPADS AND DIAPERS) ×4 IMPLANT
WATER STERILE IRR 1000ML POUR (IV SOLUTION) ×4 IMPLANT
WIRE AMPLATZ SS-J .035X180CM (WIRE) IMPLANT

## 2016-02-24 NOTE — Transfer of Care (Signed)
Immediate Anesthesia Transfer of Care Note  Patient: Allen Miller  Procedure(s) Performed: Procedure(s): BASILIC VEIN TRANSPOSITION VERSUS ARTERIOVENOUS GRAFT INSERTION (Left) INSERTION OF DIALYSIS CATHETER (N/A)  Patient Location: PACU  Anesthesia Type:General  Level of Consciousness: awake, alert  and oriented  Airway & Oxygen Therapy: Patient Spontanous Breathing and Patient connected to nasal cannula oxygen  Post-op Assessment: Report given to RN and Post -op Vital signs reviewed and stable  Post vital signs: Reviewed and stable  Last Vitals:  Filed Vitals:   02/24/16 1424 02/24/16 1430  BP: 144/78   Pulse: 72 72  Temp: 36.6 C   Resp: 12 17    Complications: No apparent anesthesia complications

## 2016-02-24 NOTE — Op Note (Signed)
    OPERATIVE REPORT  DATE OF SURGERY: 02/24/2016  PATIENT: Allen Miller, 60 y.o. male MRN: [id number]  DOB: [birth year]  PRE-OPERATIVE DIAGNOSIS: New onset end-stage renal disease  POST-OPERATIVE DIAGNOSIS:  Same  PROCEDURE: #1 right IJ hemodialysis catheter with SonoSite visualization, #2 left radiocephalic AV fistula creation  SURGEON:  Curt Jews, M.D.  PHYSICIAN ASSISTANT: Nurse  ANESTHESIA:  Gen.  EBL: Minimal ml  Total I/O In: 300 [I.V.:300] Out: -   BLOOD ADMINISTERED: None  DRAINS: None  SPECIMEN: None  COUNTS CORRECT:  YES  PLAN OF CARE: PACU with chest x-ray pending   PATIENT DISPOSITION:  PACU - hemodynamically stable  PROCEDURE DETAILS: Patient was taken to the operative placed supine position where the area of the left and right neck and chest prepped and draped in sterile fashion. Patient was placed in Trendelenburg position. Using SonoSite visualization the right internal jugular vein was easily accessed with an 18-gauge needle. Guidewire was passed centrally to the level the right atrium and this was confirmed with fluoroscopy. A dilator and peel-away sheath was passed over the guidewire and the dilator and guidewire removed. A 24 cm hemodialysis catheter was positioned to the level of the distal right atrium. The peel-away sheath was removed. The catheter was then brought through subcutaneous tunnel through a separate stab incision and the 2 lm ports were attached. Both lumens flushed and aspirated easily and were locked with 1000 unit per cc heparin. The catheter was secured to the skin with a 3-0 nylon stitch and the entry site was closed with a 4-0 subcuticular Vicryl suture. Sterile dressing was applied. Attention was then turned to the left arm. The veins were imaged with SonoSite ultrasound and this did show adequate caliber cephalic vein throughout the forearm and at the wrist for fistula creation. Patient had a normal radial pulse. And the left arm  and exam were prepped and draped in usual sterile fashion. Incision was made between the level of the cephalic vein and the radial artery at the wrist. The cephalic vein was mobilized and tributary branches were ligated with 4-0 silk ties and divided. The vein was ligated distally and divided. The vein was gently dilated with heparinized saline. The radial artery was exposed the same incision. The artery was occluded proximally and distally with Serafin clamps and the artery was opened with 11 blade and tenotomy with Potts scissors. The vein was cut to appropriate length and was spatulated and sewn end-to-side to the artery with a running 6-0 Prolene suture. Clamps were removed and good thrill was noted. The wound irrigated with saline. Hemostasis tablet cautery. Wounds were closed with 3-0 Vicryl the subcutaneous septic or tissue. Benzoin insertion for applied. Patient was transferred to the recovery room where chest x-ray pending   Curt Jews, M.D. 02/24/2016 2:27 PM

## 2016-02-24 NOTE — Progress Notes (Signed)
Notified Dr.Rose of I-stat results. No new orders.

## 2016-02-24 NOTE — Progress Notes (Signed)
Post-op CXR completed. Dr. Donnetta Hutching aware of results. Orders to d/c home.

## 2016-02-24 NOTE — Discharge Instructions (Signed)
° ° °  02/24/2016 Hezakiah Paladino [id number] 1956-04-30  Surgeon(s): [name], MD  Procedure(s): Left radial cephalic AV fistula INSERTION OF DIALYSIS CATHETER   Do not stick fistula for 12 weeks

## 2016-02-24 NOTE — Anesthesia Postprocedure Evaluation (Signed)
Anesthesia Post Note  Patient: Allen Miller  Procedure(s) Performed: Procedure(s) (LRB): BASILIC VEIN TRANSPOSITION VERSUS ARTERIOVENOUS GRAFT INSERTION (Left) INSERTION OF DIALYSIS CATHETER (N/A)  Patient location during evaluation: PACU Anesthesia Type: General Level of consciousness: awake and alert Pain management: pain level controlled Vital Signs Assessment: post-procedure vital signs reviewed and stable Respiratory status: spontaneous breathing, nonlabored ventilation, respiratory function stable and patient connected to nasal cannula oxygen Cardiovascular status: blood pressure returned to baseline and stable Postop Assessment: no signs of nausea or vomiting Anesthetic complications: no    Last Vitals:  Filed Vitals:   02/24/16 1424 02/24/16 1430  BP: 144/78   Pulse: 72 72  Temp: 36.6 C   Resp: 12 17    Last Pain: There were no vitals filed for this visit.               Manaia Samad S

## 2016-02-24 NOTE — Anesthesia Procedure Notes (Signed)
Procedure Name: LMA Insertion Date/Time: 02/24/2016 12:34 PM Performed by: Mariea Clonts Pre-anesthesia Checklist: Patient identified, Timeout performed, Emergency Drugs available, Suction available and Patient being monitored Patient Re-evaluated:Patient Re-evaluated prior to inductionOxygen Delivery Method: Circle system utilized Preoxygenation: Pre-oxygenation with 100% oxygen Intubation Type: IV induction LMA: LMA inserted LMA Size: 4.0 Number of attempts: 1 Placement Confirmation: breath sounds checked- equal and bilateral and positive ETCO2 Tube secured with: Tape Dental Injury: Teeth and Oropharynx as per pre-operative assessment

## 2016-02-24 NOTE — Interval H&P Note (Signed)
History and Physical Interval Note:  02/24/2016 10:34 AM  [name]  has presented today for surgery, with the diagnosis of Stage V Chronic Kidney Disease N18.5  The various methods of treatment have been discussed with the patient and family. After consideration of risks, benefits and other options for treatment, the patient has consented to  Procedure(s): BASILIC VEIN TRANSPOSITION VERSUS ARTERIOVENOUS GRAFT INSERTION (Left) INSERTION OF DIALYSIS CATHETER (N/A) as a surgical intervention .  The patient's history has been reviewed, patient examined, no change in status, stable for surgery.  I have reviewed the patient's chart and labs.  Questions were answered to the patient's satisfaction.     Curt Jews

## 2016-02-24 NOTE — Anesthesia Preprocedure Evaluation (Addendum)
Anesthesia Evaluation  Patient identified by MRN, date of birth, ID band Patient awake    Reviewed: Allergy & Precautions, NPO status , Patient's Chart, lab work & pertinent test results  Airway Mallampati: II  TM Distance: >3 FB Neck ROM: Full    Dental no notable dental hx.    Pulmonary neg pulmonary ROS,    Pulmonary exam normal breath sounds clear to auscultation       Cardiovascular hypertension, Pt. on medications Normal cardiovascular exam Rhythm:Regular Rate:Normal     Neuro/Psych CVA negative psych ROS   GI/Hepatic negative GI ROS, Neg liver ROS,   Endo/Other  negative endocrine ROS  Renal/GU ESRFRenal disease  negative genitourinary   Musculoskeletal negative musculoskeletal ROS (+)   Abdominal   Peds negative pediatric ROS (+)  Hematology  (+) anemia ,   Anesthesia Other Findings   Reproductive/Obstetrics negative OB ROS                           Anesthesia Physical Anesthesia Plan  ASA: III  Anesthesia Plan: General   Post-op Pain Management:    Induction: Intravenous  Airway Management Planned: LMA  Additional Equipment:   Intra-op Plan:   Post-operative Plan: Extubation in OR  Informed Consent: I have reviewed the patients History and Physical, chart, labs and discussed the procedure including the risks, benefits and alternatives for the proposed anesthesia with the patient or authorized representative who has indicated his/her understanding and acceptance.   Dental advisory given  Plan Discussed with: CRNA and Surgeon  Anesthesia Plan Comments:         Anesthesia Quick Evaluation

## 2016-02-24 NOTE — H&P (View-Only) (Signed)
Patient name: Allen Miller MRN: [id number] DOB: Mar 29, 1956 Sex: male   Referred by: Dr. Moshe Cipro  Reason for referral:  Chief Complaint  Patient presents with  . Follow-up    HISTORY OF PRESENT ILLNESS: This is a 60 year old right-handed gentleman who is referred today for evaluation of permanent dialysis access.  His renal failure secondary to diabetes.  His most recent creatinine clearance was less than 10.  The patient has a history of hypertension.  He also has a history of stroke.  He is on a statin for hypercholesterolemia.  He is a nonsmoker and a nondrinker.   Past Medical History  Diagnosis Date  . Hypertension   . Renal disorder     No past surgical history on file.  Social History   Social History  . Marital Status: Single    Spouse Name: N/A  . Number of Children: N/A  . Years of Education: N/A   Occupational History  . Not on file.   Social History Main Topics  . Smoking status: Never Smoker   . Smokeless tobacco: Not on file  . Alcohol Use: Not on file  . Drug Use: Not on file  . Sexual Activity: Not on file   Other Topics Concern  . Not on file   Social History Narrative    No family history on file.  Allergies as of 02/21/2016  . (No Known Allergies)    Current Outpatient Prescriptions on File Prior to Visit  Medication Sig Dispense Refill  . amLODipine (NORVASC) 10 MG tablet Take 1 tablet (10 mg total) by mouth daily. 30 tablet 0  . aspirin 81 MG chewable tablet Chew 1 tablet (81 mg total) by mouth daily. 30 tablet 5  . atorvastatin (LIPITOR) 40 MG tablet Take 1 tablet (40 mg total) by mouth daily at 6 PM. 30 tablet 0  . carvedilol (COREG) 12.5 MG tablet Take 1 tablet (12.5 mg total) by mouth 2 (two) times daily with a meal. 60 tablet 0  . clopidogrel (PLAVIX) 75 MG tablet Take 1 tablet (75 mg total) by mouth daily. 30 tablet 3  . furosemide (LASIX) 80 MG tablet Take 1 tablet (80 mg total) by mouth 2 (two) times daily. 60  tablet 0  . hydrALAZINE (APRESOLINE) 100 MG tablet Take 0.5 tablets (50 mg total) by mouth 3 (three) times daily. 90 tablet 0  . polyethylene glycol powder (MIRALAX) powder Take 17 g by mouth daily. Twice a day x 2 days, then daily, followed by daily PRN once regular bowel movements 255 g 0   No current facility-administered medications on file prior to visit.     REVIEW OF SYSTEMS: Cardiovascular: No chest pain, chest pressure, palpitations, orthopnea, or dyspnea on exertion. No claudication or rest pain,  Pulmonary: No productive cough, asthma or wheezing. Neurologic: No weakness, paresthesias, aphasia, or amaurosis. No dizziness. Hematologic: No bleeding problems or clotting disorders. Musculoskeletal: No joint pain or joint swelling. Gastrointestinal: No blood in stool or hematemesis Genitourinary: No dysuria or hematuria. Psychiatric:: No history of major depression. Integumentary: No rashes or ulcers. Constitutional: No fever or chills.  PHYSICAL EXAMINATION:  Filed Vitals:   02/21/16 1611  BP: 144/75  Pulse: 70  Temp: 97.4 F (36.3 C)  TempSrc: Oral  Resp: 14  Height: 5\' 4"  (1.626 m)  Weight: 202 lb 8 oz (91.853 kg)  SpO2: 98%   Body mass index is 34.74 kg/(m^2). General: The patient appears their stated age.   HEENT:  No  gross abnormalities Pulmonary: Respirations are non-labored Musculoskeletal: There are no major deformities.   Neurologic: No focal weakness or paresthesias are detected, Skin: There are no ulcer or rashes noted. Psychiatric: The patient has normal affect. Cardiovascular: There is a regular rate and rhythm without significant murmur appreciated.Palpable radial and brachial pulses bilaterally  Diagnostic Studies: I have reviewed his vein mapping.  He has a marginal left basilic vein measuring 0.2 at the antecubital crease but goes up to 0.3 to the upper arm.  Arterial waveforms are normal.    Assessment:  Chronic renal insufficiency, stage  V Plan: The patient needs dialysis access in the immediate future.  Nephrology is requesting that we place permanent access in the extremity as well as a tunneled catheter.  After reviewing the patient's vein mapping, I discussed with him that he may require a graft.  I discussed 3 surgical options with him #1 would be a first stage left basilic vein transposition, understanding that if the vein matured appropriately he would need a second stage.  #2 would be a left sided dialysis graft either in his forearm or his upper arm.  #3 would be left basilic vein transposition done in one setting.  He would also require a tunneled dialysis catheter, most likely on the right side.  I discussed the risks and benefits of the operation with the patient and his son.  He understands risk of non-maturity the need for future interventions and the risk of steal syndrome, as well as risk for bleeding and cardiac complications.  The patient is on Plavix.  I'm stopping that today so that he can have his procedure on Monday as I am not available to do this currently one of my partners will take care of him.  The patient is in agreement with this.     Eldridge Abrahams, M.D. [facility] Office: [phone] Pager:  [phone]

## 2016-02-25 ENCOUNTER — Encounter (HOSPITAL_COMMUNITY): Payer: Self-pay | Admitting: Vascular Surgery

## 2016-02-25 ENCOUNTER — Ambulatory Visit: Payer: PRIVATE HEALTH INSURANCE | Admitting: Diagnostic Neuroimaging

## 2016-02-26 ENCOUNTER — Encounter: Payer: Self-pay | Admitting: Diagnostic Neuroimaging

## 2016-02-28 ENCOUNTER — Telehealth: Payer: Self-pay | Admitting: Vascular Surgery

## 2016-02-28 NOTE — Telephone Encounter (Addendum)
-----   Message from Mena Goes, RN sent at 02/24/2016  4:41 PM EDT ----- Regarding: schedule   ----- Message -----    From: Gabriel Earing, PA-C    Sent: 02/24/2016   2:48 PM      To: [facility] Charge Pool  S/p left RC AVF and TDC placement.  F/u with Dr. Donnetta Hutching in 4 weeks with duplex.  Thanks, Aldona Bar  S/p left RC AVF and TDC placement. F/u with Dr. Donnetta Hutching in 4 weeks with duplex. notified patient of post op appts.

## 2016-03-10 MED FILL — hydrALAZINE HCL 100 MG TABS: 100 | 30 days supply | Qty: 45 | Fill #0

## 2016-03-10 MED FILL — CLOPIDOGREL 75 MG TABLET: 75 | 30 days supply | Qty: 30 | Fill #1

## 2016-04-13 ENCOUNTER — Telehealth: Payer: Self-pay | Admitting: *Deleted

## 2016-04-13 NOTE — Telephone Encounter (Signed)
Mr. Schulenberg called to let us know that the [facility] has set him up for removal of his right IJ by a Dr. At [facility] tomorrow afternoon for infection. He will come in to have his scheduled vascular dialysis duplex on Wednesday 04-15-16. According to Levada Dy at [facility], they have not stuck his Right Radiocephalic AVF that was placed on 02-24-16 by Dr. Donnetta Hutching. They are aware of his appts with [place] for postop. They have already set patient up to have another Catheter placed on Thursday at [facility]. Next appt with Dr. Donnetta Hutching is on 04-28-16 for first postop.

## 2016-04-15 ENCOUNTER — Ambulatory Visit (HOSPITAL_COMMUNITY)
Admission: RE | Admit: 2016-04-15 | Discharge: 2016-04-15 | Disposition: A | Discharge status: Home / Self Care | Payer: BLUE CROSS/BLUE SHIELD | Source: Ambulatory Visit | Attending: Vascular Surgery | Admitting: Vascular Surgery

## 2016-04-15 DIAGNOSIS — N186 End stage renal disease: Secondary | ICD-10-CM | POA: Diagnosis not present

## 2016-04-15 DIAGNOSIS — Z4931 Encounter for adequacy testing for hemodialysis: Secondary | ICD-10-CM

## 2016-04-16 ENCOUNTER — Encounter (HOSPITAL_COMMUNITY): Payer: Self-pay | Admitting: Emergency Medicine

## 2016-04-16 ENCOUNTER — Emergency Department (HOSPITAL_COMMUNITY)
Admission: EM | Admit: 2016-04-16 | Discharge: 2016-04-16 | Disposition: A | Discharge status: Home / Self Care | Payer: BLUE CROSS/BLUE SHIELD | Attending: Emergency Medicine | Admitting: Emergency Medicine

## 2016-04-16 DIAGNOSIS — Z79899 Other long term (current) drug therapy: Secondary | ICD-10-CM | POA: Insufficient documentation

## 2016-04-16 DIAGNOSIS — T8241XA Breakdown (mechanical) of vascular dialysis catheter, initial encounter: Secondary | ICD-10-CM

## 2016-04-16 DIAGNOSIS — R42 Dizziness and giddiness: Secondary | ICD-10-CM | POA: Insufficient documentation

## 2016-04-16 DIAGNOSIS — I120 Hypertensive chronic kidney disease with stage 5 chronic kidney disease or end stage renal disease: Secondary | ICD-10-CM | POA: Diagnosis not present

## 2016-04-16 DIAGNOSIS — Y658 Other specified misadventures during surgical and medical care: Secondary | ICD-10-CM | POA: Diagnosis not present

## 2016-04-16 DIAGNOSIS — N186 End stage renal disease: Secondary | ICD-10-CM | POA: Diagnosis not present

## 2016-04-16 DIAGNOSIS — R531 Weakness: Secondary | ICD-10-CM | POA: Insufficient documentation

## 2016-04-16 DIAGNOSIS — Z992 Dependence on renal dialysis: Secondary | ICD-10-CM | POA: Insufficient documentation

## 2016-04-16 DIAGNOSIS — Z8673 Personal history of transient ischemic attack (TIA), and cerebral infarction without residual deficits: Secondary | ICD-10-CM | POA: Diagnosis not present

## 2016-04-16 DIAGNOSIS — Z7982 Long term (current) use of aspirin: Secondary | ICD-10-CM | POA: Insufficient documentation

## 2016-04-16 LAB — CBC WITH DIFFERENTIAL/PLATELET
BASOS ABS: 0 10*3/uL (ref 0.0–0.1)
Basophils Relative: 1 %
EOS PCT: 6 %
Eosinophils Absolute: 0.3 10*3/uL (ref 0.0–0.7)
HEMATOCRIT: 31.3 % — AB (ref 39.0–52.0)
Hemoglobin: 9.8 g/dL — ABNORMAL LOW (ref 13.0–17.0)
LYMPHS ABS: 1 10*3/uL (ref 0.7–4.0)
Lymphocytes Relative: 19 %
MCH: 25.4 pg — AB (ref 26.0–34.0)
MCHC: 31.3 g/dL (ref 30.0–36.0)
MCV: 81.1 fL (ref 78.0–100.0)
MONO ABS: 0.4 10*3/uL (ref 0.1–1.0)
Monocytes Relative: 8 %
NEUTROS ABS: 3.5 10*3/uL (ref 1.7–7.7)
Neutrophils Relative %: 66 %
Platelets: 262 10*3/uL (ref 150–400)
RBC: 3.86 MIL/uL — AB (ref 4.22–5.81)
RDW: 16.8 % — ABNORMAL HIGH (ref 11.5–15.5)
WBC: 5.3 10*3/uL (ref 4.0–10.5)

## 2016-04-16 LAB — BASIC METABOLIC PANEL
ANION GAP: 11 (ref 5–15)
BUN: 23 mg/dL — AB (ref 6–20)
CO2: 27 mmol/L (ref 22–32)
Calcium: 7.9 mg/dL — ABNORMAL LOW (ref 8.9–10.3)
Chloride: 97 mmol/L — ABNORMAL LOW (ref 101–111)
Creatinine, Ser: 4.05 mg/dL — ABNORMAL HIGH (ref 0.61–1.24)
GFR calc Af Amer: 17 mL/min — ABNORMAL LOW (ref >60)
GFR, EST NON AFRICAN AMERICAN: 15 mL/min — AB (ref >60)
GLUCOSE: 278 mg/dL — AB (ref 65–99)
POTASSIUM: 4 mmol/L (ref 3.5–5.1)
Sodium: 135 mmol/L (ref 135–145)

## 2016-04-16 NOTE — ED Provider Notes (Signed)
CSN: [id number]     Arrival date & time 04/16/16  2000 History   First MD Initiated Contact with Patient 04/16/16 2027     Chief Complaint  Patient presents with  . Vascular Access Problem     (Consider location/radiation/quality/duration/timing/severity/associated sxs/prior Treatment) HPI Comments: Patient with a history of ESRD currently on hemodialysis presents today with a chief complaint of bleeding from the site of the dialysis catheter in the right upper chest.  He reports that he just noticed the bleeding this evening.  Bleeding has resolved at this time without intervention.  He reports that he just had the catheter replaced by vascular this morning.  He had his previous catheter removed due to an infection.  He reports that he did complete his dialysis this morning.  He is also reporting associated dizziness and generalized weakness at this time.  He denies chest pain and SOB.  Denies nausea, vomiting, or abdominal pain.  Denies headache, numbness, tingling, difficulty speaking, or difficulty swallowing.    The history is provided by the patient.    Past Medical History  Diagnosis Date  . Hypertension   . Renal disorder   . Shortness of breath dyspnea     when lying flat  . Stroke [facility])     effective vision   Past Surgical History  Procedure Laterality Date  . Bascilic vein transposition Left 02/24/2016    Procedure: BASILIC VEIN TRANSPOSITION VERSUS ARTERIOVENOUS GRAFT INSERTION;  Surgeon: [name], MD;  Location: [place];  Service: Vascular;  Laterality: Left;  . Insertion of dialysis catheter N/A 02/24/2016    Procedure: INSERTION OF DIALYSIS CATHETER;  Surgeon: [name], MD;  Location: [facility] OR;  Service: Vascular;  Laterality: N/A;   No family history on file. Social History  Substance Use Topics  . Smoking status: Never Smoker   . Smokeless tobacco: None  . Alcohol Use: No    Review of Systems  All other systems reviewed and are negative.     Allergies   Review of patient's allergies indicates no known allergies.  Home Medications   Prior to Admission medications   Medication Sig Start Date End Date Taking? Authorizing Provider  amLODipine (NORVASC) 10 MG tablet Take 1 tablet (10 mg total) by mouth daily. 01/26/16   Thurnell Lose, MD  aspirin 81 MG chewable tablet Chew 1 tablet (81 mg total) by mouth daily. 01/24/16   Thurnell Lose, MD  atorvastatin (LIPITOR) 40 MG tablet Take 1 tablet (40 mg total) by mouth daily at 6 PM. 01/24/16   Thurnell Lose, MD  carvedilol (COREG) 12.5 MG tablet Take 1 tablet (12.5 mg total) by mouth 2 (two) times daily with a meal. 01/24/16   Thurnell Lose, MD  clopidogrel (PLAVIX) 75 MG tablet Take 1 tablet (75 mg total) by mouth daily. 01/24/16   Thurnell Lose, MD  furosemide (LASIX) 80 MG tablet Take 1 tablet (80 mg total) by mouth 2 (two) times daily. 01/24/16   Thurnell Lose, MD  hydrALAZINE (APRESOLINE) 100 MG tablet Take 0.5 tablets (50 mg total) by mouth 3 (three) times daily. 01/29/16   Lisabeth Pick, MD  losartan-hydrochlorothiazide (HYZAAR) 100-25 MG tablet TAKE 1 TAB BY MOUTH ONCE A DAY. 01/09/16   Historical Provider, MD  oxyCODONE (ROXICODONE) 5 MG immediate release tablet Take 1 tablet (5 mg total) by mouth every 6 (six) hours as needed. 02/24/16   Samantha J Rhyne, PA-C  polyethylene glycol powder (MIRALAX) powder  Take 17 g by mouth daily. Twice a day x 2 days, then daily, followed by daily PRN once regular bowel movements 02/08/16   Jola Schmidt, MD   BP 168/76 mmHg  Pulse 97  Temp(Src) 98.9 F (37.2 C) (Oral)  Resp 20  SpO2 99% Physical Exam  Constitutional: He appears well-developed and well-nourished.  HENT:  Head: Normocephalic and atraumatic.  Mouth/Throat: Oropharynx is clear and moist.  Eyes: EOM are normal. Pupils are equal, round, and reactive to light.  Neck: Normal range of motion. Neck supple.  Cardiovascular: Normal rate, regular rhythm and normal heart sounds.    Pulmonary/Chest: Effort normal and breath sounds normal.  Right chest dialysis catheter with small amount of blood on the overlying guaze.  No active bleeding.  No erythema, edema, or warmth surrounding the dialysis catheter.  Abdominal: Soft. Bowel sounds are normal. There is no tenderness.  Musculoskeletal: Normal range of motion.  Neurological: He is alert. He has normal strength. No cranial nerve deficit or sensory deficit. Gait normal.  Skin: Skin is warm and dry.  Psychiatric: He has a normal mood and affect.  Nursing note and vitals reviewed.   ED Course  Procedures (including critical care time) Labs Review Labs Reviewed  CBC WITH DIFFERENTIAL/PLATELET  BASIC METABOLIC PANEL    Imaging Review No results found. I have personally reviewed and evaluated these images and lab results as part of my medical decision-making.   EKG Interpretation None      MDM   Final diagnoses:  None   Patient presents today with bleeding from the site of his hemodialysis catheter in his right upper chest.  Bleeding had completely resolved by the time of my evaluation.  No signs of infection.  Patient also complaining of dizziness and generalized weakness.  Labs unremarkable.  Normal neurological exam.  Patient stable for discharge.  Return precautions given.    Hyman Bible, PA-C 04/18/16 [id number]  Virgel Manifold, MD 04/23/16 [phone]

## 2016-04-16 NOTE — ED Notes (Signed)
Pt verbalized understanding of d/c instructions and has no further questions. Pt stable and NAD.  

## 2016-04-16 NOTE — ED Notes (Signed)
Pt. noticed  bleeding at hemodialysis catheter dressing this evening , no heavy bleeding at arrival , hemodialysis q Mon/Wed/ Fri . Pt. stated catheter was inserted ( right upper chest ) today .

## 2016-04-16 NOTE — Discharge Instructions (Signed)
Dialysis Vascular Access Malfunction °A vascular access is an entrance to your blood vessels that can be used for dialysis. A vascular access can be made in one of several ways:  °· Joining an artery to a vein under your skin to make a bigger blood vessel called a fistula.   °· Joining an artery to a vein under your skin using a soft tube called a graft.   °· Placing a thin, flexible tube (catheter) in a large vein, usually in your neck.   °A vascular access may malfunction or become blocked.  °WHAT CAN CAUSE YOUR VASCULAR ACCESS TO MALFUNCTION? °· Infection (common).   °· A blood clot inside a part of the fistula, graft, or catheter. A blood clot can completely or partially block the flow of blood.   °· A kink in the graft or catheter.   °· A collection of blood (called a hematoma or bruise) next to the graft or catheter that pushes against it, blocking the flow of blood.   °WHAT ARE SIGNS AND SYMPTOMS OF VASCULAR ACCESS MALFUNCTION? °· There is a change in the vibration or pulse of your fistula or graft. °· The vibration or pulse of your fistula or graft is gone.   °· There is new or unusual swelling of the area around the access.   °· There was an unsuccessful puncture of your access by the dialysis team.   °· The flow of blood through the fistula, graft, or catheter is too slow for effective dialysis.   °· When routine dialysis is completed and the needle is removed, bleeding lasts for too long a time.   °WHAT HAPPENS IF MY VASCULAR ACCESS MALFUNCTIONS? °Your health care provider may order blood work, cultures, or an X-ray test in order to learn what may be wrong with your vascular access. The X-ray test involves the injection of a liquid into the vascular access. The liquid shows up on the X-ray and allows your health care provider to see if there is a blockage in the vascular access.  °Treatment varies depending on the cause of the malfunction:   °· If the vascular access is infected, your health care provider  may prescribe antibiotic medicine to control the infection.   °· If a clot is found in the vascular access, you may need surgery to remove the clot.   °· If a blockage in the vascular access is due to some other cause (such as a kink in a graft), then you will likely need surgery to unblock or replace the graft.   °HOME CARE INSTRUCTIONS: °Follow up with your surgeon or other health care provider if you were instructed to do so. This is very important. Any delay in follow-up could cause permanent dysfunction of the vascular access, which may be dangerous.  °SEEK MEDICAL CARE IF:  °· Fever develops.   °· Swelling and pain around the vascular access gets worse or new pain develops. °· Pain, numbness, or an unusual pale skin color develops in the hand on the side of your vascular access. °SEEK IMMEDIATE MEDICAL CARE IF: °Unusual bleeding develops at the location of the vascular access. °MAKE SURE YOU: °· Understand these instructions. °· Will watch your condition. °· Will get help right away if you are not doing well or get worse. °  °This information is not intended to replace advice given to you by your health care provider. Make sure you discuss any questions you have with your health care provider. °  °Document Released: 10/19/2006 Document Revised: 12/07/2014 Document Reviewed: 04/20/2013 °Elsevier Interactive Patient Education ©2016 [employer] ° °

## 2016-04-22 ENCOUNTER — Encounter: Payer: Self-pay | Admitting: Vascular Surgery

## 2016-04-28 ENCOUNTER — Ambulatory Visit: Payer: BLUE CROSS/BLUE SHIELD | Admitting: Vascular Surgery

## 2016-05-07 ENCOUNTER — Encounter: Payer: Self-pay | Admitting: Vascular Surgery

## 2016-05-12 ENCOUNTER — Ambulatory Visit (INDEPENDENT_AMBULATORY_CARE_PROVIDER_SITE_OTHER): Payer: Self-pay | Admitting: Vascular Surgery

## 2016-05-12 ENCOUNTER — Encounter: Payer: Self-pay | Admitting: Vascular Surgery

## 2016-05-12 VITALS — BP 190/98 | HR 83 | Ht 64.0 in | Wt 175.6 lb

## 2016-05-12 DIAGNOSIS — Z992 Dependence on renal dialysis: Secondary | ICD-10-CM

## 2016-05-12 DIAGNOSIS — N186 End stage renal disease: Secondary | ICD-10-CM

## 2016-05-12 NOTE — Progress Notes (Signed)
  POST OPERATIVE OFFICE NOTE    CC:  F/u for surgery  HPI:  This is a 60 y.o. male who is s/p Left radial cephalic fistula creation by Dr. Donnetta Hutching.  He is currently on HD via right IJ tunneled catheter.  He reports no numbness, weakness or pain in his left hand.    No Known Allergies  Current Outpatient Prescriptions  Medication Sig Dispense Refill  . amLODipine (NORVASC) 10 MG tablet Take 1 tablet (10 mg total) by mouth daily. 30 tablet 0  . aspirin 81 MG chewable tablet Chew 1 tablet (81 mg total) by mouth daily. 30 tablet 5  . atorvastatin (LIPITOR) 40 MG tablet Take 1 tablet (40 mg total) by mouth daily at 6 PM. 30 tablet 0  . calcium acetate (PHOSLO) 667 MG capsule Take 667 mg by mouth 3 (three) times daily.  6  . carvedilol (COREG) 12.5 MG tablet Take 1 tablet (12.5 mg total) by mouth 2 (two) times daily with a meal. 60 tablet 0  . clopidogrel (PLAVIX) 75 MG tablet Take 1 tablet (75 mg total) by mouth daily. 30 tablet 3  . furosemide (LASIX) 80 MG tablet Take 1 tablet (80 mg total) by mouth 2 (two) times daily. 60 tablet 0  . hydrALAZINE (APRESOLINE) 100 MG tablet Take 0.5 tablets (50 mg total) by mouth 3 (three) times daily. 90 tablet 0  . losartan-hydrochlorothiazide (HYZAAR) 100-25 MG tablet TAKE 1 TAB BY MOUTH ONCE A DAY.  2  . oxyCODONE (ROXICODONE) 5 MG immediate release tablet Take 1 tablet (5 mg total) by mouth every 6 (six) hours as needed. 20 tablet 0  . polyethylene glycol powder (MIRALAX) powder Take 17 g by mouth daily. Twice a day x 2 days, then daily, followed by daily PRN once regular bowel movements 255 g 0   No current facility-administered medications for this visit.     ROS:  See HPI  Physical Exam:  Filed Vitals:   05/12/16 1440 05/12/16 1501  BP: 195/99 190/98  Pulse: 83     Incision:  Well healed incision Extremities:  Sensation intact left hand, palpable thrill in AV fistula through out, no weakness grip 5/5.   Assessment/Plan:  This is a 60 y.o.  male who is s/p: Left radial cephalic av fistula creations.  You may use the av fistula for HD as of 05/26/2016.    If he has any concerns of trouble he can f/u otherwise f/u PRN.   Theda Sers, EMMA MAUREEN PA-C [facility]   Clinic MD:  Pt seen and examined with Dr. Donnetta Hutching  I have examined the patient, reviewed and agree with above.  Curt Jews, MD 05/12/2016 5:20 PM

## 2016-06-04 ENCOUNTER — Ambulatory Visit: Payer: BLUE CROSS/BLUE SHIELD | Admitting: Internal Medicine

## 2016-07-01 ENCOUNTER — Encounter: Payer: Self-pay | Admitting: Vascular Surgery

## 2016-07-02 ENCOUNTER — Encounter: Payer: Self-pay | Admitting: Vascular Surgery

## 2016-07-02 ENCOUNTER — Ambulatory Visit (INDEPENDENT_AMBULATORY_CARE_PROVIDER_SITE_OTHER): Payer: Medicare Other | Admitting: Vascular Surgery

## 2016-07-02 ENCOUNTER — Other Ambulatory Visit: Payer: Self-pay

## 2016-07-02 VITALS — BP 143/88 | HR 83 | Temp 97.6°F | Resp 18 | Ht 63.0 in | Wt 175.0 lb

## 2016-07-02 DIAGNOSIS — Z992 Dependence on renal dialysis: Secondary | ICD-10-CM

## 2016-07-02 DIAGNOSIS — I639 Cerebral infarction, unspecified: Secondary | ICD-10-CM | POA: Diagnosis not present

## 2016-07-02 DIAGNOSIS — N186 End stage renal disease: Secondary | ICD-10-CM

## 2016-07-02 NOTE — Progress Notes (Signed)
[facility]  Patient name: Allen Miller MRN: [id number] DOB: 1956/04/16 Sex: male  REASON FOR VISIT: Discuss access for hemodialysis  HPI: Allen Miller is a 60 y.o. male known to me from placement of right IJ hemodialysis catheter and left radiocephalic AV fistula creation on 02/24/2016. He been seen in our office in June and was felt to be ready for access of his fistula at total of 3 months. He reports that this was used for several occasions and then failed and has been using his catheter. He dialyzes at the [facility]. He does not have any steal symptoms.  Past Medical History:  Diagnosis Date  . Hypertension   . Renal disorder   . Shortness of breath dyspnea    when lying flat  . Stroke [facility])    effective vision    History reviewed. No pertinent family history.  SOCIAL HISTORY: Social History  Substance Use Topics  . Smoking status: Never Smoker  . Smokeless tobacco: Never Used  . Alcohol use No    No Known Allergies  Current Outpatient Prescriptions  Medication Sig Dispense Refill  . amLODipine (NORVASC) 10 MG tablet Take 1 tablet (10 mg total) by mouth daily. 30 tablet 0  . aspirin 81 MG chewable tablet Chew 1 tablet (81 mg total) by mouth daily. 30 tablet 5  . atorvastatin (LIPITOR) 40 MG tablet Take 1 tablet (40 mg total) by mouth daily at 6 PM. 30 tablet 0  . calcium acetate (PHOSLO) 667 MG capsule Take 667 mg by mouth 3 (three) times daily.  6  . carvedilol (COREG) 12.5 MG tablet Take 1 tablet (12.5 mg total) by mouth 2 (two) times daily with a meal. 60 tablet 0  . clopidogrel (PLAVIX) 75 MG tablet Take 1 tablet (75 mg total) by mouth daily. 30 tablet 3  . furosemide (LASIX) 80 MG tablet Take 1 tablet (80 mg total) by mouth 2 (two) times daily. 60 tablet 0  . hydrALAZINE (APRESOLINE) 100 MG tablet Take 0.5 tablets (50 mg total) by mouth 3 (three) times daily. 90 tablet 0  . losartan-hydrochlorothiazide (HYZAAR) 100-25 MG  tablet TAKE 1 TAB BY MOUTH ONCE A DAY.  2  . oxyCODONE (ROXICODONE) 5 MG immediate release tablet Take 1 tablet (5 mg total) by mouth every 6 (six) hours as needed. 20 tablet 0  . polyethylene glycol powder (MIRALAX) powder Take 17 g by mouth daily. Twice a day x 2 days, then daily, followed by daily PRN once regular bowel movements 255 g 0   No current facility-administered medications for this visit.     REVIEW OF SYSTEMS:  [X]  denotes positive finding, [ ]  denotes negative finding Cardiac  Comments:  Chest pain or chest pressure:    Shortness of breath upon exertion:    Short of breath when lying flat:    Irregular heart rhythm:        Vascular    Pain in calf, thigh, or hip brought on by ambulation:    Pain in feet at night that wakes you up from your sleep:     Blood clot in your veins:    Leg swelling:         Pulmonary    Oxygen at home:    Productive cough:     Wheezing:         Neurologic    Sudden weakness in arms or legs:     Sudden numbness in arms or legs:  Sudden onset of difficulty speaking or slurred speech:    Temporary loss of vision in one eye:  x   Problems with dizziness:         Gastrointestinal    Blood in stool:     Vomited blood:         Genitourinary    Burning when urinating:     Blood in urine:        Psychiatric    Major depression:         Hematologic    Bleeding problems:    Problems with blood clotting too easily:        Skin    Rashes or ulcers:        Constitutional    Fever or chills:      PHYSICAL EXAM: Vitals:   07/02/16 1019  BP: (!) 143/88  Pulse: 83  Resp: 18  Temp: 97.6 F (36.4 C)  TempSrc: Oral  Weight: 175 lb (79.4 kg)  Height: 5\' 3"  (1.6 m)    GENERAL: The patient is a well-nourished male, in no acute distress. The vital signs are documented above. VASCULAR: 2+ radial pulses bilaterally reviewed no thrill in her left arm AV fistula PULMONARY: There is good air exchange ABDOMEN: Soft and non-tender  with normal pitched bowel sounds.  MUSCULOSKELETAL: There are no major deformities or cyanosis. NEUROLOGIC: No focal weakness or paresthesias are detected. SKIN: There are no ulcers or rashes noted. PSYCHIATRIC: The patient has a normal affect.  I look with SonoSite and he does have occlusion of his fistula. The left upper arm cephalic vein is small. He does have a moderate-sized basilic vein in the upper arm.  MEDICAL ISSUES: Discussed options with patient. I would recommend left upper arm basilic vein fistula versus AV graft depending on his vein size time of surgery. He currently dialyzes on Monday Wednesday and Friday. Will be scheduled for outpatient left arm access on a Tuesday or Thursday. Understands that I am in the office on these days and one of my partners will be doing the procedure.    Curt Jews [facility] [employer] [phone]

## 2016-07-13 MED ORDER — SODIUM CHLORIDE 0.9 % IV SOLN
INTRAVENOUS | Status: DC
  Administered 2016-07-14: 07:00:00 via INTRAVENOUS

## 2016-07-13 MED ORDER — CEFUROXIME SODIUM 1.5 G IJ SOLR
1.5000 g | INTRAMUSCULAR | Status: AC
  Administered 2016-07-14: 1.5 g via INTRAVENOUS
  Filled 2016-07-13: qty 1.5

## 2016-07-13 NOTE — Progress Notes (Signed)
I was unable to reach patient by phone.  I left  A message on voice mail.  I instructed the patient to arrive at [place] entrance at -Amlodipine, Coreg, Hydralazine.   , nothing to eat or drink after midnight.   I instructed the patient to take the following medications in the am with just enough water to get them down:  I asked patient to not wear any lotions, powders, cologne, jewelry, piercing, make-up or nail polish.  I asked the patient to call [phone], in the am if there were any questions or problems.  I instructed patient that he will need a driver and someone to stay with him for 24 hours after surgery.

## 2016-07-14 ENCOUNTER — Ambulatory Visit (HOSPITAL_COMMUNITY): Payer: Medicare Other | Admitting: Anesthesiology

## 2016-07-14 ENCOUNTER — Encounter (HOSPITAL_COMMUNITY)
Admission: RE | Disposition: A | Discharge status: Home / Self Care | Payer: Self-pay | Source: Ambulatory Visit | Attending: Surgery

## 2016-07-14 ENCOUNTER — Telehealth: Payer: Self-pay | Admitting: Vascular Surgery

## 2016-07-14 ENCOUNTER — Ambulatory Visit (HOSPITAL_COMMUNITY)
Admission: RE | Admit: 2016-07-14 | Discharge: 2016-07-14 | Disposition: A | Discharge status: Home / Self Care | Payer: Medicare Other | Source: Ambulatory Visit | Attending: Surgery | Admitting: Surgery

## 2016-07-14 DIAGNOSIS — N186 End stage renal disease: Secondary | ICD-10-CM | POA: Insufficient documentation

## 2016-07-14 DIAGNOSIS — Z7982 Long term (current) use of aspirin: Secondary | ICD-10-CM | POA: Insufficient documentation

## 2016-07-14 DIAGNOSIS — Z7902 Long term (current) use of antithrombotics/antiplatelets: Secondary | ICD-10-CM | POA: Insufficient documentation

## 2016-07-14 DIAGNOSIS — Z8673 Personal history of transient ischemic attack (TIA), and cerebral infarction without residual deficits: Secondary | ICD-10-CM | POA: Insufficient documentation

## 2016-07-14 DIAGNOSIS — I120 Hypertensive chronic kidney disease with stage 5 chronic kidney disease or end stage renal disease: Secondary | ICD-10-CM | POA: Diagnosis not present

## 2016-07-14 DIAGNOSIS — Z79899 Other long term (current) drug therapy: Secondary | ICD-10-CM | POA: Diagnosis not present

## 2016-07-14 DIAGNOSIS — N185 Chronic kidney disease, stage 5: Secondary | ICD-10-CM | POA: Diagnosis not present

## 2016-07-14 HISTORY — PX: BASCILIC VEIN TRANSPOSITION: SHX5742

## 2016-07-14 LAB — GLUCOSE, CAPILLARY: Glucose-Capillary: 89 mg/dL (ref 65–99)

## 2016-07-14 LAB — POCT I-STAT 4, (NA,K, GLUC, HGB,HCT)
Glucose, Bld: 95 mg/dL (ref 65–99)
HEMATOCRIT: 38 % — AB (ref 39.0–52.0)
Hemoglobin: 12.9 g/dL — ABNORMAL LOW (ref 13.0–17.0)
Potassium: 5.8 mmol/L — ABNORMAL HIGH (ref 3.5–5.1)
SODIUM: 138 mmol/L (ref 135–145)

## 2016-07-14 SURGERY — TRANSPOSITION, VEIN, BASILIC
Anesthesia: General | Site: Arm Upper | Laterality: Left

## 2016-07-14 MED ORDER — MIDAZOLAM HCL 2 MG/2ML IJ SOLN
INTRAMUSCULAR | Status: AC
  Filled 2016-07-14: qty 2

## 2016-07-14 MED ORDER — LIDOCAINE HCL (PF) 1 % IJ SOLN
INTRAMUSCULAR | Status: AC
  Filled 2016-07-14: qty 30

## 2016-07-14 MED ORDER — OXYCODONE-ACETAMINOPHEN 5-325 MG PO TABS: 1.0000 | ORAL_TABLET | Freq: Four times a day (QID) | ORAL | 0 refills | Status: DC | PRN

## 2016-07-14 MED ORDER — SUCCINYLCHOLINE CHLORIDE 200 MG/10ML IV SOSY
PREFILLED_SYRINGE | INTRAVENOUS | Status: AC
  Filled 2016-07-14: qty 10

## 2016-07-14 MED ORDER — PROPOFOL 10 MG/ML IV BOLUS
INTRAVENOUS | Status: AC
  Filled 2016-07-14: qty 20

## 2016-07-14 MED ORDER — ONDANSETRON HCL 4 MG/2ML IJ SOLN
INTRAMUSCULAR | Status: AC
  Filled 2016-07-14: qty 2

## 2016-07-14 MED ORDER — SODIUM POLYSTYRENE SULFONATE 15 GM/60ML PO SUSP
30.0000 g | Freq: Once | ORAL | Status: DC
  Filled 2016-07-14 (×2): qty 120

## 2016-07-14 MED ORDER — PHENYLEPHRINE HCL 10 MG/ML IJ SOLN
INTRAMUSCULAR | Status: DC | PRN
  Administered 2016-07-14 (×2): 120 ug via INTRAVENOUS

## 2016-07-14 MED ORDER — PHENYLEPHRINE HCL 10 MG/ML IJ SOLN
INTRAVENOUS | Status: DC | PRN
  Administered 2016-07-14: 40 ug/min via INTRAVENOUS

## 2016-07-14 MED ORDER — SODIUM CHLORIDE 0.9 % IV SOLN
INTRAVENOUS | Status: DC | PRN
  Administered 2016-07-14: 500 mL

## 2016-07-14 MED ORDER — LIDOCAINE HCL (PF) 1 % IJ SOLN
INTRAMUSCULAR | Status: DC | PRN
  Administered 2016-07-14: 3 mL

## 2016-07-14 MED ORDER — ONDANSETRON HCL 4 MG/2ML IJ SOLN
INTRAMUSCULAR | Status: DC | PRN
  Administered 2016-07-14: 4 mg via INTRAVENOUS

## 2016-07-14 MED ORDER — MIDAZOLAM HCL 5 MG/5ML IJ SOLN
INTRAMUSCULAR | Status: DC | PRN
  Administered 2016-07-14: 2 mg via INTRAVENOUS

## 2016-07-14 MED ORDER — 0.9 % SODIUM CHLORIDE (POUR BTL) OPTIME
TOPICAL | Status: DC | PRN
  Administered 2016-07-14: 1000 mL

## 2016-07-14 MED ORDER — CHLORHEXIDINE GLUCONATE CLOTH 2 % EX PADS: 6.0000 | MEDICATED_PAD | Freq: Once | CUTANEOUS | Status: DC

## 2016-07-14 MED ORDER — PROPOFOL 10 MG/ML IV BOLUS
INTRAVENOUS | Status: DC | PRN
  Administered 2016-07-14: 130 mg via INTRAVENOUS

## 2016-07-14 MED ORDER — LIDOCAINE HCL (CARDIAC) 20 MG/ML IV SOLN
INTRAVENOUS | Status: DC | PRN
Start: 2016-07-14 — End: 2016-07-14
  Administered 2016-07-14: 50 mg via INTRAVENOUS

## 2016-07-14 MED ORDER — FENTANYL CITRATE (PF) 250 MCG/5ML IJ SOLN
INTRAMUSCULAR | Status: AC
  Filled 2016-07-14: qty 5

## 2016-07-14 SURGICAL SUPPLY — 34 items
ARMBAND PINK RESTRICT EXTREMIT (MISCELLANEOUS) ×3 IMPLANT
CANISTER SUCTION 2500CC (MISCELLANEOUS) ×3 IMPLANT
CLIP TI MEDIUM 24 (CLIP) ×3 IMPLANT
CLIP TI WIDE RED SMALL 24 (CLIP) ×3 IMPLANT
CORDS BIPOLAR (ELECTRODE) IMPLANT
COVER PROBE W GEL 5X96 (DRAPES) IMPLANT
DECANTER SPIKE VIAL GLASS SM (MISCELLANEOUS) ×3 IMPLANT
ELECT REM PT RETURN 9FT ADLT (ELECTROSURGICAL) ×3
ELECTRODE REM PT RTRN 9FT ADLT (ELECTROSURGICAL) ×1 IMPLANT
GLOVE BIO SURGEON STRL SZ7 (GLOVE) ×3 IMPLANT
GLOVE BIOGEL PI IND STRL 7.0 (GLOVE) ×1 IMPLANT
GLOVE BIOGEL PI IND STRL 7.5 (GLOVE) ×2 IMPLANT
GLOVE BIOGEL PI INDICATOR 7.0 (GLOVE) ×2
GLOVE BIOGEL PI INDICATOR 7.5 (GLOVE) ×4
GLOVE ECLIPSE 7.0 STRL STRAW (GLOVE) ×3 IMPLANT
GOWN STRL REUS W/ TWL LRG LVL3 (GOWN DISPOSABLE) ×3 IMPLANT
GOWN STRL REUS W/TWL LRG LVL3 (GOWN DISPOSABLE) ×6
HEMOSTAT SPONGE AVITENE ULTRA (HEMOSTASIS) IMPLANT
KIT BASIN OR (CUSTOM PROCEDURE TRAY) ×3 IMPLANT
KIT ROOM TURNOVER OR (KITS) ×3 IMPLANT
LIQUID BAND (GAUZE/BANDAGES/DRESSINGS) ×3 IMPLANT
NS IRRIG 1000ML POUR BTL (IV SOLUTION) ×3 IMPLANT
PACK CV ACCESS (CUSTOM PROCEDURE TRAY) ×3 IMPLANT
PAD ARMBOARD 7.5X6 YLW CONV (MISCELLANEOUS) ×6 IMPLANT
SUT MNCRL AB 4-0 PS2 18 (SUTURE) ×3 IMPLANT
SUT PROLENE 6 0 BV (SUTURE) ×3 IMPLANT
SUT PROLENE 7 0 BV 1 (SUTURE) ×3 IMPLANT
SUT SILK 2 0 SH (SUTURE) ×3 IMPLANT
SUT VIC AB 2-0 CT1 27 (SUTURE) ×2
SUT VIC AB 2-0 CT1 TAPERPNT 27 (SUTURE) ×1 IMPLANT
SUT VIC AB 3-0 SH 27 (SUTURE) ×4
SUT VIC AB 3-0 SH 27X BRD (SUTURE) ×2 IMPLANT
UNDERPAD 30X30 INCONTINENT (UNDERPADS AND DIAPERS) ×3 IMPLANT
WATER STERILE IRR 1000ML POUR (IV SOLUTION) ×3 IMPLANT

## 2016-07-14 NOTE — Progress Notes (Signed)
Pt. Arrived late from over sleeping, did not take any meds this a.m. ., call to Dr. Marcie Bal, no need to issure the betablocker at this point.

## 2016-07-14 NOTE — Transfer of Care (Signed)
Immediate Anesthesia Transfer of Care Note  Patient: Allen Rung  Procedure(s) Performed: Procedure(s): LEFT BASILIC VEIN TRANSPOSITION (Left)  Patient Location: PACU  Anesthesia Type:General  Level of Consciousness: awake, alert , oriented and patient cooperative  Airway & Oxygen Therapy: Patient Spontanous Breathing and Patient connected to nasal cannula oxygen  Post-op Assessment: Report given to RN, Post -op Vital signs reviewed and stable and Patient moving all extremities X 4  Post vital signs: Reviewed and stable  Last Vitals:  Vitals:   07/14/16 0714 07/14/16 0930  BP: (!) 163/84 (!) 148/79  Pulse: 79 73  Resp: 18   Temp: 36.9 C 36.3 C    Last Pain:  Vitals:   07/14/16 0714  TempSrc: Oral         Complications: No apparent anesthesia complications

## 2016-07-14 NOTE — Telephone Encounter (Signed)
-----   Message from Mena Goes, RN sent at 07/14/2016  9:57 AM EDT ----- Regarding: schedule 4-5 weeks   ----- Message ----- From: Ulyses Amor, PA-C Sent: 07/14/2016   9:35 AM To: Vvs Charge Pool  F/U with Dr. Bridgett Larsson in 4-5 weeks no study needed s/p left basilic fistula first stage.

## 2016-07-14 NOTE — Interval H&P Note (Signed)
[facility] [place]  History and Physical Update  The patient was interviewed and re-examined.  The patient's previous History and Physical has been reviewed and is unchanged from Dr. Luther Parody consult.  There is no change in the plan of care: L arm AVF vs AVG placement.   Risk, benefits, and alternatives to access surgery were discussed.    The patient is aware the risks include but are not limited to: bleeding, infection, steal syndrome, nerve damage, ischemic monomelic neuropathy, failure to mature, need for additional procedures, death and stroke.    The patient agrees to proceed forward with the procedure.  Adele Barthel, MD [facility] [place] Office: [phone] Pager: [phone]  07/14/2016, 7:15 AM

## 2016-07-14 NOTE — Anesthesia Procedure Notes (Signed)
Procedure Name: LMA Insertion Date/Time: 07/14/2016 7:50 AM Performed by: Carney Living Pre-anesthesia Checklist: Patient identified, Emergency Drugs available, Suction available, Patient being monitored and Timeout performed Patient Re-evaluated:Patient Re-evaluated prior to inductionOxygen Delivery Method: Circle system utilized Preoxygenation: Pre-oxygenation with 100% oxygen Intubation Type: IV induction LMA: LMA inserted LMA Size: 4.0 Number of attempts: 1 Placement Confirmation: positive ETCO2 and breath sounds checked- equal and bilateral Tube secured with: Tape Dental Injury: Teeth and Oropharynx as per pre-operative assessment

## 2016-07-14 NOTE — Anesthesia Preprocedure Evaluation (Addendum)
Anesthesia Evaluation  Patient identified by MRN, date of birth, ID band Patient awake    Reviewed: Allergy & Precautions, H&P , NPO status , Patient's Chart, lab work & pertinent test results  Airway Mallampati: II  TM Distance: >3 FB Neck ROM: full    Dental  (+) Teeth Intact, Dental Advisory Given   Pulmonary shortness of breath,    breath sounds clear to auscultation       Cardiovascular hypertension, Pt. on medications and Pt. on home beta blockers  Rhythm:regular Rate:Normal     Neuro/Psych CVA, Residual Symptoms    GI/Hepatic   Endo/Other    Renal/GU ESRF and DialysisRenal disease     Musculoskeletal   Abdominal   Peds  Hematology  (+) anemia ,   Anesthesia Other Findings   Reproductive/Obstetrics                            Anesthesia Physical Anesthesia Plan  ASA: III  Anesthesia Plan: General   Post-op Pain Management:    Induction: Intravenous  Airway Management Planned: LMA  Additional Equipment:   Intra-op Plan:   Post-operative Plan:   Informed Consent: I have reviewed the patients History and Physical, chart, labs and discussed the procedure including the risks, benefits and alternatives for the proposed anesthesia with the patient or authorized representative who has indicated his/her understanding and acceptance.   Dental advisory given  Plan Discussed with: CRNA, Anesthesiologist and Surgeon  Anesthesia Plan Comments:        Anesthesia Quick Evaluation

## 2016-07-14 NOTE — Op Note (Signed)
OPERATIVE NOTE   PROCEDURE: 1. left first stage basilic vein transposition (brachiobasilic arteriovenous fistula) placement  PRE-OPERATIVE DIAGNOSIS: end stage renal disease   POST-OPERATIVE DIAGNOSIS: same as above   SURGEON: Adele Barthel, MD  ASSISTANT(S): Gerri Lins, PAC   ANESTHESIA: general  ESTIMATED BLOOD LOSS: 50 cc  FINDING(S): 1. Small basilic vein: 2.5 mm externally, hydrodistends to 4 mm however 2. Faint thrill at end of case 3. Dopplerable radial and ulnar signal 4. Possible high bifurcation   SPECIMEN(S):  none  INDICATIONS:   Allen Miller is a 60 y.o. male who presents with end stage renal disease.  The patient is scheduled for left first stage basilic vein transposition vs. arteriovenous graft placement.  The patient is aware the risks include but are not limited to: bleeding, infection, steal syndrome, nerve damage, ischemic monomelic neuropathy, failure to mature, and need for additional procedures.  The patient is aware of the risks of the procedure and elects to proceed forward.   DESCRIPTION: After full informed written consent was obtained from the patient, the patient was brought back to the operating room and placed supine upon the operating table.  Prior to induction, the patient received IV antibiotics.   After obtaining adequate anesthesia, the patient was then prepped and draped in the standard fashion for a left arm access procedure.  I turned my attention first to identifying the patient's basilic vein and brachial artery.  Using SonoSite guidance, the location of these vessels were marked out on the skin.   There appeared to be a possible high bifurcation at the level of the antecubitum with both arteries, roughly the same size.  At this point, I injected local anesthetic to obtain a field block of the antecubitum.  In total, I injected about 3 mL of 1% lidocaine without epinephrine.  I made a transverse incision at the level of the antecubitum  and dissected through the subcutaneous tissue and fascia to gain exposure of the presumed brachial artery.  This was noted to be 3 mm in diameter externally.  This was dissected out proximally and distally and controlled with vessel loops .  I then dissected out the basilic vein.  This was noted to be 2.5 mm in diameter externally.  The distal segment of the vein was ligated with a  2-0 silk, and the vein was transected.  The proximal segment was interrogated with serial dilators.  The vein accepted up to a 4 mm dilator without any difficulty.  The vein distended to 4 mm with hydrodistension.  I then instilled the heparinized saline into the vein and clamped it.  At this point, I reset my exposure of the brachial artery and placed the artery under tension proximally and distally.  I made an arteriotomy with a #11 blade, and then I extended the arteriotomy with a Potts scissor.  I injected heparinized saline proximal and distal to this arteriotomy.  The vein was then sewn to the artery in an end-to-side configuration with a running stitch of 7-0 Prolene.  Prior to completing this anastomosis, I allowed the vein and artery to backbleed.  There was no evidence of clot from any vessels.  I completed the anastomosis in the usual fashion and then released all vessel loops and clamps.  There was a faintly palpable thrill in the venous outflow, and there was a dopplerable radial and ulnar signals.  At this point, I irrigated out the surgical wound.  There was no further active bleeding.  The subcutaneous  tissue was reapproximated with a running stitch of 3-0 Vicryl.  The skin was then reapproximated with a running subcuticular stitch of 4-0 Vicryl.  The skin was then cleaned, dried, and reinforced with Dermabond.  The patient tolerated this procedure well.    COMPLICATIONS: none  CONDITION: stable   Adele Barthel, MD, [facility] of [place] Office: [phone] Pager:  [phone]  07/14/2016, 9:14 AM

## 2016-07-14 NOTE — Telephone Encounter (Signed)
Sched appt 9/22 at 1:00. Spoke to pt to inform them of appt.

## 2016-07-14 NOTE — H&P (View-Only) (Signed)
[facility] Specialist of [facility]  Patient name: Thorin Rutland MRN: [id number] DOB: 1956/11/04 Sex: male  REASON FOR VISIT: Discuss access for hemodialysis  HPI: Tavarez Afonso is a 60 y.o. male known to me from placement of right IJ hemodialysis catheter and left radiocephalic AV fistula creation on 02/24/2016. He been seen in our office in June and was felt to be ready for access of his fistula at total of 3 months. He reports that this was used for several occasions and then failed and has been using his catheter. He dialyzes at the [facility]. He does not have any steal symptoms.  Past Medical History:  Diagnosis Date  . Hypertension   . Renal disorder   . Shortness of breath dyspnea    when lying flat  . Stroke [facility])    effective vision    History reviewed. No pertinent family history.  SOCIAL HISTORY: Social History  Substance Use Topics  . Smoking status: Never Smoker  . Smokeless tobacco: Never Used  . Alcohol use No    No Known Allergies  Current Outpatient Prescriptions  Medication Sig Dispense Refill  . amLODipine (NORVASC) 10 MG tablet Take 1 tablet (10 mg total) by mouth daily. 30 tablet 0  . aspirin 81 MG chewable tablet Chew 1 tablet (81 mg total) by mouth daily. 30 tablet 5  . atorvastatin (LIPITOR) 40 MG tablet Take 1 tablet (40 mg total) by mouth daily at 6 PM. 30 tablet 0  . calcium acetate (PHOSLO) 667 MG capsule Take 667 mg by mouth 3 (three) times daily.  6  . carvedilol (COREG) 12.5 MG tablet Take 1 tablet (12.5 mg total) by mouth 2 (two) times daily with a meal. 60 tablet 0  . clopidogrel (PLAVIX) 75 MG tablet Take 1 tablet (75 mg total) by mouth daily. 30 tablet 3  . furosemide (LASIX) 80 MG tablet Take 1 tablet (80 mg total) by mouth 2 (two) times daily. 60 tablet 0  . hydrALAZINE (APRESOLINE) 100 MG tablet Take 0.5 tablets (50 mg total) by mouth 3 (three) times daily. 90 tablet 0  . losartan-hydrochlorothiazide (HYZAAR) 100-25 MG  tablet TAKE 1 TAB BY MOUTH ONCE A DAY.  2  . oxyCODONE (ROXICODONE) 5 MG immediate release tablet Take 1 tablet (5 mg total) by mouth every 6 (six) hours as needed. 20 tablet 0  . polyethylene glycol powder (MIRALAX) powder Take 17 g by mouth daily. Twice a day x 2 days, then daily, followed by daily PRN once regular bowel movements 255 g 0   No current facility-administered medications for this visit.     REVIEW OF SYSTEMS:  [X]  denotes positive finding, [ ]  denotes negative finding Cardiac  Comments:  Chest pain or chest pressure:    Shortness of breath upon exertion:    Short of breath when lying flat:    Irregular heart rhythm:        Vascular    Pain in calf, thigh, or hip brought on by ambulation:    Pain in feet at night that wakes you up from your sleep:     Blood clot in your veins:    Leg swelling:         Pulmonary    Oxygen at home:    Productive cough:     Wheezing:         Neurologic    Sudden weakness in arms or legs:     Sudden numbness in arms or legs:  Sudden onset of difficulty speaking or slurred speech:    Temporary loss of vision in one eye:  x   Problems with dizziness:         Gastrointestinal    Blood in stool:     Vomited blood:         Genitourinary    Burning when urinating:     Blood in urine:        Psychiatric    Major depression:         Hematologic    Bleeding problems:    Problems with blood clotting too easily:        Skin    Rashes or ulcers:        Constitutional    Fever or chills:      PHYSICAL EXAM: Vitals:   07/02/16 1019  BP: (!) 143/88  Pulse: 83  Resp: 18  Temp: 97.6 F (36.4 C)  TempSrc: Oral  Weight: 175 lb (79.4 kg)  Height: 5\' 3"  (1.6 m)    GENERAL: The patient is a well-nourished male, in no acute distress. The vital signs are documented above. VASCULAR: 2+ radial pulses bilaterally reviewed no thrill in her left arm AV fistula PULMONARY: There is good air exchange ABDOMEN: Soft and non-tender  with normal pitched bowel sounds.  MUSCULOSKELETAL: There are no major deformities or cyanosis. NEUROLOGIC: No focal weakness or paresthesias are detected. SKIN: There are no ulcers or rashes noted. PSYCHIATRIC: The patient has a normal affect.  I look with SonoSite and he does have occlusion of his fistula. The left upper arm cephalic vein is small. He does have a moderate-sized basilic vein in the upper arm.  MEDICAL ISSUES: Discussed options with patient. I would recommend left upper arm basilic vein fistula versus AV graft depending on his vein size time of surgery. He currently dialyzes on Monday Wednesday and Friday. Will be scheduled for outpatient left arm access on a Tuesday or Thursday. Understands that I am in the office on these days and one of my partners will be doing the procedure.    Curt Jews [facility] of [employer] [phone]

## 2016-07-15 ENCOUNTER — Encounter (HOSPITAL_COMMUNITY): Payer: Self-pay | Admitting: Vascular Surgery

## 2016-07-15 NOTE — Anesthesia Postprocedure Evaluation (Signed)
Anesthesia Post Note  Patient: Allen Miller  Procedure(s) Performed: Procedure(s) (LRB): LEFT BASILIC VEIN TRANSPOSITION (Left)  Patient location during evaluation: PACU Anesthesia Type: General Level of consciousness: awake and alert and patient cooperative Pain management: pain level controlled Vital Signs Assessment: post-procedure vital signs reviewed and stable Respiratory status: spontaneous breathing and respiratory function stable Cardiovascular status: stable Anesthetic complications: no    Last Vitals:  Vitals:   07/14/16 1000 07/14/16 1017  BP: (!) 161/87 (!) 151/82  Pulse: 74 75  Resp: 17 16  Temp: 36.4 C     Last Pain:  Vitals:   07/14/16 1000  TempSrc:   PainSc: 2                  Eleonor Ocon S

## 2016-07-16 ENCOUNTER — Encounter: Payer: BLUE CROSS/BLUE SHIELD | Admitting: Internal Medicine

## 2016-07-16 ENCOUNTER — Ambulatory Visit: Payer: BLUE CROSS/BLUE SHIELD | Admitting: Internal Medicine

## 2016-08-17 ENCOUNTER — Encounter: Payer: Self-pay | Admitting: Vascular Surgery

## 2016-08-20 NOTE — Progress Notes (Signed)
Postoperative Access Visit   History of Present Illness  Allen Miller is a 60 y.o. year old male who presents for postoperative follow-up for: L 1st BVT (Date: 07/14/16).  The patient's wounds are healed.  The patient notes no overt steal symptoms.  The patient is able to complete their activities of daily living.  The patient's current symptoms are: mild forearm numbness.  Past Medical History:  Diagnosis Date  . Hypertension   . Renal disorder   . Shortness of breath dyspnea    when lying flat  . Stroke [facility])    effective vision    Past Surgical History:  Procedure Laterality Date  . BASCILIC VEIN TRANSPOSITION Left 02/24/2016   Procedure: BASILIC VEIN TRANSPOSITION VERSUS ARTERIOVENOUS GRAFT INSERTION;  Surgeon: [name], MD;  Location: [place];  Service: Vascular;  Laterality: Left;  . BASCILIC VEIN TRANSPOSITION Left 07/14/2016   Procedure: LEFT BASILIC VEIN TRANSPOSITION;  Surgeon: Conrad Brookdale, MD;  Location: [place];  Service: Vascular;  Laterality: Left;  . INSERTION OF DIALYSIS CATHETER N/A 02/24/2016   Procedure: INSERTION OF DIALYSIS CATHETER;  Surgeon: [name], MD;  Location: [facility] OR;  Service: Vascular;  Laterality: N/A;    Social History   Social History  . Marital status: Single    Spouse name: N/A  . Number of children: N/A  . Years of education: N/A   Occupational History  . Not on file.   Social History Main Topics  . Smoking status: Never Smoker  . Smokeless tobacco: Never Used  . Alcohol use No  . Drug use: No  . Sexual activity: Not on file   Other Topics Concern  . Not on file   Social History Narrative  . No narrative on file    No family history on file.   Current Outpatient Prescriptions  Medication Sig Dispense Refill  . amLODipine (NORVASC) 10 MG tablet Take 1 tablet (10 mg total) by mouth daily. 30 tablet 0  . aspirin 81 MG chewable tablet Chew 1 tablet (81 mg total) by mouth daily. 30 tablet 5  . calcium acetate (PHOSLO)  667 MG capsule Take 667 mg by mouth 3 (three) times daily.  6  . carvedilol (COREG) 12.5 MG tablet Take 1 tablet (12.5 mg total) by mouth 2 (two) times daily with a meal. 60 tablet 0  . furosemide (LASIX) 80 MG tablet Take 1 tablet (80 mg total) by mouth 2 (two) times daily. 60 tablet 0  . hydrALAZINE (APRESOLINE) 100 MG tablet Take 0.5 tablets (50 mg total) by mouth 3 (three) times daily. 90 tablet 0  . losartan-hydrochlorothiazide (HYZAAR) 100-25 MG tablet TAKE 1 TAB BY MOUTH ONCE A DAY.  2  . oxyCODONE-acetaminophen (PERCOCET/ROXICET) 5-325 MG tablet Take 1 tablet by mouth every 6 (six) hours as needed. (Patient not taking: Reported on 08/21/2016) 6 tablet 0   No current facility-administered medications for this visit.      Allergies  Allergen Reactions  . No Known Allergies      REVIEW OF SYSTEMS:  (Positives checked otherwise negative)  CARDIOVASCULAR:   [ ]  chest pain,  [ ]  chest pressure,  [ ]  palpitations,  [ ]  shortness of breath when laying flat,  [ ]  shortness of breath with exertion,   [ ]  pain in feet when walking,  [ ]  pain in feet when laying flat, [ ]  history of blood clot in veins (DVT),  [ ]  history of phlebitis,  [ ]   swelling in legs,  [ ]  varicose veins  PULMONARY:   [ ]  productive cough,  [ ]  asthma,  [ ]  wheezing  NEUROLOGIC:   [ ]  weakness in arms or legs,  [ ]  numbness in arms or legs,  [ ]  difficulty speaking or slurred speech,  [ ]  temporary loss of vision in one eye,  [ ]  dizziness  HEMATOLOGIC:   [ ]  bleeding problems,  [ ]  problems with blood clotting too easily  MUSCULOSKEL:   [ ]  joint pain, [ ]  joint swelling  GASTROINTEST:   [ ]  vomiting blood,  [ ]  blood in stool     GENITOURINARY:   [ ]  burning with urination,  [ ]  blood in urine [x]  end stage renal disease-HD: M/W/F   PSYCHIATRIC:   [ ]  history of major depression  INTEGUMENTARY:   [ ]  rashes,  [ ]  ulcers  CONSTITUTIONAL:   [ ]  fever,  [ ]  chills   For VQI Use  Only  PRE-ADM LIVING: Home  AMB STATUS: Ambulatory  Physical Examination Vitals:   08/21/16 1244  BP: 122/75  Pulse: 85  Resp: (!) 21  Temp: 98.7 F (37.1 C)   Pulmonary: Sym exp, good air movt, CTAB, no rales, rhonchi, & wheezing  Cardiac: RRR, Nl S1, S2, no Murmurs, rubs or gallops  LUE: Incision is healed, skin feels warm, hand grip is 5/5, sensation in digits is intact, palpable thrill, bruit can be auscultated, palpable radial pulse, on sonosite: fistula > 6 mm throughout most of basilic vein   Medical Decision Making  Allen Miller is a 60 y.o. year old male who presents s/p L 1st BVT.   The patient's access is ready for transposition. Risk, benefits, and alternatives to access surgery were discussed.   The patient is aware the risks include but are not limited to: bleeding, infection, steal syndrome, nerve damage, ischemic monomelic neuropathy, failure to mature, need for additional procedures, death and stroke.  The patient is scheduled for Tuesday, 26 SEP 17. The patient agrees to proceed forward with the procedure.  Thank you for allowing [place] to participate in this patient's care.  Adele Barthel, MD, FACS [facility] of [place] Office: [phone] Pager: [phone]

## 2016-08-21 ENCOUNTER — Other Ambulatory Visit: Payer: Self-pay

## 2016-08-21 ENCOUNTER — Encounter: Payer: Self-pay | Admitting: Vascular Surgery

## 2016-08-21 ENCOUNTER — Ambulatory Visit (INDEPENDENT_AMBULATORY_CARE_PROVIDER_SITE_OTHER): Payer: Medicare Other | Admitting: Vascular Surgery

## 2016-08-21 VITALS — BP 122/75 | HR 85 | Temp 98.7°F | Resp 21 | Ht 63.0 in | Wt 179.1 lb

## 2016-08-21 DIAGNOSIS — N186 End stage renal disease: Secondary | ICD-10-CM

## 2016-08-21 DIAGNOSIS — Z992 Dependence on renal dialysis: Secondary | ICD-10-CM

## 2016-08-24 ENCOUNTER — Encounter (HOSPITAL_COMMUNITY): Payer: Self-pay | Admitting: *Deleted

## 2016-08-24 MED ORDER — OXYCODONE HCL 5 MG PO TABS: 5.0000 mg | ORAL_TABLET | Freq: Once | ORAL | Status: DC | PRN

## 2016-08-24 MED ORDER — ONDANSETRON HCL 4 MG/2ML IJ SOLN: 4.0000 mg | Freq: Four times a day (QID) | INTRAMUSCULAR | Status: DC | PRN

## 2016-08-24 MED ORDER — FENTANYL CITRATE (PF) 100 MCG/2ML IJ SOLN: 25.0000 ug | INTRAMUSCULAR | Status: DC | PRN

## 2016-08-24 MED ORDER — OXYCODONE HCL 5 MG/5ML PO SOLN: 5.0000 mg | Freq: Once | ORAL | Status: DC | PRN

## 2016-08-25 ENCOUNTER — Encounter (HOSPITAL_COMMUNITY): Payer: Self-pay | Admitting: Anesthesiology

## 2016-08-25 ENCOUNTER — Encounter (HOSPITAL_COMMUNITY)
Admission: RE | Disposition: A | Discharge status: Home / Self Care | Payer: Self-pay | Source: Ambulatory Visit | Attending: Vascular Surgery

## 2016-08-25 ENCOUNTER — Non-Acute Institutional Stay (HOSPITAL_COMMUNITY)
Admission: RE | Admit: 2016-08-25 | Discharge: 2016-08-25 | Disposition: A | Discharge status: Home / Self Care | Payer: BLUE CROSS/BLUE SHIELD | Source: Ambulatory Visit | Attending: Vascular Surgery | Admitting: Vascular Surgery

## 2016-08-25 ENCOUNTER — Encounter (HOSPITAL_COMMUNITY): Payer: Self-pay | Admitting: *Deleted

## 2016-08-25 DIAGNOSIS — E875 Hyperkalemia: Secondary | ICD-10-CM | POA: Diagnosis present

## 2016-08-25 DIAGNOSIS — Z538 Procedure and treatment not carried out for other reasons: Secondary | ICD-10-CM | POA: Insufficient documentation

## 2016-08-25 DIAGNOSIS — N186 End stage renal disease: Secondary | ICD-10-CM | POA: Diagnosis not present

## 2016-08-25 DIAGNOSIS — Z8673 Personal history of transient ischemic attack (TIA), and cerebral infarction without residual deficits: Secondary | ICD-10-CM | POA: Insufficient documentation

## 2016-08-25 DIAGNOSIS — I120 Hypertensive chronic kidney disease with stage 5 chronic kidney disease or end stage renal disease: Secondary | ICD-10-CM | POA: Insufficient documentation

## 2016-08-25 HISTORY — DX: End stage renal disease: N18.6

## 2016-08-25 LAB — CBC
HCT: 35.8 % — ABNORMAL LOW (ref 39.0–52.0)
HEMOGLOBIN: 11.3 g/dL — AB (ref 13.0–17.0)
MCH: 25.9 pg — AB (ref 26.0–34.0)
MCHC: 31.6 g/dL (ref 30.0–36.0)
MCV: 82.1 fL (ref 78.0–100.0)
PLATELETS: 244 10*3/uL (ref 150–400)
RBC: 4.36 MIL/uL (ref 4.22–5.81)
RDW: 18.4 % — ABNORMAL HIGH (ref 11.5–15.5)
WBC: 6 10*3/uL (ref 4.0–10.5)

## 2016-08-25 LAB — POTASSIUM: Potassium: 6.4 mmol/L (ref 3.5–5.1)

## 2016-08-25 LAB — POCT I-STAT 4, (NA,K, GLUC, HGB,HCT)
Glucose, Bld: 111 mg/dL — ABNORMAL HIGH (ref 65–99)
HEMATOCRIT: 40 % (ref 39.0–52.0)
HEMOGLOBIN: 13.6 g/dL (ref 13.0–17.0)
Potassium: 6.3 mmol/L (ref 3.5–5.1)
Sodium: 136 mmol/L (ref 135–145)

## 2016-08-25 LAB — RENAL FUNCTION PANEL
ANION GAP: 13 (ref 5–15)
Albumin: 3.6 g/dL (ref 3.5–5.0)
BUN: 65 mg/dL — ABNORMAL HIGH (ref 6–20)
CALCIUM: 8.4 mg/dL — AB (ref 8.9–10.3)
CO2: 24 mmol/L (ref 22–32)
CREATININE: 9.05 mg/dL — AB (ref 0.61–1.24)
Chloride: 99 mmol/L — ABNORMAL LOW (ref 101–111)
GFR calc non Af Amer: 6 mL/min — ABNORMAL LOW (ref >60)
GFR, EST AFRICAN AMERICAN: 7 mL/min — AB (ref >60)
Glucose, Bld: 123 mg/dL — ABNORMAL HIGH (ref 65–99)
PHOSPHORUS: 8.1 mg/dL — AB (ref 2.5–4.6)
Potassium: 6 mmol/L — ABNORMAL HIGH (ref 3.5–5.1)
SODIUM: 136 mmol/L (ref 135–145)

## 2016-08-25 SURGERY — TRANSPOSITION, VEIN, BASILIC
Anesthesia: Choice | Laterality: Left

## 2016-08-25 MED ORDER — SODIUM CHLORIDE 0.9 % IV SOLN
INTRAVENOUS | Status: DC
  Administered 2016-08-25: 09:00:00 via INTRAVENOUS

## 2016-08-25 MED ORDER — CARVEDILOL 12.5 MG PO TABS
12.5000 mg | ORAL_TABLET | Freq: Once | ORAL | Status: AC
  Administered 2016-08-25: 12.5 mg via ORAL
  Filled 2016-08-25: qty 1

## 2016-08-25 MED ORDER — CARVEDILOL 12.5 MG PO TABS
ORAL_TABLET | ORAL | Status: AC
  Filled 2016-08-25: qty 1

## 2016-08-25 MED ORDER — SODIUM CHLORIDE 0.9 % IV SOLN: 100.0000 mL | INTRAVENOUS | Status: DC | PRN

## 2016-08-25 MED ORDER — SODIUM CHLORIDE 0.9 % IV SOLN: INTRAVENOUS | Status: DC

## 2016-08-25 MED ORDER — ALTEPLASE 2 MG IJ SOLR: 2.0000 mg | Freq: Once | INTRAMUSCULAR | Status: DC | PRN

## 2016-08-25 MED ORDER — CHLORHEXIDINE GLUCONATE CLOTH 2 % EX PADS: 6.0000 | MEDICATED_PAD | Freq: Once | CUTANEOUS | Status: DC

## 2016-08-25 MED ORDER — PENTAFLUOROPROP-TETRAFLUOROETH EX AERO: 1.0000 "application " | INHALATION_SPRAY | CUTANEOUS | Status: DC | PRN

## 2016-08-25 MED ORDER — HEPARIN SODIUM (PORCINE) 1000 UNIT/ML DIALYSIS: 1000.0000 [IU] | INTRAMUSCULAR | Status: DC | PRN

## 2016-08-25 MED ORDER — LIDOCAINE HCL (PF) 1 % IJ SOLN: 5.0000 mL | INTRAMUSCULAR | Status: DC | PRN

## 2016-08-25 MED ORDER — FENTANYL CITRATE (PF) 100 MCG/2ML IJ SOLN
INTRAMUSCULAR | Status: AC
  Filled 2016-08-25: qty 2

## 2016-08-25 MED ORDER — DEXTROSE 5 % IV SOLN
INTRAVENOUS | Status: AC
  Filled 2016-08-25: qty 1.5

## 2016-08-25 MED ORDER — MIDAZOLAM HCL 2 MG/2ML IJ SOLN
INTRAMUSCULAR | Status: AC
  Filled 2016-08-25: qty 2

## 2016-08-25 MED ORDER — PROPOFOL 10 MG/ML IV BOLUS
INTRAVENOUS | Status: AC
  Filled 2016-08-25: qty 20

## 2016-08-25 MED ORDER — DEXTROSE 5 % IV SOLN: 1.5000 g | INTRAVENOUS | Status: DC

## 2016-08-25 MED ORDER — LIDOCAINE-PRILOCAINE 2.5-2.5 % EX CREA: 1.0000 "application " | TOPICAL_CREAM | CUTANEOUS | Status: DC | PRN

## 2016-08-25 NOTE — Progress Notes (Signed)
Patient arrived to unit by [facility].  Reviewed treatment plan and this RN agrees with plan.  Report received from bedside RN, Horris Latino.  Consent obtained.  Patient A & O X 4.   Lung sounds diminished to ausculation in all fields. Generalized edema. Cardiac:  NSR.  Removed caps and cleansed RIJ catheter with chlorhedxidine.  Aspirated ports of heparin and flushed them with saline per protocol.  Connected and secured lines, initiated treatment at 1230.  UF Goal of 555mL and net fluid removal 0L.  Will continue to monitor.

## 2016-08-25 NOTE — Progress Notes (Signed)
Dialysis treatment completed.  500 mL ultrafiltrated.  0 mL net fluid removal.  Patient status unchanged. Lung sounds clear to ausculation in all fields. Generalized edema. Cardiac: NSR.  Cleansed RIJ catheter with chlorhexidine.  Disconnected lines and flushed ports with saline per protocol.  Ports locked with heparin and capped per protocol.    Patient discharged home per self.Allen Miller

## 2016-08-25 NOTE — Progress Notes (Signed)
Report called to [facility]  In the ED. [employer] nurse called and informed of pt being on monitor states to send him down they will monitor him.

## 2016-08-25 NOTE — Interval H&P Note (Signed)
History and Physical Interval Note:  08/25/2016 8:04 AM  Allen Miller  has presented today for surgery, with the diagnosis of End Stage Renal Disease N18.6  The various methods of treatment have been discussed with the patient and family. After consideration of risks, benefits and other options for treatment, the patient has consented to  Procedure(s): SECOND STAGE BASILIC VEIN TRANSPOSITION (Left) as a surgical intervention .  The patient's history has been reviewed, patient examined, no change in status, stable for surgery.  I have reviewed the patient's chart and labs.  Questions were answered to the patient's satisfaction.     Adele Barthel

## 2016-08-25 NOTE — Progress Notes (Signed)
The PA from renal  called and states pt needs to go to dialysis. Dialysis called and informed of pt coming for dialysis, states she will call be right back. Janett Billow in the ED called and informed pt is going to Dialysis.

## 2016-08-25 NOTE — Progress Notes (Signed)
Pt transported to hemodialysis on tele monitor, with belongings.

## 2016-08-25 NOTE — Progress Notes (Signed)
Potassium 6.4 called to Dr Seward Speck states to let DR Bridgett Larsson know.

## 2016-08-25 NOTE — Progress Notes (Signed)
Dr Bridgett Larsson informed pt doesn't have a Nephrologist, states to send pt to the ED.

## 2016-08-25 NOTE — Progress Notes (Signed)
Dr Bridgett Larsson called and informed of potassium level and that Dr Glennon Mac states we will not be able to do his surgery today. Dr Bridgett Larsson states to call Nephrologist and see if they can see him.

## 2016-08-25 NOTE — H&P (View-Only) (Signed)
Postoperative Access Visit   History of Present Illness  Allen Miller is a 60 y.o. year old male who presents for postoperative follow-up for: L 1st BVT (Date: 07/14/16).  The patient's wounds are healed.  The patient notes no overt steal symptoms.  The patient is able to complete their activities of daily living.  The patient's current symptoms are: mild forearm numbness.  Past Medical History:  Diagnosis Date  . Hypertension   . Renal disorder   . Shortness of breath dyspnea    when lying flat  . Stroke [facility])    effective vision    Past Surgical History:  Procedure Laterality Date  . BASCILIC VEIN TRANSPOSITION Left 02/24/2016   Procedure: BASILIC VEIN TRANSPOSITION VERSUS ARTERIOVENOUS GRAFT INSERTION;  Surgeon: [name], MD;  Location: [place];  Service: Vascular;  Laterality: Left;  . BASCILIC VEIN TRANSPOSITION Left 07/14/2016   Procedure: LEFT BASILIC VEIN TRANSPOSITION;  Surgeon: Conrad Damascus, MD;  Location: [place];  Service: Vascular;  Laterality: Left;  . INSERTION OF DIALYSIS CATHETER N/A 02/24/2016   Procedure: INSERTION OF DIALYSIS CATHETER;  Surgeon: [name], MD;  Location: [facility] OR;  Service: Vascular;  Laterality: N/A;    Social History   Social History  . Marital status: Single    Spouse name: N/A  . Number of children: N/A  . Years of education: N/A   Occupational History  . Not on file.   Social History Main Topics  . Smoking status: Never Smoker  . Smokeless tobacco: Never Used  . Alcohol use No  . Drug use: No  . Sexual activity: Not on file   Other Topics Concern  . Not on file   Social History Narrative  . No narrative on file    No family history on file.   Current Outpatient Prescriptions  Medication Sig Dispense Refill  . amLODipine (NORVASC) 10 MG tablet Take 1 tablet (10 mg total) by mouth daily. 30 tablet 0  . aspirin 81 MG chewable tablet Chew 1 tablet (81 mg total) by mouth daily. 30 tablet 5  . calcium acetate (PHOSLO)  667 MG capsule Take 667 mg by mouth 3 (three) times daily.  6  . carvedilol (COREG) 12.5 MG tablet Take 1 tablet (12.5 mg total) by mouth 2 (two) times daily with a meal. 60 tablet 0  . furosemide (LASIX) 80 MG tablet Take 1 tablet (80 mg total) by mouth 2 (two) times daily. 60 tablet 0  . hydrALAZINE (APRESOLINE) 100 MG tablet Take 0.5 tablets (50 mg total) by mouth 3 (three) times daily. 90 tablet 0  . losartan-hydrochlorothiazide (HYZAAR) 100-25 MG tablet TAKE 1 TAB BY MOUTH ONCE A DAY.  2  . oxyCODONE-acetaminophen (PERCOCET/ROXICET) 5-325 MG tablet Take 1 tablet by mouth every 6 (six) hours as needed. (Patient not taking: Reported on 08/21/2016) 6 tablet 0   No current facility-administered medications for this visit.      Allergies  Allergen Reactions  . No Known Allergies      REVIEW OF SYSTEMS:  (Positives checked otherwise negative)  CARDIOVASCULAR:   [ ]  chest pain,  [ ]  chest pressure,  [ ]  palpitations,  [ ]  shortness of breath when laying flat,  [ ]  shortness of breath with exertion,   [ ]  pain in feet when walking,  [ ]  pain in feet when laying flat, [ ]  history of blood clot in veins (DVT),  [ ]  history of phlebitis,  [ ]   swelling in legs,  [ ]  varicose veins  PULMONARY:   [ ]  productive cough,  [ ]  asthma,  [ ]  wheezing  NEUROLOGIC:   [ ]  weakness in arms or legs,  [ ]  numbness in arms or legs,  [ ]  difficulty speaking or slurred speech,  [ ]  temporary loss of vision in one eye,  [ ]  dizziness  HEMATOLOGIC:   [ ]  bleeding problems,  [ ]  problems with blood clotting too easily  MUSCULOSKEL:   [ ]  joint pain, [ ]  joint swelling  GASTROINTEST:   [ ]  vomiting blood,  [ ]  blood in stool     GENITOURINARY:   [ ]  burning with urination,  [ ]  blood in urine [x]  end stage renal disease-HD: M/W/F   PSYCHIATRIC:   [ ]  history of major depression  INTEGUMENTARY:   [ ]  rashes,  [ ]  ulcers  CONSTITUTIONAL:   [ ]  fever,  [ ]  chills   For VQI Use  Only  PRE-ADM LIVING: Home  AMB STATUS: Ambulatory  Physical Examination Vitals:   08/21/16 1244  BP: 122/75  Pulse: 85  Resp: (!) 21  Temp: 98.7 F (37.1 C)   Pulmonary: Sym exp, good air movt, CTAB, no rales, rhonchi, & wheezing  Cardiac: RRR, Nl S1, S2, no Murmurs, rubs or gallops  LUE: Incision is healed, skin feels warm, hand grip is 5/5, sensation in digits is intact, palpable thrill, bruit can be auscultated, palpable radial pulse, on sonosite: fistula > 6 mm throughout most of basilic vein   Medical Decision Making  Allen Miller is a 60 y.o. year old male who presents s/p L 1st BVT.   The patient's access is ready for transposition. Risk, benefits, and alternatives to access surgery were discussed.   The patient is aware the risks include but are not limited to: bleeding, infection, steal syndrome, nerve damage, ischemic monomelic neuropathy, failure to mature, need for additional procedures, death and stroke.  The patient is scheduled for Tuesday, 26 SEP 17. The patient agrees to proceed forward with the procedure.  Thank you for allowing [place] to participate in this patient's care.  Adele Barthel, MD, FACS [facility] of [place] Office: [phone] Pager: [phone]

## 2016-08-25 NOTE — Progress Notes (Signed)
   Daily Progress Note  Repeat K remains elevated.  Case is canceled.  BMET    Component Value Date/Time   NA 136 08/25/2016 0944   K 6.4 (HH) 08/25/2016 1020   CL 97 (L) 04/16/2016 2116   CO2 27 04/16/2016 2116   GLUCOSE 111 (H) 08/25/2016 0944   BUN 23 (H) 04/16/2016 2116   CREATININE 4.05 (H) 04/16/2016 2116   CREATININE 8.80 (H) 01/29/2016 1546   CALCIUM 7.9 (L) 04/16/2016 2116   GFRNONAA 15 (L) 04/16/2016 2116   GFRAA 17 (L) 04/16/2016 2116   - Will defer further mgmt to Nephrology   Adele Barthel, MD [facility] of [place] Office: [phone] Pager: [phone]  08/25/2016, 11:06 AM

## 2016-08-25 NOTE — Progress Notes (Signed)
Dr Lyndle Herrlich called and informed of Potassium Level from I stat (6.3)

## 2016-08-27 ENCOUNTER — Telehealth: Payer: Self-pay

## 2016-08-27 ENCOUNTER — Other Ambulatory Visit: Payer: Self-pay

## 2016-08-27 NOTE — Telephone Encounter (Signed)
Entered in error

## 2016-09-07 ENCOUNTER — Encounter (HOSPITAL_COMMUNITY): Payer: Self-pay | Admitting: *Deleted

## 2016-09-08 ENCOUNTER — Ambulatory Visit (HOSPITAL_COMMUNITY): Payer: BLUE CROSS/BLUE SHIELD | Admitting: Anesthesiology

## 2016-09-08 ENCOUNTER — Ambulatory Visit (HOSPITAL_COMMUNITY)
Admission: RE | Admit: 2016-09-08 | Discharge: 2016-09-08 | Disposition: A | Discharge status: Home / Self Care | Payer: BLUE CROSS/BLUE SHIELD | Source: Ambulatory Visit | Attending: Vascular Surgery | Admitting: Vascular Surgery

## 2016-09-08 ENCOUNTER — Encounter (HOSPITAL_COMMUNITY)
Admission: RE | Disposition: A | Discharge status: Home / Self Care | Payer: Self-pay | Source: Ambulatory Visit | Attending: Vascular Surgery

## 2016-09-08 DIAGNOSIS — Z6831 Body mass index (BMI) 31.0-31.9, adult: Secondary | ICD-10-CM | POA: Diagnosis not present

## 2016-09-08 DIAGNOSIS — E669 Obesity, unspecified: Secondary | ICD-10-CM | POA: Insufficient documentation

## 2016-09-08 DIAGNOSIS — I120 Hypertensive chronic kidney disease with stage 5 chronic kidney disease or end stage renal disease: Secondary | ICD-10-CM | POA: Diagnosis present

## 2016-09-08 DIAGNOSIS — Z8673 Personal history of transient ischemic attack (TIA), and cerebral infarction without residual deficits: Secondary | ICD-10-CM | POA: Diagnosis not present

## 2016-09-08 DIAGNOSIS — Z7982 Long term (current) use of aspirin: Secondary | ICD-10-CM | POA: Diagnosis not present

## 2016-09-08 DIAGNOSIS — Z79899 Other long term (current) drug therapy: Secondary | ICD-10-CM | POA: Diagnosis not present

## 2016-09-08 DIAGNOSIS — N186 End stage renal disease: Secondary | ICD-10-CM | POA: Insufficient documentation

## 2016-09-08 HISTORY — PX: BASCILIC VEIN TRANSPOSITION: SHX5742

## 2016-09-08 LAB — POCT I-STAT 4, (NA,K, GLUC, HGB,HCT)
Glucose, Bld: 95 mg/dL (ref 65–99)
HEMATOCRIT: 32 % — AB (ref 39.0–52.0)
HEMOGLOBIN: 10.9 g/dL — AB (ref 13.0–17.0)
Potassium: 4.2 mmol/L (ref 3.5–5.1)
Sodium: 137 mmol/L (ref 135–145)

## 2016-09-08 SURGERY — TRANSPOSITION, VEIN, BASILIC
Anesthesia: General | Site: Arm Upper | Laterality: Left

## 2016-09-08 MED ORDER — ONDANSETRON HCL 4 MG/2ML IJ SOLN
INTRAMUSCULAR | Status: AC
  Filled 2016-09-08: qty 2

## 2016-09-08 MED ORDER — FENTANYL CITRATE (PF) 100 MCG/2ML IJ SOLN
INTRAMUSCULAR | Status: AC
  Filled 2016-09-08: qty 2

## 2016-09-08 MED ORDER — DEXTROSE 5 % IV SOLN
1.5000 g | INTRAVENOUS | Status: AC
  Administered 2016-09-08: 1.5 g via INTRAVENOUS

## 2016-09-08 MED ORDER — LIDOCAINE 2% (20 MG/ML) 5 ML SYRINGE
INTRAMUSCULAR | Status: AC
  Filled 2016-09-08: qty 5

## 2016-09-08 MED ORDER — PROPOFOL 10 MG/ML IV BOLUS
INTRAVENOUS | Status: AC
  Filled 2016-09-08: qty 20

## 2016-09-08 MED ORDER — PROPOFOL 10 MG/ML IV BOLUS
INTRAVENOUS | Status: DC | PRN
  Administered 2016-09-08: 150 mg via INTRAVENOUS

## 2016-09-08 MED ORDER — OXYCODONE-ACETAMINOPHEN 5-325 MG PO TABS: 1.0000 | ORAL_TABLET | Freq: Four times a day (QID) | ORAL | 0 refills | Status: DC | PRN

## 2016-09-08 MED ORDER — HEMOSTATIC AGENTS (NO CHARGE) OPTIME
TOPICAL | Status: DC | PRN
  Administered 2016-09-08: 1 via TOPICAL

## 2016-09-08 MED ORDER — GLYCOPYRROLATE 0.2 MG/ML IV SOSY
PREFILLED_SYRINGE | INTRAVENOUS | Status: AC
  Filled 2016-09-08: qty 3

## 2016-09-08 MED ORDER — PROTAMINE SULFATE 10 MG/ML IV SOLN
INTRAVENOUS | Status: AC
  Filled 2016-09-08: qty 5

## 2016-09-08 MED ORDER — ONDANSETRON HCL 4 MG/2ML IJ SOLN: 4.0000 mg | Freq: Once | INTRAMUSCULAR | Status: DC | PRN

## 2016-09-08 MED ORDER — NEOSTIGMINE METHYLSULFATE 5 MG/5ML IV SOSY
PREFILLED_SYRINGE | INTRAVENOUS | Status: AC
  Filled 2016-09-08: qty 5

## 2016-09-08 MED ORDER — PHENYLEPHRINE HCL 10 MG/ML IJ SOLN
INTRAVENOUS | Status: DC | PRN
  Administered 2016-09-08: 30 ug/min via INTRAVENOUS

## 2016-09-08 MED ORDER — FENTANYL CITRATE (PF) 100 MCG/2ML IJ SOLN
INTRAMUSCULAR | Status: DC | PRN
  Administered 2016-09-08: 25 ug via INTRAVENOUS

## 2016-09-08 MED ORDER — 0.9 % SODIUM CHLORIDE (POUR BTL) OPTIME
TOPICAL | Status: DC | PRN
  Administered 2016-09-08: 1000 mL

## 2016-09-08 MED ORDER — ONDANSETRON HCL 4 MG/2ML IJ SOLN
INTRAMUSCULAR | Status: DC | PRN
  Administered 2016-09-08: 4 mg via INTRAVENOUS

## 2016-09-08 MED ORDER — PHENYLEPHRINE 40 MCG/ML (10ML) SYRINGE FOR IV PUSH (FOR BLOOD PRESSURE SUPPORT)
PREFILLED_SYRINGE | INTRAVENOUS | Status: AC
  Filled 2016-09-08: qty 10

## 2016-09-08 MED ORDER — CARVEDILOL 12.5 MG PO TABS
ORAL_TABLET | ORAL | Status: AC
  Filled 2016-09-08: qty 1

## 2016-09-08 MED ORDER — CARVEDILOL 12.5 MG PO TABS
12.5000 mg | ORAL_TABLET | Freq: Two times a day (BID) | ORAL | Status: DC
  Filled 2016-09-08: qty 1

## 2016-09-08 MED ORDER — FENTANYL CITRATE (PF) 100 MCG/2ML IJ SOLN
25.0000 ug | INTRAMUSCULAR | Status: DC | PRN
  Administered 2016-09-08: 25 ug via INTRAVENOUS

## 2016-09-08 MED ORDER — EPHEDRINE 5 MG/ML INJ
INTRAVENOUS | Status: AC
  Filled 2016-09-08: qty 10

## 2016-09-08 MED ORDER — CHLORHEXIDINE GLUCONATE CLOTH 2 % EX PADS: 6.0000 | MEDICATED_PAD | Freq: Once | CUTANEOUS | Status: DC

## 2016-09-08 MED ORDER — SODIUM CHLORIDE 0.9 % IV SOLN
INTRAVENOUS | Status: DC
  Administered 2016-09-08: 13:00:00 via INTRAVENOUS

## 2016-09-08 MED ORDER — PHENYLEPHRINE HCL 10 MG/ML IJ SOLN
INTRAMUSCULAR | Status: DC | PRN
  Administered 2016-09-08 (×2): 80 ug via INTRAVENOUS
  Administered 2016-09-08 (×2): 40 ug via INTRAVENOUS
  Administered 2016-09-08: 80 ug via INTRAVENOUS

## 2016-09-08 MED ORDER — FENTANYL CITRATE (PF) 100 MCG/2ML IJ SOLN: 25.0000 ug | INTRAMUSCULAR | Status: DC | PRN

## 2016-09-08 MED ORDER — SODIUM CHLORIDE 0.9 % IV SOLN
INTRAVENOUS | Status: DC | PRN
  Administered 2016-09-08: 15:00:00

## 2016-09-08 MED ORDER — DEXTROSE 5 % IV SOLN
INTRAVENOUS | Status: AC
  Filled 2016-09-08: qty 1.5

## 2016-09-08 MED ORDER — HEPARIN SODIUM (PORCINE) 1000 UNIT/ML IJ SOLN
INTRAMUSCULAR | Status: AC
  Filled 2016-09-08: qty 1

## 2016-09-08 SURGICAL SUPPLY — 40 items
ARMBAND PINK RESTRICT EXTREMIT (MISCELLANEOUS) ×3 IMPLANT
CANISTER SUCTION 2500CC (MISCELLANEOUS) ×3 IMPLANT
CLIP TI MEDIUM 24 (CLIP) ×3 IMPLANT
CLIP TI WIDE RED SMALL 24 (CLIP) ×3 IMPLANT
CORDS BIPOLAR (ELECTRODE) IMPLANT
COVER PROBE W GEL 5X96 (DRAPES) ×3 IMPLANT
DECANTER SPIKE VIAL GLASS SM (MISCELLANEOUS) IMPLANT
DERMABOND ADVANCED (GAUZE/BANDAGES/DRESSINGS) ×2
DERMABOND ADVANCED .7 DNX12 (GAUZE/BANDAGES/DRESSINGS) ×1 IMPLANT
ELECT REM PT RETURN 9FT ADLT (ELECTROSURGICAL) ×3
ELECTRODE REM PT RTRN 9FT ADLT (ELECTROSURGICAL) ×1 IMPLANT
GLOVE BIO SURGEON STRL SZ 6.5 (GLOVE) ×2 IMPLANT
GLOVE BIO SURGEON STRL SZ7 (GLOVE) ×3 IMPLANT
GLOVE BIO SURGEONS STRL SZ 6.5 (GLOVE) ×1
GLOVE BIOGEL PI IND STRL 6.5 (GLOVE) ×3 IMPLANT
GLOVE BIOGEL PI IND STRL 7.0 (GLOVE) ×1 IMPLANT
GLOVE BIOGEL PI IND STRL 7.5 (GLOVE) ×2 IMPLANT
GLOVE BIOGEL PI INDICATOR 6.5 (GLOVE) ×6
GLOVE BIOGEL PI INDICATOR 7.0 (GLOVE) ×2
GLOVE BIOGEL PI INDICATOR 7.5 (GLOVE) ×4
GLOVE SURG SS PI 6.5 STRL IVOR (GLOVE) ×3 IMPLANT
GOWN STRL REUS W/ TWL LRG LVL3 (GOWN DISPOSABLE) ×4 IMPLANT
GOWN STRL REUS W/TWL LRG LVL3 (GOWN DISPOSABLE) ×8
HEMOSTAT SPONGE AVITENE ULTRA (HEMOSTASIS) ×3 IMPLANT
KIT BASIN OR (CUSTOM PROCEDURE TRAY) ×3 IMPLANT
KIT ROOM TURNOVER OR (KITS) ×3 IMPLANT
LIQUID BAND (GAUZE/BANDAGES/DRESSINGS) ×3 IMPLANT
NS IRRIG 1000ML POUR BTL (IV SOLUTION) ×3 IMPLANT
PACK CV ACCESS (CUSTOM PROCEDURE TRAY) ×3 IMPLANT
PAD ARMBOARD 7.5X6 YLW CONV (MISCELLANEOUS) ×6 IMPLANT
SUT MNCRL AB 4-0 PS2 18 (SUTURE) ×6 IMPLANT
SUT PROLENE 6 0 BV (SUTURE) ×3 IMPLANT
SUT PROLENE 7 0 BV 1 (SUTURE) IMPLANT
SUT SILK 2 0 SH (SUTURE) IMPLANT
SUT VIC AB 2-0 CT1 27 (SUTURE) ×6
SUT VIC AB 2-0 CT1 TAPERPNT 27 (SUTURE) ×3 IMPLANT
SUT VIC AB 3-0 SH 27 (SUTURE) ×4
SUT VIC AB 3-0 SH 27X BRD (SUTURE) ×2 IMPLANT
UNDERPAD 30X30 (UNDERPADS AND DIAPERS) ×3 IMPLANT
WATER STERILE IRR 1000ML POUR (IV SOLUTION) ×3 IMPLANT

## 2016-09-08 NOTE — Op Note (Addendum)
    OPERATIVE NOTE   PROCEDURE: left second stage basilic vein transposition (brachiobasilic arteriovenous fistula) placement  PRE-OPERATIVE DIAGNOSIS: end stage renal disease-HD, s/p successful first stage brachial vein transposition   POST-OPERATIVE DIAGNOSIS: same as above   SURGEON: Adele Barthel, MD  ASSISTANT(S): Silva Bandy, PAC   ANESTHESIA: general  ESTIMATED BLOOD LOSS: 50 cc  FINDING(S): 1.  Fistula: >6 mm in proximal 4/5, 4 mm in distal 1/5 mm  2.  Easily palpable thrill in fistula at end of case  SPECIMEN(S):  none  INDICATIONS:   Allen Miller is a 60 y.o. male who presents with end stage renal disease-HD s/p L 1st stage BVT.  The patient is scheduled for left second stage basilic vein transposition.  The patient is aware the risks include but are not limited to: bleeding, infection, steal syndrome, nerve damage, ischemic monomelic neuropathy, failure to mature, and need for additional procedures.  The patient is aware of the risks of the procedure and elects to proceed forward.   DESCRIPTION: After full informed written consent was obtained from the patient, the patient was brought back to the operating room and placed supine upon the operating table.  Prior to induction, the patient received IV antibiotics.   After obtaining adequate anesthesia, the patient was then prepped and draped in the standard fashion for a left arm access procedure.  I turned my attention first to identifying the patient's brachiobasilic arteriovenous fistula.  Using SonoSite guidance, the location of this fistula was marked out on the skin.  I made an longitudinal incision over the fistula from its arterial anastomosis up to its axillary extent.  I carefully dissected the fistula away from its adjacent nerves, ligating side branches in the process.  Eventually the entirety of this fistula was mobilized.  At this point, I dissected a plane superficial to the bicipital fascia.  The fistula was  secured in this new location with interrupted 3-0 Vicryl stitches tied from side branches to the underlying fascia.  The deep subcutaneous tissue was inspected for bleeding.  Bleeding was controlled with electrocautery and placement of large pieces of Avitene.  I washed out the surgical site after waiting a few minutes, and there was no further bleeding.  I reapproximated the fascia with horizontal mattress stitches of 2-0 Vicryl.  The deep subcutaneous tissue was reapproximated with mattress sutures of 3-0 Vicryl to eliminate some of the dead space.  The superficial subcutaneous tissue was then reapproximated along the incision line with a running stitch of 3-0 Vicryl.  The skin was then reapproximated with a running subcuticular of 4-0 Monocryl.  The skin was then cleaned, dried, and reinforced with Dermabond.  The patient tolerated this procedure well.    COMPLICATIONS: none  CONDITION: stable   Adele Barthel, MD, [facility] [place] Office: [phone] Pager: [phone]  09/08/2016, 3:55 PM      .bc

## 2016-09-08 NOTE — Anesthesia Preprocedure Evaluation (Addendum)
Anesthesia Evaluation  Patient identified by MRN, date of birth, ID band Patient awake    Reviewed: Allergy & Precautions, NPO status , Patient's Chart, lab work & pertinent test results, reviewed documented beta blocker date and time   History of Anesthesia Complications Negative for: history of anesthetic complications  Airway Mallampati: II  TM Distance: >3 FB Neck ROM: Full    Dental  (+) Teeth Intact, Dental Advisory Given   Pulmonary    Pulmonary exam normal breath sounds clear to auscultation       Cardiovascular hypertension, Pt. on home beta blockers and Pt. on medications Normal cardiovascular exam Rhythm:Regular Rate:Normal     Neuro/Psych CVA, Residual Symptoms negative psych ROS   GI/Hepatic negative GI ROS, Neg liver ROS,   Endo/Other  Obesity   Renal/GU ESRF and DialysisRenal disease (MWF)K+ 4.2     Musculoskeletal negative musculoskeletal ROS (+)   Abdominal   Peds  Hematology  (+) Blood dyscrasia, anemia ,   Anesthesia Other Findings Day of surgery medications reviewed with the patient.  Reproductive/Obstetrics                            Anesthesia Physical Anesthesia Plan  ASA: III  Anesthesia Plan: General   Post-op Pain Management:    Induction: Intravenous  Airway Management Planned: LMA  Additional Equipment:   Intra-op Plan:   Post-operative Plan: Extubation in OR  Informed Consent: I have reviewed the patients History and Physical, chart, labs and discussed the procedure including the risks, benefits and alternatives for the proposed anesthesia with the patient or authorized representative who has indicated his/her understanding and acceptance.   Dental advisory given  Plan Discussed with: CRNA  Anesthesia Plan Comments: (Risks/benefits of general anesthesia discussed with patient including risk of damage to teeth, lips, gum, and tongue,  nausea/vomiting, allergic reactions to medications, and the possibility of heart attack, stroke and death.  All patient questions answered.  Patient wishes to proceed.)        Anesthesia Quick Evaluation

## 2016-09-08 NOTE — Transfer of Care (Signed)
Immediate Anesthesia Transfer of Care Note  Patient: Allen Miller  Procedure(s) Performed: Procedure(s): SECOND STAGE BASILIC VEIN TRANSPOSITION LEFT UPPER ARM (Left)  Patient Location: PACU  Anesthesia Type:General  Level of Consciousness: awake, alert , oriented and patient cooperative  Airway & Oxygen Therapy: Patient Spontanous Breathing and Patient connected to nasal cannula oxygen  Post-op Assessment: Report given to RN, Post -op Vital signs reviewed and stable and Patient moving all extremities  Post vital signs: Reviewed and stable  Last Vitals:  Vitals:   09/08/16 0806  BP: (!) 145/76  Pulse: 77  Resp: 16  Temp: 37 C    Last Pain:  Vitals:   09/08/16 0806  TempSrc: Oral         Complications: No apparent anesthesia complications

## 2016-09-08 NOTE — Progress Notes (Signed)
Dr Gifford Shave gave order to use dialysis cath for istat and IV access.

## 2016-09-08 NOTE — OR Nursing (Signed)
PATIENT BELONGINGS BROUGHT TO OR 11 BY Lara Mulch, CRNA. REMAINED WITH PATIENT CHART FOR TRANSFER TO PACU AT CASE END.

## 2016-09-08 NOTE — Discharge Instructions (Signed)
° ° °  09/08/2016 Allen Miller [id number] 05-04-1956  Surgeon(s): Conrad [place], MD  Procedure(s): SECOND STAGE BASILIC VEIN TRANSPOSITION LEFT UPPER ARM  x Do not stick graft for 8 weeks

## 2016-09-08 NOTE — H&P (View-Only) (Signed)
Postoperative Access Visit   History of Present Illness  Allen Miller is a 60 y.o. year old male who presents for postoperative follow-up for: L 1st BVT (Date: 07/14/16).  The patient's wounds are healed.  The patient notes no overt steal symptoms.  The patient is able to complete their activities of daily living.  The patient's current symptoms are: mild forearm numbness.  Past Medical History:  Diagnosis Date  . Hypertension   . Renal disorder   . Shortness of breath dyspnea    when lying flat  . Stroke [facility])    effective vision    Past Surgical History:  Procedure Laterality Date  . BASCILIC VEIN TRANSPOSITION Left 02/24/2016   Procedure: BASILIC VEIN TRANSPOSITION VERSUS ARTERIOVENOUS GRAFT INSERTION;  Surgeon: [name], MD;  Location: [place];  Service: Vascular;  Laterality: Left;  . BASCILIC VEIN TRANSPOSITION Left 07/14/2016   Procedure: LEFT BASILIC VEIN TRANSPOSITION;  Surgeon: Conrad Hubbell, MD;  Location: [place];  Service: Vascular;  Laterality: Left;  . INSERTION OF DIALYSIS CATHETER N/A 02/24/2016   Procedure: INSERTION OF DIALYSIS CATHETER;  Surgeon: [name], MD;  Location: [facility] OR;  Service: Vascular;  Laterality: N/A;    Social History   Social History  . Marital status: Single    Spouse name: N/A  . Number of children: N/A  . Years of education: N/A   Occupational History  . Not on file.   Social History Main Topics  . Smoking status: Never Smoker  . Smokeless tobacco: Never Used  . Alcohol use No  . Drug use: No  . Sexual activity: Not on file   Other Topics Concern  . Not on file   Social History Narrative  . No narrative on file    No family history on file.   Current Outpatient Prescriptions  Medication Sig Dispense Refill  . amLODipine (NORVASC) 10 MG tablet Take 1 tablet (10 mg total) by mouth daily. 30 tablet 0  . aspirin 81 MG chewable tablet Chew 1 tablet (81 mg total) by mouth daily. 30 tablet 5  . calcium acetate (PHOSLO)  667 MG capsule Take 667 mg by mouth 3 (three) times daily.  6  . carvedilol (COREG) 12.5 MG tablet Take 1 tablet (12.5 mg total) by mouth 2 (two) times daily with a meal. 60 tablet 0  . furosemide (LASIX) 80 MG tablet Take 1 tablet (80 mg total) by mouth 2 (two) times daily. 60 tablet 0  . hydrALAZINE (APRESOLINE) 100 MG tablet Take 0.5 tablets (50 mg total) by mouth 3 (three) times daily. 90 tablet 0  . losartan-hydrochlorothiazide (HYZAAR) 100-25 MG tablet TAKE 1 TAB BY MOUTH ONCE A DAY.  2  . oxyCODONE-acetaminophen (PERCOCET/ROXICET) 5-325 MG tablet Take 1 tablet by mouth every 6 (six) hours as needed. (Patient not taking: Reported on 08/21/2016) 6 tablet 0   No current facility-administered medications for this visit.      Allergies  Allergen Reactions  . No Known Allergies      REVIEW OF SYSTEMS:  (Positives checked otherwise negative)  CARDIOVASCULAR:   [ ]  chest pain,  [ ]  chest pressure,  [ ]  palpitations,  [ ]  shortness of breath when laying flat,  [ ]  shortness of breath with exertion,   [ ]  pain in feet when walking,  [ ]  pain in feet when laying flat, [ ]  history of blood clot in veins (DVT),  [ ]  history of phlebitis,  [ ]   swelling in legs,  [ ]  varicose veins  PULMONARY:   [ ]  productive cough,  [ ]  asthma,  [ ]  wheezing  NEUROLOGIC:   [ ]  weakness in arms or legs,  [ ]  numbness in arms or legs,  [ ]  difficulty speaking or slurred speech,  [ ]  temporary loss of vision in one eye,  [ ]  dizziness  HEMATOLOGIC:   [ ]  bleeding problems,  [ ]  problems with blood clotting too easily  MUSCULOSKEL:   [ ]  joint pain, [ ]  joint swelling  GASTROINTEST:   [ ]  vomiting blood,  [ ]  blood in stool     GENITOURINARY:   [ ]  burning with urination,  [ ]  blood in urine [x]  end stage renal disease-HD: M/W/F   PSYCHIATRIC:   [ ]  history of major depression  INTEGUMENTARY:   [ ]  rashes,  [ ]  ulcers  CONSTITUTIONAL:   [ ]  fever,  [ ]  chills   For VQI Use  Only  PRE-ADM LIVING: Home  AMB STATUS: Ambulatory  Physical Examination Vitals:   08/21/16 1244  BP: 122/75  Pulse: 85  Resp: (!) 21  Temp: 98.7 F (37.1 C)   Pulmonary: Sym exp, good air movt, CTAB, no rales, rhonchi, & wheezing  Cardiac: RRR, Nl S1, S2, no Murmurs, rubs or gallops  LUE: Incision is healed, skin feels warm, hand grip is 5/5, sensation in digits is intact, palpable thrill, bruit can be auscultated, palpable radial pulse, on sonosite: fistula > 6 mm throughout most of basilic vein   Medical Decision Making  Allen Miller is a 60 y.o. year old male who presents s/p L 1st BVT.   The patient's access is ready for transposition. Risk, benefits, and alternatives to access surgery were discussed.   The patient is aware the risks include but are not limited to: bleeding, infection, steal syndrome, nerve damage, ischemic monomelic neuropathy, failure to mature, need for additional procedures, death and stroke.  The patient is scheduled for Tuesday, 26 SEP 17. The patient agrees to proceed forward with the procedure.  Thank you for allowing [place] to participate in this patient's care.  Adele Barthel, MD, FACS [facility] [place] Office: [phone] Pager: [phone]

## 2016-09-08 NOTE — Interval H&P Note (Signed)
History and Physical Interval Note:  09/08/2016 1:29 PM  Allen Miller  has presented today for surgery, with the diagnosis of End Stage Renal Disease N18.6  The various methods of treatment have been discussed with the patient and family. After consideration of risks, benefits and other options for treatment, the patient has consented to  Procedure(s): SECOND STAGE BASILIC VEIN TRANSPOSITION (Left) as a surgical intervention .  The patient's history has been reviewed, patient examined, no change in status, stable for surgery.  I have reviewed the patient's chart and labs.  Questions were answered to the patient's satisfaction.     Adele Barthel

## 2016-09-08 NOTE — Anesthesia Procedure Notes (Signed)
Procedure Name: LMA Insertion Date/Time: 09/08/2016 2:13 PM Performed by: Myna Bright Pre-anesthesia Checklist: Patient identified, Emergency Drugs available, Suction available and Patient being monitored Patient Re-evaluated:Patient Re-evaluated prior to inductionOxygen Delivery Method: Circle system utilized Preoxygenation: Pre-oxygenation with 100% oxygen Intubation Type: IV induction Ventilation: Mask ventilation without difficulty LMA: LMA inserted LMA Size: 5.0 Tube type: Oral Number of attempts: 1

## 2016-09-09 ENCOUNTER — Encounter (HOSPITAL_COMMUNITY): Payer: Self-pay | Admitting: Vascular Surgery

## 2016-09-09 ENCOUNTER — Telehealth: Payer: Self-pay | Admitting: Vascular Surgery

## 2016-09-09 NOTE — Telephone Encounter (Signed)
-----   Message from Denman George, RN sent at 09/08/2016  4:06 PM EDT ----- Regarding: needs 4 week f/u with Dr. Bridgett Larsson; no vasc. studies   ----- Message ----- From: [name], PA-C Sent: 09/08/2016   3:57 PM To: Vvs Charge Pool  S/p left 2nd stage BVT 09/08/16  F/u with Dr. Bridgett Larsson in 4 weeks. No studies.   Thanks Maudie Mercury

## 2016-09-09 NOTE — Telephone Encounter (Signed)
Pt unable to write appt down due to driving, req letter to be mailed about appt on 10/09/16

## 2016-09-10 NOTE — Anesthesia Postprocedure Evaluation (Signed)
Anesthesia Post Note  Patient: Allen Miller  Procedure(s) Performed: Procedure(s) (LRB): SECOND STAGE BASILIC VEIN TRANSPOSITION LEFT UPPER ARM (Left)  Patient location during evaluation: PACU Anesthesia Type: General Level of consciousness: awake and alert Pain management: pain level controlled Vital Signs Assessment: post-procedure vital signs reviewed and stable Respiratory status: spontaneous breathing, nonlabored ventilation, respiratory function stable and patient connected to nasal cannula oxygen Cardiovascular status: blood pressure returned to baseline and stable Postop Assessment: no signs of nausea or vomiting Anesthetic complications: no    Last Vitals:  Vitals:   09/08/16 1730 09/08/16 1745  BP:  133/73  Pulse: 71 72  Resp: 18 14  Temp:  36.3 C    Last Pain:  Vitals:   09/08/16 1745  TempSrc:   PainSc: 0-No pain                 Catalina Gravel

## 2016-10-09 ENCOUNTER — Ambulatory Visit: Payer: Medicare Other | Admitting: Vascular Surgery

## 2016-10-15 ENCOUNTER — Encounter: Payer: Self-pay | Admitting: Vascular Surgery

## 2016-10-15 NOTE — Progress Notes (Signed)
Postoperative Access Visit   History of Present Illness  Marsean Elkhatib is a 60 y.o. year old male who presents for postoperative follow-up for: L 2nd BVT (Date: 09/08/16).  The patient's wounds are healed.  The patient notes no steal symptoms.  The patient is able to complete their activities of daily living.  The patient's current symptoms are: no thrill in fistula.  Pt's HD unit noticed this on Monday.   Past Medical History:  Diagnosis Date  . ESRD (end stage renal disease) ([place])    MWF  . Hypertension   . Shortness of breath dyspnea    when lying flat  . Stroke [facility])    effective vision- Left eye    Past Surgical History:  Procedure Laterality Date  . BASCILIC VEIN TRANSPOSITION Left 02/24/2016   Procedure: BASILIC VEIN TRANSPOSITION VERSUS ARTERIOVENOUS GRAFT INSERTION;  Surgeon: [name], MD;  Location: [place];  Service: Vascular;  Laterality: Left;  . BASCILIC VEIN TRANSPOSITION Left 07/14/2016   Procedure: LEFT BASILIC VEIN TRANSPOSITION;  Surgeon: Conrad Jette, MD;  Location: [place];  Service: Vascular;  Laterality: Left;  . BASCILIC VEIN TRANSPOSITION Left 09/08/2016   Procedure: SECOND STAGE BASILIC VEIN TRANSPOSITION LEFT UPPER ARM;  Surgeon: Conrad Rio Pinar, MD;  Location: [place];  Service: Vascular;  Laterality: Left;  . INSERTION OF DIALYSIS CATHETER N/A 02/24/2016   Procedure: INSERTION OF DIALYSIS CATHETER;  Surgeon: [name], MD;  Location: [facility] OR;  Service: Vascular;  Laterality: N/A;    Social History   Social History  . Marital status: Single    Spouse name: N/A  . Number of children: N/A  . Years of education: N/A   Occupational History  . Not on file.   Social History Main Topics  . Smoking status: Never Smoker  . Smokeless tobacco: Never Used  . Alcohol use No  . Drug use: No  . Sexual activity: Not on file   Other Topics Concern  . Not on file   Social History Narrative  . No narrative on file    No family history on  file.   Current Outpatient Prescriptions  Medication Sig Dispense Refill  . amLODipine (NORVASC) 10 MG tablet Take 1 tablet (10 mg total) by mouth daily. 30 tablet 0  . aspirin 81 MG chewable tablet Chew 1 tablet (81 mg total) by mouth daily. 30 tablet 5  . B Complex Vitamins (B-COMPLEX/B-12) LIQD Place 2 sprays under the tongue daily.    . calcium acetate (PHOSLO) 667 MG capsule Take 667 mg by mouth 3 (three) times daily.  6  . carvedilol (COREG) 12.5 MG tablet Take 1 tablet (12.5 mg total) by mouth 2 (two) times daily with a meal. 60 tablet 0  . furosemide (LASIX) 80 MG tablet Take 1 tablet (80 mg total) by mouth 2 (two) times daily. 60 tablet 0  . hydrALAZINE (APRESOLINE) 100 MG tablet Take 0.5 tablets (50 mg total) by mouth 3 (three) times daily. 90 tablet 0  . losartan-hydrochlorothiazide (HYZAAR) 100-25 MG tablet TAKE 1 TAB BY MOUTH ONCE A DAY.  2  . oxyCODONE-acetaminophen (PERCOCET/ROXICET) 5-325 MG tablet Take 1 tablet by mouth every 6 (six) hours as needed. 15 tablet 0   No current facility-administered medications for this visit.      Allergies  Allergen Reactions  . No Known Allergies     REVIEW OF SYSTEMS:  (Positives checked otherwise negative)  CARDIOVASCULAR:   [ ]  chest pain,  [ ]   chest pressure,  [ ]  palpitations,  [ ]  shortness of breath when laying flat,  [ ]  shortness of breath with exertion,   [ ]  pain in feet when walking,  [ ]  pain in feet when laying flat, [ ]  history of blood clot in veins (DVT),  [ ]  history of phlebitis,  [ ]  swelling in legs,  [ ]  varicose veins  PULMONARY:   [ ]  productive cough,  [ ]  asthma,  [ ]  wheezing  NEUROLOGIC:   [ ]  weakness in arms or legs,  [ ]  numbness in arms or legs,  [ ]  difficulty speaking or slurred speech,  [ ]  temporary loss of vision in one eye,  [ ]  dizziness  HEMATOLOGIC:   [ ]  bleeding problems,  [ ]  problems with blood clotting too easily  MUSCULOSKEL:   [ ]  joint pain, [ ]  joint  swelling  GASTROINTEST:   [ ]  vomiting blood,  [ ]  blood in stool     GENITOURINARY:   [ ]  burning with urination,  [ ]  blood in urine [x]  end stage renal disease-HD: M/W/F   PSYCHIATRIC:   [ ]  history of major depression  INTEGUMENTARY:   [ ]  rashes,  [ ]  ulcers  CONSTITUTIONAL:   [ ]  fever,  [ ]  chills   Physical Examination Vitals:   10/16/16 1056  BP: 125/76  Pulse: 90  Resp: 20  Temp: 98.2 F (36.8 C)   Pulmonary: Sym exp, good air movt, CTAB, no rales, rhonchi, & wheezing  Cardiac: RRR, Nl S1, S2, no Murmurs, rubs or gallops  LUE: Incision is healed, skin feels warm, hand grip is 5/5, sensation in digits is intact, no palpable thrill, no bruit can  be auscultated    Medical Decision Making  Dameer Speiser is a 60 y.o. year old male who presents s/p thrombosed L 2nd BVT.   Unclear why this patient's fistula thrombosed.  Will bring the patient back to the OR for thrombectomy of the basilic vein transposition.    Pt is a bit unclear of the timing of the occlusion or I would proceed with percutaneous thrombectomy.  If this fistula cannot be salvaged, I would immediately place a LUA AVG, Risk, benefits, and alternatives to access surgery were discussed.   The patient is aware the risks include but are not limited to: bleeding, infection, steal syndrome, nerve damage, ischemic monomelic neuropathy, failure to mature, need for additional procedures, death and stroke.  The patient agrees to proceed forward with the procedure.  Thank you for allowing [place] to participate in this patient's care.   Adele Barthel, MD, FACS [facility] [place] Office: [phone] Pager: [phone]

## 2016-10-16 ENCOUNTER — Ambulatory Visit (INDEPENDENT_AMBULATORY_CARE_PROVIDER_SITE_OTHER): Payer: Medicare Other | Admitting: Vascular Surgery

## 2016-10-16 ENCOUNTER — Encounter: Payer: Self-pay | Admitting: Vascular Surgery

## 2016-10-16 VITALS — BP 125/76 | HR 90 | Temp 98.2°F | Resp 20 | Ht 65.0 in | Wt 180.0 lb

## 2016-10-16 DIAGNOSIS — Z992 Dependence on renal dialysis: Secondary | ICD-10-CM

## 2016-10-16 DIAGNOSIS — N186 End stage renal disease: Secondary | ICD-10-CM

## 2016-10-16 NOTE — Progress Notes (Signed)
7:30 A

## 2016-10-19 ENCOUNTER — Other Ambulatory Visit: Payer: Self-pay

## 2016-10-23 ENCOUNTER — Encounter (HOSPITAL_COMMUNITY): Payer: Self-pay | Admitting: *Deleted

## 2016-10-23 NOTE — Progress Notes (Signed)
Spoke with pt for pre-op call. Pt denies cardiac history, chest pain or sob. Pt is not a diabetic. Pt states at this time he does not have anyone to pick him up from surgery and be with him for the 24 hours after surgery. He states he is still working to find someone.

## 2016-10-27 ENCOUNTER — Ambulatory Visit (HOSPITAL_COMMUNITY): Payer: BLUE CROSS/BLUE SHIELD

## 2016-10-27 ENCOUNTER — Encounter (HOSPITAL_COMMUNITY)
Admission: RE | Disposition: A | Discharge status: Home / Self Care | Payer: Self-pay | Source: Ambulatory Visit | Attending: Vascular Surgery

## 2016-10-27 ENCOUNTER — Encounter (HOSPITAL_COMMUNITY): Payer: Self-pay | Admitting: Anesthesiology

## 2016-10-27 ENCOUNTER — Ambulatory Visit (HOSPITAL_COMMUNITY)
Admission: RE | Admit: 2016-10-27 | Discharge: 2016-10-27 | Disposition: A | Discharge status: Home / Self Care | Payer: BLUE CROSS/BLUE SHIELD | Source: Ambulatory Visit | Attending: Vascular Surgery | Admitting: Vascular Surgery

## 2016-10-27 ENCOUNTER — Ambulatory Visit (HOSPITAL_COMMUNITY): Payer: BLUE CROSS/BLUE SHIELD | Admitting: Anesthesiology

## 2016-10-27 DIAGNOSIS — Z8673 Personal history of transient ischemic attack (TIA), and cerebral infarction without residual deficits: Secondary | ICD-10-CM | POA: Insufficient documentation

## 2016-10-27 DIAGNOSIS — I120 Hypertensive chronic kidney disease with stage 5 chronic kidney disease or end stage renal disease: Secondary | ICD-10-CM | POA: Diagnosis not present

## 2016-10-27 DIAGNOSIS — T82898A Other specified complication of vascular prosthetic devices, implants and grafts, initial encounter: Secondary | ICD-10-CM | POA: Diagnosis not present

## 2016-10-27 DIAGNOSIS — N186 End stage renal disease: Secondary | ICD-10-CM | POA: Insufficient documentation

## 2016-10-27 DIAGNOSIS — Z4901 Encounter for fitting and adjustment of extracorporeal dialysis catheter: Secondary | ICD-10-CM

## 2016-10-27 DIAGNOSIS — Y832 Surgical operation with anastomosis, bypass or graft as the cause of abnormal reaction of the patient, or of later complication, without mention of misadventure at the time of the procedure: Secondary | ICD-10-CM | POA: Insufficient documentation

## 2016-10-27 DIAGNOSIS — Z79899 Other long term (current) drug therapy: Secondary | ICD-10-CM | POA: Diagnosis not present

## 2016-10-27 DIAGNOSIS — Z7982 Long term (current) use of aspirin: Secondary | ICD-10-CM | POA: Insufficient documentation

## 2016-10-27 DIAGNOSIS — T82868A Thrombosis of vascular prosthetic devices, implants and grafts, initial encounter: Secondary | ICD-10-CM | POA: Diagnosis not present

## 2016-10-27 DIAGNOSIS — Z992 Dependence on renal dialysis: Secondary | ICD-10-CM | POA: Insufficient documentation

## 2016-10-27 DIAGNOSIS — Z419 Encounter for procedure for purposes other than remedying health state, unspecified: Secondary | ICD-10-CM

## 2016-10-27 HISTORY — PX: AV FISTULA PLACEMENT: SHX1204

## 2016-10-27 HISTORY — DX: Iron deficiency: E61.1

## 2016-10-27 HISTORY — DX: Malignant (primary) neoplasm, unspecified: C80.1

## 2016-10-27 HISTORY — PX: INSERTION OF DIALYSIS CATHETER: SHX1324

## 2016-10-27 LAB — POCT I-STAT 4, (NA,K, GLUC, HGB,HCT)
GLUCOSE: 89 mg/dL (ref 65–99)
HCT: 36 % — ABNORMAL LOW (ref 39.0–52.0)
HEMOGLOBIN: 12.2 g/dL — AB (ref 13.0–17.0)
POTASSIUM: 4 mmol/L (ref 3.5–5.1)
Sodium: 139 mmol/L (ref 135–145)

## 2016-10-27 SURGERY — INSERTION OF ARTERIOVENOUS (AV) GORE-TEX GRAFT ARM
Anesthesia: General | Site: Neck | Laterality: Right

## 2016-10-27 MED ORDER — FENTANYL CITRATE (PF) 100 MCG/2ML IJ SOLN
INTRAMUSCULAR | Status: AC
  Filled 2016-10-27: qty 2

## 2016-10-27 MED ORDER — SODIUM CHLORIDE 0.9 % IV SOLN
INTRAVENOUS | Status: DC
  Administered 2016-10-27 (×2): via INTRAVENOUS

## 2016-10-27 MED ORDER — CARVEDILOL 12.5 MG PO TABS
ORAL_TABLET | ORAL | Status: AC
  Administered 2016-10-27: 12.5 mg
  Filled 2016-10-27: qty 1

## 2016-10-27 MED ORDER — PHENYLEPHRINE HCL 10 MG/ML IJ SOLN
INTRAMUSCULAR | Status: DC | PRN
  Administered 2016-10-27: 40 ug via INTRAVENOUS
  Administered 2016-10-27 (×2): 80 ug via INTRAVENOUS
  Administered 2016-10-27: 120 ug via INTRAVENOUS
  Administered 2016-10-27: 80 ug via INTRAVENOUS
  Administered 2016-10-27 (×2): 40 ug via INTRAVENOUS

## 2016-10-27 MED ORDER — HEMOSTATIC AGENTS (NO CHARGE) OPTIME
TOPICAL | Status: DC | PRN
  Administered 2016-10-27: 1 via TOPICAL

## 2016-10-27 MED ORDER — DEXTROSE 5 % IV SOLN
INTRAVENOUS | Status: AC
  Filled 2016-10-27: qty 1.5

## 2016-10-27 MED ORDER — DEXTROSE 5 % IV SOLN
1.5000 g | INTRAVENOUS | Status: AC
  Administered 2016-10-27: 1.5 g via INTRAVENOUS

## 2016-10-27 MED ORDER — ONDANSETRON HCL 4 MG/2ML IJ SOLN
INTRAMUSCULAR | Status: AC
  Filled 2016-10-27: qty 2

## 2016-10-27 MED ORDER — ONDANSETRON HCL 4 MG/2ML IJ SOLN
INTRAMUSCULAR | Status: DC | PRN
  Administered 2016-10-27: 4 mg via INTRAVENOUS

## 2016-10-27 MED ORDER — ONDANSETRON HCL 4 MG/2ML IJ SOLN: 4.0000 mg | Freq: Once | INTRAMUSCULAR | Status: DC | PRN

## 2016-10-27 MED ORDER — CHLORHEXIDINE GLUCONATE CLOTH 2 % EX PADS: 6.0000 | MEDICATED_PAD | Freq: Once | CUTANEOUS | Status: DC

## 2016-10-27 MED ORDER — EPHEDRINE 5 MG/ML INJ
INTRAVENOUS | Status: AC
  Filled 2016-10-27: qty 10

## 2016-10-27 MED ORDER — PHENYLEPHRINE 40 MCG/ML (10ML) SYRINGE FOR IV PUSH (FOR BLOOD PRESSURE SUPPORT)
PREFILLED_SYRINGE | INTRAVENOUS | Status: AC
  Filled 2016-10-27: qty 10

## 2016-10-27 MED ORDER — HEPARIN SODIUM (PORCINE) 1000 UNIT/ML IJ SOLN
INTRAMUSCULAR | Status: AC
  Filled 2016-10-27: qty 1

## 2016-10-27 MED ORDER — MIDAZOLAM HCL 5 MG/5ML IJ SOLN
INTRAMUSCULAR | Status: DC | PRN
  Administered 2016-10-27: 2 mg via INTRAVENOUS

## 2016-10-27 MED ORDER — PROTAMINE SULFATE 10 MG/ML IV SOLN
INTRAVENOUS | Status: AC
  Filled 2016-10-27: qty 5

## 2016-10-27 MED ORDER — PROPOFOL 10 MG/ML IV BOLUS
INTRAVENOUS | Status: DC | PRN
  Administered 2016-10-27: 130 mg via INTRAVENOUS

## 2016-10-27 MED ORDER — EPHEDRINE SULFATE 50 MG/ML IJ SOLN
INTRAMUSCULAR | Status: DC | PRN
  Administered 2016-10-27: 5 mg via INTRAVENOUS
  Administered 2016-10-27: 10 mg via INTRAVENOUS
  Administered 2016-10-27: 5 mg via INTRAVENOUS

## 2016-10-27 MED ORDER — MEPERIDINE HCL 25 MG/ML IJ SOLN: 6.2500 mg | INTRAMUSCULAR | Status: DC | PRN

## 2016-10-27 MED ORDER — MIDAZOLAM HCL 2 MG/2ML IJ SOLN
INTRAMUSCULAR | Status: AC
  Filled 2016-10-27: qty 2

## 2016-10-27 MED ORDER — OXYCODONE-ACETAMINOPHEN 5-325 MG PO TABS: 1.0000 | ORAL_TABLET | Freq: Four times a day (QID) | ORAL | 0 refills | Status: DC | PRN

## 2016-10-27 MED ORDER — HEPARIN SODIUM (PORCINE) 1000 UNIT/ML IJ SOLN
INTRAMUSCULAR | Status: DC | PRN
  Administered 2016-10-27: 8000 [IU] via INTRAVENOUS

## 2016-10-27 MED ORDER — IOPAMIDOL (ISOVUE-300) INJECTION 61%
INTRAVENOUS | Status: AC
  Filled 2016-10-27: qty 50

## 2016-10-27 MED ORDER — 0.9 % SODIUM CHLORIDE (POUR BTL) OPTIME
TOPICAL | Status: DC | PRN
  Administered 2016-10-27: 1000 mL

## 2016-10-27 MED ORDER — SODIUM CHLORIDE 0.9 % IV SOLN
INTRAVENOUS | Status: DC | PRN
  Administered 2016-10-27: 500 mL

## 2016-10-27 MED ORDER — LIDOCAINE 2% (20 MG/ML) 5 ML SYRINGE
INTRAMUSCULAR | Status: AC
  Filled 2016-10-27: qty 5

## 2016-10-27 MED ORDER — FENTANYL CITRATE (PF) 100 MCG/2ML IJ SOLN
INTRAMUSCULAR | Status: DC | PRN
  Administered 2016-10-27: 50 ug via INTRAVENOUS
  Administered 2016-10-27: 100 ug via INTRAVENOUS
  Administered 2016-10-27: 50 ug via INTRAVENOUS

## 2016-10-27 MED ORDER — HEPARIN SODIUM (PORCINE) 1000 UNIT/ML IJ SOLN
INTRAMUSCULAR | Status: DC | PRN
  Administered 2016-10-27: 3 [IU]

## 2016-10-27 MED ORDER — HYDROMORPHONE HCL 1 MG/ML IJ SOLN: 0.2500 mg | INTRAMUSCULAR | Status: DC | PRN

## 2016-10-27 MED ORDER — LIDOCAINE HCL (PF) 1 % IJ SOLN
INTRAMUSCULAR | Status: AC
  Filled 2016-10-27: qty 30

## 2016-10-27 MED ORDER — LIDOCAINE HCL (CARDIAC) 20 MG/ML IV SOLN
INTRAVENOUS | Status: DC | PRN
  Administered 2016-10-27: 100 mg via INTRAVENOUS

## 2016-10-27 MED ORDER — PROTAMINE SULFATE 10 MG/ML IV SOLN
INTRAVENOUS | Status: DC | PRN
  Administered 2016-10-27 (×4): 10 mg via INTRAVENOUS

## 2016-10-27 SURGICAL SUPPLY — 57 items
ARMBAND PINK RESTRICT EXTREMIT (MISCELLANEOUS) ×8 IMPLANT
CANISTER SUCTION 2500CC (MISCELLANEOUS) ×4 IMPLANT
CATH EMB 4FR 80CM (CATHETERS) ×4 IMPLANT
CATH PALINDROME RT-P 15FX19CM (CATHETERS) ×4 IMPLANT
CATH PALINDROME RT-P 15FX23CM (CATHETERS) ×4 IMPLANT
CATH SOFT-VU 4F 65 STRAIGHT (CATHETERS) ×2 IMPLANT
CATH SOFT-VU STRAIGHT 4F 65CM (CATHETERS) ×2
CATH STRAIGHT 5FR 65CM (CATHETERS) ×4 IMPLANT
CLIP TI MEDIUM 6 (CLIP) ×4 IMPLANT
CLIP TI WIDE RED SMALL 6 (CLIP) ×4 IMPLANT
COVER PROBE W GEL 5X96 (DRAPES) ×4 IMPLANT
DECANTER SPIKE VIAL GLASS SM (MISCELLANEOUS) ×4 IMPLANT
DERMABOND ADHESIVE PROPEN (GAUZE/BANDAGES/DRESSINGS) ×2
DERMABOND ADVANCED (GAUZE/BANDAGES/DRESSINGS) ×2
DERMABOND ADVANCED .7 DNX12 (GAUZE/BANDAGES/DRESSINGS) ×2 IMPLANT
DERMABOND ADVANCED .7 DNX6 (GAUZE/BANDAGES/DRESSINGS) ×2 IMPLANT
DRAPE LAPAROSCOPIC ABDOMINAL (DRAPES) ×4 IMPLANT
DRAPE X-RAY CASS 24X20 (DRAPES) IMPLANT
ELECT REM PT RETURN 9FT ADLT (ELECTROSURGICAL) ×4
ELECTRODE REM PT RTRN 9FT ADLT (ELECTROSURGICAL) ×2 IMPLANT
GAUZE SPONGE 4X4 16PLY XRAY LF (GAUZE/BANDAGES/DRESSINGS) IMPLANT
GEL ULTRASOUND 20GR AQUASONIC (MISCELLANEOUS) IMPLANT
GLOVE BIO SURGEON STRL SZ7 (GLOVE) ×4 IMPLANT
GLOVE BIOGEL PI IND STRL 7.0 (GLOVE) ×2 IMPLANT
GLOVE BIOGEL PI IND STRL 7.5 (GLOVE) ×2 IMPLANT
GLOVE BIOGEL PI INDICATOR 7.0 (GLOVE) ×2
GLOVE BIOGEL PI INDICATOR 7.5 (GLOVE) ×2
GLOVE SURG SS PI 6.5 STRL IVOR (GLOVE) ×4 IMPLANT
GOWN STRL REUS W/ TWL LRG LVL3 (GOWN DISPOSABLE) ×8 IMPLANT
GOWN STRL REUS W/TWL LRG LVL3 (GOWN DISPOSABLE) ×8
GRAFT GORETEX STND 4X7 (Vascular Products) ×4 IMPLANT
GRAFT GORETEXSTD 4X7 (Vascular Products) ×2 IMPLANT
HEMOSTAT SPONGE AVITENE ULTRA (HEMOSTASIS) ×4 IMPLANT
INSERT FOGARTY SM (MISCELLANEOUS) ×4 IMPLANT
KIT BASIN OR (CUSTOM PROCEDURE TRAY) ×4 IMPLANT
KIT ROOM TURNOVER OR (KITS) ×4 IMPLANT
NEEDLE 18GX1X1/2 (RX/OR ONLY) (NEEDLE) ×4 IMPLANT
NS IRRIG 1000ML POUR BTL (IV SOLUTION) ×4 IMPLANT
PACK CV ACCESS (CUSTOM PROCEDURE TRAY) ×4 IMPLANT
PAD ARMBOARD 7.5X6 YLW CONV (MISCELLANEOUS) ×8 IMPLANT
SET COLLECT BLD 21X3/4 12 (NEEDLE) IMPLANT
STOPCOCK 4 WAY LG BORE MALE ST (IV SETS) IMPLANT
SUT ETHILON 3 0 PS 1 (SUTURE) ×4 IMPLANT
SUT MNCRL AB 4-0 PS2 18 (SUTURE) ×8 IMPLANT
SUT PROLENE 5 0 C 1 24 (SUTURE) IMPLANT
SUT PROLENE 6 0 BV (SUTURE) ×12 IMPLANT
SUT PROLENE 7 0 BV 1 (SUTURE) ×8 IMPLANT
SUT SILK 2 0 FS (SUTURE) ×4 IMPLANT
SUT VIC AB 3-0 SH 27 (SUTURE) ×6
SUT VIC AB 3-0 SH 27X BRD (SUTURE) ×6 IMPLANT
SYR CONTROL 10ML LL (SYRINGE) ×4 IMPLANT
SYRINGE 3CC LL L/F (MISCELLANEOUS) ×4 IMPLANT
TUBING EXTENTION W/L.L. (IV SETS) IMPLANT
UNDERPAD 30X30 (UNDERPADS AND DIAPERS) ×4 IMPLANT
WATER STERILE IRR 1000ML POUR (IV SOLUTION) ×4 IMPLANT
WIRE AMPLATZ SS-J .035X180CM (WIRE) ×4 IMPLANT
WIRE BENTSON .035X145CM (WIRE) ×4 IMPLANT

## 2016-10-27 NOTE — Anesthesia Preprocedure Evaluation (Addendum)
Anesthesia Evaluation  Patient identified by MRN, date of birth, ID band Patient awake    Reviewed: Allergy & Precautions, NPO status , Patient's Chart, lab work & pertinent test results, reviewed documented beta blocker date and time   Airway Mallampati: I  TM Distance: >3 FB Neck ROM: Full    Dental  (+) Dental Advisory Given   Pulmonary    Pulmonary exam normal breath sounds clear to auscultation       Cardiovascular hypertension, Pt. on medications and Pt. on home beta blockers Normal cardiovascular exam Rhythm:Regular Rate:Normal     Neuro/Psych CVA    GI/Hepatic   Endo/Other    Renal/GU ESRF and DialysisRenal disease     Musculoskeletal   Abdominal   Peds  Hematology   Anesthesia Other Findings   Reproductive/Obstetrics                            Anesthesia Physical Anesthesia Plan  ASA: III  Anesthesia Plan: General   Post-op Pain Management:    Induction: Intravenous  Airway Management Planned: LMA  Additional Equipment:   Intra-op Plan:   Post-operative Plan: Extubation in OR  Informed Consent: I have reviewed the patients History and Physical, chart, labs and discussed the procedure including the risks, benefits and alternatives for the proposed anesthesia with the patient or authorized representative who has indicated his/her understanding and acceptance.     Plan Discussed with: CRNA and Surgeon  Anesthesia Plan Comments:         Anesthesia Quick Evaluation

## 2016-10-27 NOTE — Transfer of Care (Signed)
Immediate Anesthesia Transfer of Care Note  Patient: Allen Miller  Procedure(s) Performed: Procedure(s): INSERTION LEFT UPPER ARM  OF ARTERIOVENOUS (AV) GORE-TEX GRAFT ARM (Left) EXCHANGE OF RIGHT INTERNAL JUGULAR DIALYSIS CATHETER (Right)  Patient Location: PACU  Anesthesia Type:General  Level of Consciousness: patient cooperative and responds to stimulation  Airway & Oxygen Therapy: Patient Spontanous Breathing and Patient connected to face mask oxygen  Post-op Assessment: Report given to RN and Post -op Vital signs reviewed and stable  Post vital signs: Reviewed and stable  Last Vitals:  Vitals:   10/27/16 0837 10/27/16 1236  BP: (!) 169/74 (!) 152/77  Pulse: 66 67  Resp: 18 14  Temp: 36.9 C 36.3 C    Last Pain:  Vitals:   10/27/16 0837  TempSrc: Oral      Patients Stated Pain Goal: 3 (69/62/95 2841)  Complications: No apparent anesthesia complications

## 2016-10-27 NOTE — Op Note (Signed)
OPERATIVE NOTE   PROCEDURE: 1. Exchange of right internal jugular vein tunneled dialysis catheter  2. Placement of left upper arm arteriovenous graft (brachiobasilic arteriovenous graft, 4,7 stretch Goretex)  PRE-OPERATIVE DIAGNOSIS:  end stage renal disease, thrombosed left arm fistula and tunneled dialysis catheter   POST-OPERATIVE DIAGNOSIS:  same as above   SURGEON: Adele Barthel, MD  ASSISTANT(S): Leontine Locket, PAC   ANESTHESIA: general  ESTIMATED BLOOD LOSS: 50 cc  FINDING(S): 1.  Tunneled dialysis catheter tip in right atrium 2.  No obvious pneumothorax on fluoroscopy 3.  Sclerotic and occluded distal fistula 4.  Weak pulse in larger artery in antecubitum 5.  High brachial artery bifurcation at level of axilla vs duplicated brachial artery  SPECIMEN(S):  none  INDICATIONS:   Allen Miller is a 60 y.o. male who presents with thrombosed left arm fistula and tunneled dialysis catheter.  I recommended: exchange of right tunneled dialysis catheter and attempted thrombectomy of left arm fistula vs placement of upper arm arteriovenous graft.  Risk, benefits, and alternatives to access surgery were discussed.  The patient is aware the risks include but are not limited to: bleeding, infection, steal syndrome, nerve damage, ischemic monomelic neuropathy, failure to mature, need for additional procedures, death and stroke.  The patient is aware the risks of tunneled dialysis catheter placement include but are not limited to: bleeding, infection, central venous injury, pneumothorax, possible venous stenosis, possible malpositioning in the venous system, and possible infections related to long-term catheter presence. The patient was aware of these risks and agreed to proceed.   DESCRIPTION: After obtaining full informed written consent, the patient was brought back to the operating room and placed supine upon the operating table.  The patient received IV antibiotics prior to  induction.  After obtaining adequate anesthesia, the patient was prepped and draped in the standard fashion for: right tunneled dialysis catheter exchange.  I bluntly dissected out the cuff of the prior tunneled dialysis catheter.  I transected backend of the tunneled dialysis catheter and pushed the backend of the catheter out of the field.  I clamped one lumen with and then loaded a J-wire through the open port.  Under fluoroscopy, I steered the wire into the inferior vena cava.  The remaining catheter was removed of the wire.  I loaded a straight catheter over the wire into the inferior vena cava.  An Amplatz wire was placed through the catheter under fluroscopic guidance.  I loaded first a 23 cm Palindrome catheter over the wire.  The catheter appeared to be too long so I exchanged it for a 19 cm Palindrome catheter over the wire.  The wire was removed.  It appeared to be in the right atrium.  I clamped the catheter and then transected the backend of the new catheter.  The catheter hub was loaded and then the two ports.  I clicked the hub into place and then tested each lumen.  There was no resistance.  Both ports were loaded with heparinized saline.  I secured the catheter by tying two 3-0 Nylon sutures to the catheter.  Each port was then loaded with concentrated heparin at the maufacturer recommended volumes.  Sterile dressings were applied.  At this point the drapes were taken down and the bed repositioned. The patient was reprepped and redraped for a Left arm access procedure.  I interrogated the left arm.  Several findings were evident: likely high brachial artery bifurcation at level of axilla and patent basilic vein in axilla.  I made an incision over the distal fistula and dissected out the brachial artery and distal fistula.  The distal fistula was found to be as hard as a piece of electrical cable, consistent with chronic occlusion of the distal fistula.  I felt there was inadequate length of  fistula to salvage this fistula, so I elected to place a left upper arm arteriovenous graft. The artery in the axilla looked no bigger than the artery in the antecubitum.  I dissected out an adequate length to place vessel loops.  I then packed this incision with a wet raytec.    I then made an axillary incision through the prior axillary scar.  I dissected out the basilic vein in the axilla.  The vein was soft to touch so I elected to using this vein as the venous outflow.  The patient was given 8000 units of Heparin intravenously, which was a therapeutic bolus.   After waiting 2 minutes, I tied off the distal basilic vein in this incision and then made a nick in the vein.  I passed the 4 Fogarty proximally, getting excellent backbleeding and no thrombus.  I clamped the basilic vein vein proximally.  I tunneled from the antecubital incision to the axillary incision with a curvilinear tunneler.  I obtained a 4,7 mm stretch Goretex graft and pulled it to length.  I passed the graft through the tunneler.  I pulled out the tunnel, leaving the 4 mm end of the graft toward the antecubitum and the 7 mm toward the axillary.   I put the left presumed brachial artery under tension proximally and distally.  I made an arteriotomy and extended it with a Potts scissor.  I slightly spatulated the 4 mm end of the graft.  I sewed the graft to the artery with a running stitch of 7-0 Prolene in an end-to-side configuration.  I backbled both end of this artery prior to completing this anastomosis.  The bleeding was not very pulsatile, consistent with the preoperative status of this artery.  I pulled the graft to tension in the tunnel and clamped the graft just proximal to the arterial anastomosis.    I turned my attention to the axilla.  I spatulated the axillary vein to meet the dimension of the 7 mm end of the graft.  I spatulated the graft to facilitate an end-to-end anastomosis.  The graft was sewn to the vein with a running  stitch of 6-0 Prolene.  Prior to completing this anastomosis, I backbled the vein: excellent backbleeding.  I allowed the graft to bleeding in an antegrade fashion: non-pulsatile continuous bleeding.  I completed this anastomosis in the usual fashion.  I released all clamps and then gave the patient 40 mg of Protamine to reverse anticoagulation.  I packed each incision with Avitene.  After waiting a few minutes, bleeding slowed but there continued to be bleeding in each suture lines.  I pulled up the suture line on both anastomoses and repaired the laxity in the usual fashion with 6-0 and 7-0 Prolene stitches appropriately.  At this point, there was no further active bleeding.  I repacked each incision with Avitene.  After waiting a few minutes, there was no further active bleeding.    On continuous dopplerable: there was biphasic signals in the presumed brachial artery proximal and distal to the anastomosis.  Distally there was a dopplerable monophasic radial signal.  Proximally there was basilic vein signal consistent with widely patent arteriovenous graft.  By the end  of the case, there was faintly palpable thrill in the graft.  Each incision was repaired with a layer of 3-0 Vicryl then a running subcuticular stitch of 4-0 Vicryl.  The skin was cleaned, dried, and reinforced with Dermabond.   COMPLICATIONS: none  CONDITION: stable   Adele Barthel, MD [facility] [place] Office: [phone] Pager: [phone]  10/27/2016, 10:26 AM

## 2016-10-27 NOTE — Anesthesia Procedure Notes (Signed)
Procedure Name: LMA Insertion Date/Time: 10/27/2016 9:47 AM Performed by: Jenne Campus Pre-anesthesia Checklist: Patient identified, Emergency Drugs available, Suction available and Patient being monitored Patient Re-evaluated:Patient Re-evaluated prior to inductionOxygen Delivery Method: Circle System Utilized Preoxygenation: Pre-oxygenation with 100% oxygen Intubation Type: IV induction Ventilation: Mask ventilation without difficulty LMA: LMA inserted LMA Size: 4.0 Number of attempts: 1 Airway Equipment and Method: Bite block Placement Confirmation: positive ETCO2 and breath sounds checked- equal and bilateral Tube secured with: Tape Dental Injury: Teeth and Oropharynx as per pre-operative assessment

## 2016-10-27 NOTE — Interval H&P Note (Signed)
[facility] [place]  History and Physical Update  The patient was interviewed and re-examined.  The patient's previous History and Physical has been reviewed and is unchanged from my consult except for: interval thrombosis of the L arm fistula.  The plan is: thrombectomy of L arm fistula, possible LUA AVG placement.   Risk, benefits, and alternatives to access surgery were discussed.    The patient is aware the risks include but are not limited to: bleeding, infection, steal syndrome, nerve damage, ischemic monomelic neuropathy, failure to mature, need for additional procedures, death and stroke.    The patient agrees to proceed forward with the procedure.  Adele Barthel, MD [facility] [place] Office: [phone] Pager: [phone]  10/27/2016, 7:26 AM

## 2016-10-27 NOTE — H&P (View-Only) (Signed)
Postoperative Access Visit   History of Present Illness  Allen Miller is a 60 y.o. year old male who presents for postoperative follow-up for: L 2nd BVT (Date: 09/08/16).  The patient's wounds are healed.  The patient notes no steal symptoms.  The patient is able to complete their activities of daily living.  The patient's current symptoms are: no thrill in fistula.  Pt's HD unit noticed this on Monday.   Past Medical History:  Diagnosis Date  . ESRD (end stage renal disease) ([place])    MWF  . Hypertension   . Shortness of breath dyspnea    when lying flat  . Stroke [facility])    effective vision- Left eye    Past Surgical History:  Procedure Laterality Date  . BASCILIC VEIN TRANSPOSITION Left 02/24/2016   Procedure: BASILIC VEIN TRANSPOSITION VERSUS ARTERIOVENOUS GRAFT INSERTION;  Surgeon: [name], MD;  Location: [place];  Service: Vascular;  Laterality: Left;  . BASCILIC VEIN TRANSPOSITION Left 07/14/2016   Procedure: LEFT BASILIC VEIN TRANSPOSITION;  Surgeon: Conrad Qulin, MD;  Location: [place];  Service: Vascular;  Laterality: Left;  . BASCILIC VEIN TRANSPOSITION Left 09/08/2016   Procedure: SECOND STAGE BASILIC VEIN TRANSPOSITION LEFT UPPER ARM;  Surgeon: Conrad Dover, MD;  Location: [place];  Service: Vascular;  Laterality: Left;  . INSERTION OF DIALYSIS CATHETER N/A 02/24/2016   Procedure: INSERTION OF DIALYSIS CATHETER;  Surgeon: [name], MD;  Location: [facility] OR;  Service: Vascular;  Laterality: N/A;    Social History   Social History  . Marital status: Single    Spouse name: N/A  . Number of children: N/A  . Years of education: N/A   Occupational History  . Not on file.   Social History Main Topics  . Smoking status: Never Smoker  . Smokeless tobacco: Never Used  . Alcohol use No  . Drug use: No  . Sexual activity: Not on file   Other Topics Concern  . Not on file   Social History Narrative  . No narrative on file    No family history on  file.   Current Outpatient Prescriptions  Medication Sig Dispense Refill  . amLODipine (NORVASC) 10 MG tablet Take 1 tablet (10 mg total) by mouth daily. 30 tablet 0  . aspirin 81 MG chewable tablet Chew 1 tablet (81 mg total) by mouth daily. 30 tablet 5  . B Complex Vitamins (B-COMPLEX/B-12) LIQD Place 2 sprays under the tongue daily.    . calcium acetate (PHOSLO) 667 MG capsule Take 667 mg by mouth 3 (three) times daily.  6  . carvedilol (COREG) 12.5 MG tablet Take 1 tablet (12.5 mg total) by mouth 2 (two) times daily with a meal. 60 tablet 0  . furosemide (LASIX) 80 MG tablet Take 1 tablet (80 mg total) by mouth 2 (two) times daily. 60 tablet 0  . hydrALAZINE (APRESOLINE) 100 MG tablet Take 0.5 tablets (50 mg total) by mouth 3 (three) times daily. 90 tablet 0  . losartan-hydrochlorothiazide (HYZAAR) 100-25 MG tablet TAKE 1 TAB BY MOUTH ONCE A DAY.  2  . oxyCODONE-acetaminophen (PERCOCET/ROXICET) 5-325 MG tablet Take 1 tablet by mouth every 6 (six) hours as needed. 15 tablet 0   No current facility-administered medications for this visit.      Allergies  Allergen Reactions  . No Known Allergies     REVIEW OF SYSTEMS:  (Positives checked otherwise negative)  CARDIOVASCULAR:   [ ]  chest pain,  [ ]   chest pressure,  [ ]  palpitations,  [ ]  shortness of breath when laying flat,  [ ]  shortness of breath with exertion,   [ ]  pain in feet when walking,  [ ]  pain in feet when laying flat, [ ]  history of blood clot in veins (DVT),  [ ]  history of phlebitis,  [ ]  swelling in legs,  [ ]  varicose veins  PULMONARY:   [ ]  productive cough,  [ ]  asthma,  [ ]  wheezing  NEUROLOGIC:   [ ]  weakness in arms or legs,  [ ]  numbness in arms or legs,  [ ]  difficulty speaking or slurred speech,  [ ]  temporary loss of vision in one eye,  [ ]  dizziness  HEMATOLOGIC:   [ ]  bleeding problems,  [ ]  problems with blood clotting too easily  MUSCULOSKEL:   [ ]  joint pain, [ ]  joint  swelling  GASTROINTEST:   [ ]  vomiting blood,  [ ]  blood in stool     GENITOURINARY:   [ ]  burning with urination,  [ ]  blood in urine [x]  end stage renal disease-HD: M/W/F   PSYCHIATRIC:   [ ]  history of major depression  INTEGUMENTARY:   [ ]  rashes,  [ ]  ulcers  CONSTITUTIONAL:   [ ]  fever,  [ ]  chills   Physical Examination Vitals:   10/16/16 1056  BP: 125/76  Pulse: 90  Resp: 20  Temp: 98.2 F (36.8 C)   Pulmonary: Sym exp, good air movt, CTAB, no rales, rhonchi, & wheezing  Cardiac: RRR, Nl S1, S2, no Murmurs, rubs or gallops  LUE: Incision is healed, skin feels warm, hand grip is 5/5, sensation in digits is intact, no palpable thrill, no bruit can  be auscultated    Medical Decision Making  Allen Miller is a 60 y.o. year old male who presents s/p thrombosed L 2nd BVT.   Unclear why this patient's fistula thrombosed.  Will bring the patient back to the OR for thrombectomy of the basilic vein transposition.    Pt is a bit unclear of the timing of the occlusion or I would proceed with percutaneous thrombectomy.  If this fistula cannot be salvaged, I would immediately place a LUA AVG, Risk, benefits, and alternatives to access surgery were discussed.   The patient is aware the risks include but are not limited to: bleeding, infection, steal syndrome, nerve damage, ischemic monomelic neuropathy, failure to mature, need for additional procedures, death and stroke.  The patient agrees to proceed forward with the procedure.  Thank you for allowing [place] to participate in this patient's care.   Adele Barthel, MD, FACS [facility] [place] Office: [phone] Pager: [phone]

## 2016-10-27 NOTE — Anesthesia Postprocedure Evaluation (Signed)
Anesthesia Post Note  Patient: Allen Miller  Procedure(s) Performed: Procedure(s) (LRB): INSERTION LEFT UPPER ARM  OF ARTERIOVENOUS (AV) GORE-TEX GRAFT ARM (Left) EXCHANGE OF RIGHT INTERNAL JUGULAR DIALYSIS CATHETER (Right)  Patient location during evaluation: PACU Anesthesia Type: General Level of consciousness: awake and alert Pain management: pain level controlled Vital Signs Assessment: post-procedure vital signs reviewed and stable Respiratory status: spontaneous breathing, nonlabored ventilation, respiratory function stable and patient connected to nasal cannula oxygen Cardiovascular status: blood pressure returned to baseline and stable Postop Assessment: no signs of nausea or vomiting Anesthetic complications: no    Last Vitals:  Vitals:   10/27/16 1347 10/27/16 1352  BP:  (!) 146/85  Pulse: 64 70  Resp: 19 18  Temp: 36.7 C     Last Pain:  Vitals:   10/27/16 1352  TempSrc:   PainSc: 0-No pain                 Alvina Strother DAVID

## 2016-10-27 NOTE — Final Progress Note (Signed)
Got order from Dr Conrad Whitfield to access [place] cath for iv use.

## 2016-10-27 NOTE — Interval H&P Note (Signed)
[facility] [place]  History and Physical Update Addendum  Tunneled dialysis catheter also not functioning this AM, so will exchange TDC also.  Adele Barthel, MD [facility] [place] Office: [phone] Pager: [phone]  10/27/2016, 8:48 AM

## 2016-10-27 NOTE — Discharge Instructions (Signed)
° ° °  10/27/2016 [name] [id number] [birth year]  Surgeon(s): Conrad Cadott, MD  Procedure(s): INSERTION LEFT UPPER ARM  OF ARTERIOVENOUS (AV) GORE-TEX GRAFT ARM EXCHANGE OF RIGHT INTERNAL JUGULAR DIALYSIS CATHETER  x Do not stick graft for 4 weeks

## 2016-10-28 ENCOUNTER — Encounter (HOSPITAL_COMMUNITY): Payer: Self-pay | Admitting: Vascular Surgery

## 2016-11-20 ENCOUNTER — Encounter: Payer: Self-pay | Admitting: Vascular Surgery

## 2016-12-01 NOTE — Progress Notes (Signed)
    Postoperative Access Visit   History of Present Illness  Allen Miller is a 61 y.o. year old male who presents for postoperative follow-up for: LUA AVG (Date: 10/27/16).  The patient's wounds are near healed.  The patient notes no steal symptoms.  The patient is able to complete their activities of daily living.  The patient's current symptoms are: none.  For VQI Use Only  PRE-ADM LIVING: Home  AMB STATUS: Ambulatory  Physical Examination Vitals:   12/04/16 1330 12/04/16 1333  BP: (!) 199/95 (!) 193/94  Pulse: 84   Resp: 16   Temp: 98.4 F (36.9 C)     LUE: axillary incision is healed, antecubital incision is mostly healed some suture spitting (removed today), skin feels warm, hand grip is 5/5, sensation in digits is intact, palpable thrill, bruit can be auscultated   Medical Decision Making  Allen Miller is a 62 y.o. year old male who presents s/p LUA AVG placement.   The patient's access is ready for use.  The patient's tunneled dialysis catheter can be removed after two successful cannulations and completed dialysis treatments.  Thank you for allowing [place] to participate in this patient's care.  Adele Barthel, MD, FACS [facility] [place] Office: [phone] Pager: [phone]

## 2016-12-04 ENCOUNTER — Encounter: Payer: Self-pay | Admitting: Vascular Surgery

## 2016-12-04 ENCOUNTER — Ambulatory Visit (INDEPENDENT_AMBULATORY_CARE_PROVIDER_SITE_OTHER): Payer: Medicare Other | Admitting: Vascular Surgery

## 2016-12-04 VITALS — BP 193/94 | HR 84 | Temp 98.4°F | Resp 16 | Ht 65.0 in | Wt 193.0 lb

## 2016-12-04 DIAGNOSIS — Z992 Dependence on renal dialysis: Secondary | ICD-10-CM

## 2016-12-04 DIAGNOSIS — N186 End stage renal disease: Secondary | ICD-10-CM

## 2016-12-11 ENCOUNTER — Other Ambulatory Visit: Payer: Self-pay

## 2016-12-11 ENCOUNTER — Encounter (HOSPITAL_COMMUNITY): Payer: Self-pay | Admitting: *Deleted

## 2016-12-11 NOTE — Progress Notes (Signed)
Pt denies SOB, chest pain, and being under the care of a cardiologist. Pt denies having a stress test and cardiac cath. Pt denies having a chest x ray within the last year. Pt made aware to stop taking otc vitamins, fish oil and herbal medications. Do not take any NSAIDs ie: Ibuprofen, Advil, Naproxen BC and Goody Powder. Pt verbalized understanding of all pre-op instructions.

## 2016-12-14 ENCOUNTER — Ambulatory Visit (HOSPITAL_COMMUNITY): Payer: BLUE CROSS/BLUE SHIELD | Admitting: Anesthesiology

## 2016-12-14 ENCOUNTER — Encounter (HOSPITAL_COMMUNITY)
Admission: RE | Disposition: A | Discharge status: Home / Self Care | Payer: Self-pay | Source: Ambulatory Visit | Attending: Vascular Surgery

## 2016-12-14 ENCOUNTER — Ambulatory Visit (HOSPITAL_COMMUNITY)
Admission: RE | Admit: 2016-12-14 | Discharge: 2016-12-14 | Disposition: A | Discharge status: Home / Self Care | Payer: BLUE CROSS/BLUE SHIELD | Source: Ambulatory Visit | Attending: Vascular Surgery | Admitting: Vascular Surgery

## 2016-12-14 ENCOUNTER — Encounter (HOSPITAL_COMMUNITY): Payer: Self-pay | Admitting: *Deleted

## 2016-12-14 DIAGNOSIS — T82898A Other specified complication of vascular prosthetic devices, implants and grafts, initial encounter: Secondary | ICD-10-CM | POA: Diagnosis not present

## 2016-12-14 DIAGNOSIS — Z8673 Personal history of transient ischemic attack (TIA), and cerebral infarction without residual deficits: Secondary | ICD-10-CM | POA: Diagnosis not present

## 2016-12-14 DIAGNOSIS — I120 Hypertensive chronic kidney disease with stage 5 chronic kidney disease or end stage renal disease: Secondary | ICD-10-CM | POA: Insufficient documentation

## 2016-12-14 DIAGNOSIS — Z79899 Other long term (current) drug therapy: Secondary | ICD-10-CM | POA: Diagnosis not present

## 2016-12-14 DIAGNOSIS — N186 End stage renal disease: Secondary | ICD-10-CM | POA: Insufficient documentation

## 2016-12-14 DIAGNOSIS — Z992 Dependence on renal dialysis: Secondary | ICD-10-CM | POA: Diagnosis not present

## 2016-12-14 DIAGNOSIS — Y832 Surgical operation with anastomosis, bypass or graft as the cause of abnormal reaction of the patient, or of later complication, without mention of misadventure at the time of the procedure: Secondary | ICD-10-CM | POA: Insufficient documentation

## 2016-12-14 DIAGNOSIS — T82858A Stenosis of vascular prosthetic devices, implants and grafts, initial encounter: Secondary | ICD-10-CM | POA: Diagnosis not present

## 2016-12-14 HISTORY — PX: THROMBECTOMY W/ EMBOLECTOMY: SHX2507

## 2016-12-14 LAB — POCT I-STAT 4, (NA,K, GLUC, HGB,HCT)
GLUCOSE: 87 mg/dL (ref 65–99)
HCT: 33 % — ABNORMAL LOW (ref 39.0–52.0)
Hemoglobin: 11.2 g/dL — ABNORMAL LOW (ref 13.0–17.0)
Potassium: 5.5 mmol/L — ABNORMAL HIGH (ref 3.5–5.1)
Sodium: 140 mmol/L (ref 135–145)

## 2016-12-14 SURGERY — THROMBECTOMY ARTERIOVENOUS GORE-TEX GRAFT
Anesthesia: General | Site: Arm Upper | Laterality: Left

## 2016-12-14 MED ORDER — PHENYLEPHRINE 40 MCG/ML (10ML) SYRINGE FOR IV PUSH (FOR BLOOD PRESSURE SUPPORT)
PREFILLED_SYRINGE | INTRAVENOUS | Status: DC | PRN
  Administered 2016-12-14 (×2): 80 ug via INTRAVENOUS
  Administered 2016-12-14: 40 ug via INTRAVENOUS
  Administered 2016-12-14: 80 ug via INTRAVENOUS
  Administered 2016-12-14: 40 ug via INTRAVENOUS

## 2016-12-14 MED ORDER — FENTANYL CITRATE (PF) 250 MCG/5ML IJ SOLN
INTRAMUSCULAR | Status: AC
  Filled 2016-12-14: qty 5

## 2016-12-14 MED ORDER — SODIUM CHLORIDE 0.9 % IV SOLN
INTRAVENOUS | Status: DC | PRN
  Administered 2016-12-14: 12:00:00

## 2016-12-14 MED ORDER — SODIUM CHLORIDE 0.9 % IV SOLN
INTRAVENOUS | Status: DC
  Administered 2016-12-14: 10:00:00 via INTRAVENOUS

## 2016-12-14 MED ORDER — PROMETHAZINE HCL 25 MG/ML IJ SOLN: 6.2500 mg | INTRAMUSCULAR | Status: DC | PRN

## 2016-12-14 MED ORDER — STERILE WATER FOR IRRIGATION IR SOLN
Status: DC | PRN
  Administered 2016-12-14: 1000 mL

## 2016-12-14 MED ORDER — MIDAZOLAM HCL 5 MG/5ML IJ SOLN
INTRAMUSCULAR | Status: DC | PRN
  Administered 2016-12-14: 2 mg via INTRAVENOUS

## 2016-12-14 MED ORDER — EPHEDRINE 5 MG/ML INJ
INTRAVENOUS | Status: AC
  Filled 2016-12-14: qty 10

## 2016-12-14 MED ORDER — DEXTROSE 5 % IV SOLN
1.5000 g | INTRAVENOUS | Status: AC
  Administered 2016-12-14: 1.5 g via INTRAVENOUS
  Filled 2016-12-14: qty 1.5

## 2016-12-14 MED ORDER — LIDOCAINE HCL (PF) 2 % IJ SOLN
INTRAMUSCULAR | Status: DC | PRN
  Administered 2016-12-14: 80 mg via INTRADERMAL

## 2016-12-14 MED ORDER — DEXAMETHASONE SODIUM PHOSPHATE 10 MG/ML IJ SOLN
INTRAMUSCULAR | Status: AC
  Filled 2016-12-14: qty 1

## 2016-12-14 MED ORDER — EPHEDRINE SULFATE 50 MG/ML IJ SOLN
INTRAMUSCULAR | Status: DC | PRN
  Administered 2016-12-14: 10 mg via INTRAVENOUS
  Administered 2016-12-14 (×2): 5 mg via INTRAVENOUS
  Administered 2016-12-14: 10 mg via INTRAVENOUS
  Administered 2016-12-14: 5 mg via INTRAVENOUS

## 2016-12-14 MED ORDER — FUROSEMIDE 80 MG PO TABS: 80.0000 mg | ORAL_TABLET | Freq: Every day | ORAL | Status: DC

## 2016-12-14 MED ORDER — CHLORHEXIDINE GLUCONATE CLOTH 2 % EX PADS: 6.0000 | MEDICATED_PAD | Freq: Once | CUTANEOUS | Status: DC

## 2016-12-14 MED ORDER — PROPOFOL 10 MG/ML IV BOLUS
INTRAVENOUS | Status: AC
  Filled 2016-12-14: qty 20

## 2016-12-14 MED ORDER — ONDANSETRON HCL 4 MG/2ML IJ SOLN
INTRAMUSCULAR | Status: AC
  Filled 2016-12-14: qty 2

## 2016-12-14 MED ORDER — PHENYLEPHRINE 40 MCG/ML (10ML) SYRINGE FOR IV PUSH (FOR BLOOD PRESSURE SUPPORT)
PREFILLED_SYRINGE | INTRAVENOUS | Status: AC
  Filled 2016-12-14: qty 10

## 2016-12-14 MED ORDER — OXYCODONE-ACETAMINOPHEN 5-325 MG PO TABS: 1.0000 | ORAL_TABLET | Freq: Four times a day (QID) | ORAL | 0 refills | Status: DC | PRN

## 2016-12-14 MED ORDER — SODIUM CHLORIDE 0.9 % IR SOLN
Status: DC | PRN
  Administered 2016-12-14: 1000 mL

## 2016-12-14 MED ORDER — HYDRALAZINE HCL 100 MG PO TABS: 50.0000 mg | ORAL_TABLET | Freq: Two times a day (BID) | ORAL | Status: DC

## 2016-12-14 MED ORDER — HYDROMORPHONE HCL 1 MG/ML IJ SOLN: 0.2500 mg | INTRAMUSCULAR | Status: DC | PRN

## 2016-12-14 MED ORDER — FENTANYL CITRATE (PF) 250 MCG/5ML IJ SOLN
INTRAMUSCULAR | Status: DC | PRN
  Administered 2016-12-14 (×2): 25 ug via INTRAVENOUS

## 2016-12-14 MED ORDER — LIDOCAINE 2% (20 MG/ML) 5 ML SYRINGE
INTRAMUSCULAR | Status: AC
  Filled 2016-12-14: qty 5

## 2016-12-14 MED ORDER — CARVEDILOL 12.5 MG PO TABS
12.5000 mg | ORAL_TABLET | Freq: Once | ORAL | Status: AC
  Administered 2016-12-14: 12.5 mg via ORAL
  Filled 2016-12-14 (×2): qty 1

## 2016-12-14 MED ORDER — AMLODIPINE BESYLATE 10 MG PO TABS
10.0000 mg | ORAL_TABLET | Freq: Once | ORAL | Status: AC
  Administered 2016-12-14: 10 mg via ORAL
  Filled 2016-12-14: qty 1

## 2016-12-14 MED ORDER — MIDAZOLAM HCL 2 MG/2ML IJ SOLN
INTRAMUSCULAR | Status: AC
  Filled 2016-12-14: qty 2

## 2016-12-14 SURGICAL SUPPLY — 31 items
ARMBAND PINK RESTRICT EXTREMIT (MISCELLANEOUS) ×3 IMPLANT
CANISTER SUCTION 2500CC (MISCELLANEOUS) ×3 IMPLANT
CANNULA VESSEL 3MM 2 BLNT TIP (CANNULA) ×3 IMPLANT
CATH EMB 4FR 80CM (CATHETERS) ×3 IMPLANT
CLIP LIGATING EXTRA MED SLVR (CLIP) ×3 IMPLANT
CLIP LIGATING EXTRA SM BLUE (MISCELLANEOUS) ×3 IMPLANT
DECANTER SPIKE VIAL GLASS SM (MISCELLANEOUS) IMPLANT
DERMABOND ADVANCED (GAUZE/BANDAGES/DRESSINGS) ×2
DERMABOND ADVANCED .7 DNX12 (GAUZE/BANDAGES/DRESSINGS) ×1 IMPLANT
ELECT REM PT RETURN 9FT ADLT (ELECTROSURGICAL) ×3
ELECTRODE REM PT RTRN 9FT ADLT (ELECTROSURGICAL) ×1 IMPLANT
GEL ULTRASOUND 20GR AQUASONIC (MISCELLANEOUS) IMPLANT
GLOVE BIO SURGEON STRL SZ 6.5 (GLOVE) ×8 IMPLANT
GLOVE BIO SURGEONS STRL SZ 6.5 (GLOVE) ×4
GLOVE BIOGEL PI IND STRL 6.5 (GLOVE) ×1 IMPLANT
GLOVE BIOGEL PI INDICATOR 6.5 (GLOVE) ×2
GLOVE SS BIOGEL STRL SZ 7.5 (GLOVE) ×1 IMPLANT
GLOVE SUPERSENSE BIOGEL SZ 7.5 (GLOVE) ×2
GOWN STRL REUS W/ TWL LRG LVL3 (GOWN DISPOSABLE) ×4 IMPLANT
GOWN STRL REUS W/TWL LRG LVL3 (GOWN DISPOSABLE) ×8
GRAFT GORETEX STRT 7X10 (Vascular Products) ×3 IMPLANT
KIT BASIN OR (CUSTOM PROCEDURE TRAY) ×3 IMPLANT
KIT ROOM TURNOVER OR (KITS) ×3 IMPLANT
NS IRRIG 1000ML POUR BTL (IV SOLUTION) ×3 IMPLANT
PACK CV ACCESS (CUSTOM PROCEDURE TRAY) ×3 IMPLANT
PAD ARMBOARD 7.5X6 YLW CONV (MISCELLANEOUS) ×6 IMPLANT
SUT PROLENE 6 0 CC (SUTURE) ×6 IMPLANT
SUT VIC AB 3-0 SH 27 (SUTURE) ×2
SUT VIC AB 3-0 SH 27X BRD (SUTURE) ×1 IMPLANT
UNDERPAD 30X30 (UNDERPADS AND DIAPERS) ×3 IMPLANT
WATER STERILE IRR 1000ML POUR (IV SOLUTION) ×3 IMPLANT

## 2016-12-14 NOTE — Interval H&P Note (Signed)
History and Physical Interval Note:  12/14/2016 11:54 AM  Allen Miller  has presented today for surgery, with the diagnosis of End Stage Renal Disease N18.6  The various methods of treatment have been discussed with the patient and family. After consideration of risks, benefits and other options for treatment, the patient has consented to  Procedure(s): THROMBECTOMY ARTERIOVENOUS GORE-TEX GRAFT- OPEN PROCEDURE (Left) SHUNTOGRAM (Left) as a surgical intervention .  The patient's history has been reviewed, patient examined, no change in status, stable for surgery.  I have reviewed the patient's chart and labs.  Questions were answered to the patient's satisfaction.    The patient had occlusion of his left arm graft shortly after the above visit with Dr. Geryl Councilman. Discussed plan for thrombectomy of his left upper arm graft. He continues to have   a right IJ hemodialysis catheter. Does have 2+ left radial pulse Tyshawn Ciullo, Sherren Mocha

## 2016-12-14 NOTE — Discharge Instructions (Signed)
° ° °  12/14/2016 Allen Miller [id number] 28-May-1956  Surgeon(s): [name], MD  Procedure(s): LEFT THROMBECTOMY; LEFT  ARTERIOVENOUS GORE-TEX GRAFT- OPEN PROCEDURE  x May stick graft on designated area only:  Do not stick over incision x 4 weeks-otherwise, can stick immediately

## 2016-12-14 NOTE — Anesthesia Postprocedure Evaluation (Addendum)
Anesthesia Post Note  Patient: Allen Miller  Procedure(s) Performed: Procedure(s) (LRB): THROMBECTOMY OF LEFT  ARTERIO-VENOUS GORTEX GRAFT; REVISION OF LEFT  ARTERIO-VENOUS GORE-TEX GRAFT (Left)  Patient location during evaluation: PACU Anesthesia Type: General Level of consciousness: sedated Pain management: pain level controlled Vital Signs Assessment: post-procedure vital signs reviewed and stable Respiratory status: spontaneous breathing and respiratory function stable Cardiovascular status: stable Anesthetic complications: no       Last Vitals:  Vitals:   12/14/16 1445 12/14/16 1451  BP: (!) 188/92 (!) 184/93  Pulse: 70 74  Resp: 12 16  Temp:      Last Pain:  Vitals:   12/14/16 0925  TempSrc: Oral                 Quindarius Cabello DANIEL

## 2016-12-14 NOTE — Op Note (Signed)
    OPERATIVE REPORT  DATE OF SURGERY: 12/14/2016  PATIENT: Allen Miller, 61 y.o. male MRN: [id number]  DOB: 11-06-1956  PRE-OPERATIVE DIAGNOSIS: End-stage renal disease with occluded left upper arm Gore-Tex graft  POST-OPERATIVE DIAGNOSIS:  Same  PROCEDURE: Thrombectomy of left upper arm AV Gore-Tex graft and revision with interposition 7 mm Gore-Tex to the axillary vein  SURGEON:  Curt Jews, M.D.  PHYSICIAN ASSISTANT: Samantha Rhyne PA-C  ANESTHESIA:  Gen.  EBL: 50 ml  Total I/O In: 400 [I.V.:400] Out: 50 [Blood:50]  BLOOD ADMINISTERED: None  DRAINS: None  SPECIMEN: None  COUNTS CORRECT:  YES  PLAN OF CARE: PACU   PATIENT DISPOSITION:  PACU - hemodynamically stable  PROCEDURE DETAILS: Patient was taken to the operating placed supine position where the area of the left arm left axilla reprepped and draped in usual sterile fashion. Incision was made over the prior axillary to Gore-Tex anastomosis. The graft was exposed near the venous anastomosis. The graft was opened transversely and the graft was thrombectomized. There was a 4 mm arterial anastomosis since this was a 4-7 mm tapered graft. The arterialized plug was removed and excellent flow was encountered. This was flushed with heparinized and reoccluded. 5 dilator would not pass to the venous anastomosis. The vein was exposed further proximally and was of better caliber. The vein was ligated below the venous anastomosis. The vein was transected and the vein was spatulated. A 4 Fogarty was passed into the central system with no evidence of central venous stenosis. The vein was flushed with heparinized and reoccluded. The 7 mm Gore-Tex was brought onto the field and was spatulated and was sewn into into the vein with a running 6-0 Prolene suture. The graft was flushed heparinized saline and reoccluded. The aneurysm graft was cut to appropriate length and was sewn into into the old graft with a running 6-0 Prolene suture.  Clamps removed and good flow was noted through the graft. Wounds irrigated with saline. The wound was closed with 3-0 Vicryl in the subcutaneous and subcuticular tissue. Dermabond was applied   Allen Miller, M.D., [facility] 12/14/2016 2:11 PM

## 2016-12-14 NOTE — Progress Notes (Signed)
BP 237/110 recheck 247/109 Dr Tobias Alexander informed new orders noted. Also unable to obtain Perf iv order to access Hemo cath.

## 2016-12-14 NOTE — H&P (View-Only) (Signed)
    Postoperative Access Visit   History of Present Illness  Allen Miller is a 61 y.o. year old male who presents for postoperative follow-up for: LUA AVG (Date: 10/27/16).  The patient's wounds are near healed.  The patient notes no steal symptoms.  The patient is able to complete their activities of daily living.  The patient's current symptoms are: none.  For VQI Use Only  PRE-ADM LIVING: Home  AMB STATUS: Ambulatory  Physical Examination Vitals:   12/04/16 1330 12/04/16 1333  BP: (!) 199/95 (!) 193/94  Pulse: 84   Resp: 16   Temp: 98.4 F (36.9 C)     LUE: axillary incision is healed, antecubital incision is mostly healed some suture spitting (removed today), skin feels warm, hand grip is 5/5, sensation in digits is intact, palpable thrill, bruit can be auscultated   Medical Decision Making  Allen Miller is a 61 y.o. year old male who presents s/p LUA AVG placement.   The patient's access is ready for use.  The patient's tunneled dialysis catheter can be removed after two successful cannulations and completed dialysis treatments.  Thank you for allowing [place] to participate in this patient's care.  Adele Barthel, MD, FACS [facility] [place] Office: [phone] Pager: [phone]

## 2016-12-14 NOTE — Anesthesia Preprocedure Evaluation (Signed)
Anesthesia Evaluation  Patient identified by MRN, date of birth, ID band Patient awake    Reviewed: Allergy & Precautions, NPO status , Patient's Chart, lab work & pertinent test results, reviewed documented beta blocker date and time   History of Anesthesia Complications Negative for: history of anesthetic complications  Airway Mallampati: II  TM Distance: >3 FB Neck ROM: Full    Dental  (+) Teeth Intact, Dental Advisory Given   Pulmonary    Pulmonary exam normal breath sounds clear to auscultation       Cardiovascular hypertension, Pt. on home beta blockers and Pt. on medications Normal cardiovascular exam Rhythm:Regular Rate:Normal  ------------------------------------------------------------------- Study Conclusions  - Left ventricle: The cavity size was normal. Wall thickness was   increased in a pattern of moderate to severe LVH. Systolic   function was normal. The estimated ejection fraction was in the   range of 50% to 55%. Wall motion was normal; there were no   regional wall motion abnormalities. Doppler parameters are   consistent with a reversible restrictive pattern, indicative of   decreased left ventricular diastolic compliance and/or increased   left atrial pressure (grade 3 diastolic dysfunction). - Aortic valve: There was mild regurgitation. - Mitral valve: There was mild regurgitation. - Left atrium: The atrium was moderately dilated. - Pericardium, extracardiac: A small to moderate pericardial   effusion was identified circumferential to the heart.   Neuro/Psych CVA, Residual Symptoms negative psych ROS   GI/Hepatic negative GI ROS, Neg liver ROS,   Endo/Other  Obesity   Renal/GU ESRF and DialysisRenal disease (MWF)K+ 5.5     Musculoskeletal negative musculoskeletal ROS (+)   Abdominal   Peds  Hematology  (+) Blood dyscrasia, anemia ,   Anesthesia Other Findings Day of surgery  medications reviewed with the patient.  Reproductive/Obstetrics                             Anesthesia Physical  Anesthesia Plan  ASA: III  Anesthesia Plan: General   Post-op Pain Management:    Induction: Intravenous  Airway Management Planned: LMA  Additional Equipment:   Intra-op Plan:   Post-operative Plan: Extubation in OR  Informed Consent: I have reviewed the patients History and Physical, chart, labs and discussed the procedure including the risks, benefits and alternatives for the proposed anesthesia with the patient or authorized representative who has indicated his/her understanding and acceptance.   Dental advisory given  Plan Discussed with: CRNA  Anesthesia Plan Comments:         Anesthesia Quick Evaluation

## 2016-12-14 NOTE — Progress Notes (Signed)
Gerald Stabs, CRNA called and informed of need to access Dialysis cath. States she will send someone over.

## 2016-12-14 NOTE — Transfer of Care (Signed)
Immediate Anesthesia Transfer of Care Note  Patient: Allen Miller  Procedure(s) Performed: Procedure(s): THROMBECTOMY OF LEFT  ARTERIO-VENOUS GORTEX GRAFT; REVISION OF LEFT  ARTERIO-VENOUS GORE-TEX GRAFT (Left)  Patient Location: PACU  Anesthesia Type:General  Level of Consciousness: awake, alert , oriented and patient cooperative  Airway & Oxygen Therapy: Patient Spontanous Breathing and Patient connected to nasal cannula oxygen  Post-op Assessment: Report given to RN and Post -op Vital signs reviewed and stable  Post vital signs: Reviewed  Last Vitals: 173/88, 69, 20, 100% Vitals:   12/14/16 1012 12/14/16 1359  BP: (!) 247/109   Pulse: 80 (P) 69  Resp:    Temp:  (P) 36.5 C    Last Pain:  Vitals:   12/14/16 0925  TempSrc: Oral      Patients Stated Pain Goal: 2 (27/74/12 8786)  Complications: No apparent anesthesia complications

## 2016-12-15 ENCOUNTER — Encounter (HOSPITAL_COMMUNITY): Payer: Self-pay | Admitting: Vascular Surgery

## 2016-12-22 ENCOUNTER — Ambulatory Visit (INDEPENDENT_AMBULATORY_CARE_PROVIDER_SITE_OTHER): Payer: Medicare Other | Admitting: Vascular Surgery

## 2016-12-22 ENCOUNTER — Encounter: Payer: Self-pay | Admitting: Vascular Surgery

## 2016-12-22 VITALS — BP 183/88 | HR 88 | Temp 97.7°F | Resp 18 | Ht 63.75 in | Wt 191.9 lb

## 2016-12-22 DIAGNOSIS — N186 End stage renal disease: Secondary | ICD-10-CM

## 2016-12-22 DIAGNOSIS — Z992 Dependence on renal dialysis: Secondary | ICD-10-CM

## 2016-12-22 NOTE — Progress Notes (Signed)
   Patient name: Allen Miller MRN: [id number] DOB: [birth year] Sex: male  REASON FOR VISIT: Evaluation left upper arm AV graft  HPI: Allen Miller is a 61 y.o. male here today for evaluation of left upper arm AV graft. This was initially placed by Dr. Geryl Councilman on 10/27/2016. Is a 4-7 mm graft arising off the brachial artery. He had thrombosis of this and underwent lumpectomy and revision of the venous anastomosis by myself on 12/14/2016. At that time he had very good arterial flow into the graft. He was seen today for follow-up. He reports that he has had good flow through the graft but that there is been some difficulty in accessing the graft. He reports that some the technicians are having more difficult time but the more experienced technician having no difficulty accessing the graft.  Current Outpatient Prescriptions  Medication Sig Dispense Refill  . aspirin 81 MG chewable tablet Chew 1 tablet (81 mg total) by mouth daily. 30 tablet 5  . B Complex Vitamins (B-COMPLEX/B-12) LIQD Place 2 sprays under the tongue daily.    . calcium acetate (PHOSLO) 667 MG capsule Take 667 mg by mouth 3 (three) times daily with meals.   6  . amLODipine (NORVASC) 10 MG tablet Take 1 tablet (10 mg total) by mouth daily. (Patient not taking: Reported on 12/22/2016) 30 tablet 0  . carvedilol (COREG) 12.5 MG tablet Take 1 tablet (12.5 mg total) by mouth 2 (two) times daily with a meal. (Patient not taking: Reported on 12/22/2016) 60 tablet 0  . furosemide (LASIX) 80 MG tablet Take 1 tablet (80 mg total) by mouth daily. (Patient not taking: Reported on 12/22/2016)    . hydrALAZINE (APRESOLINE) 100 MG tablet Take 0.5 tablets (50 mg total) by mouth 2 (two) times daily. (Patient not taking: Reported on 12/22/2016)    . losartan-hydrochlorothiazide (HYZAAR) 100-25 MG tablet TAKE 1 TAB BY MOUTH ONCE A DAY.  2  . oxyCODONE-acetaminophen (PERCOCET/ROXICET) 5-325 MG tablet Take 1 tablet by mouth every  6 (six) hours as needed. (Patient not taking: Reported on 12/22/2016) 10 tablet 0   No current facility-administered medications for this visit.      PHYSICAL EXAM: Vitals:   12/22/16 0956 12/22/16 0959  BP: (!) 189/91 (!) 183/88  Pulse: 88   Resp: 18   Temp: 97.7 F (36.5 C)   TempSrc: Oral   SpO2: 100%   Weight: 191 lb 14.4 oz (87 kg)   Height: 5' 3.75" (1.619 m)     GENERAL: The patient is a well-nourished male, in no acute distress. The vital signs are documented above. Goal exam his incisions are all well-healed. He has an excellent thrill throughout the graft. Some the graft does run somewhat deep but is easily palpable.  MEDICAL ISSUES: No attack difficulty with his graft. I did extremely we could repeat the shuntogram but feel that it would be prudent to continue attempt access to this AV graft. It does appear that it should have adequate ability for accessing this. He will notify should he have continued difficulty.   [name], MD FACS [facility] Tel [phone] Pager [phone]

## 2017-02-25 ENCOUNTER — Other Ambulatory Visit: Payer: Self-pay

## 2017-02-25 ENCOUNTER — Ambulatory Visit (INDEPENDENT_AMBULATORY_CARE_PROVIDER_SITE_OTHER): Payer: Non-veteran care | Admitting: Physician Assistant

## 2017-02-25 VITALS — BP 139/84 | HR 84 | Temp 99.4°F | Resp 16 | Ht 64.0 in | Wt 184.0 lb

## 2017-02-25 DIAGNOSIS — N186 End stage renal disease: Secondary | ICD-10-CM | POA: Diagnosis not present

## 2017-02-25 DIAGNOSIS — Z992 Dependence on renal dialysis: Secondary | ICD-10-CM | POA: Diagnosis not present

## 2017-02-25 NOTE — Progress Notes (Signed)
History of Present Illness:  Patient is a 61 y.o. year old male who presents for placement of a permanent hemodialysis access. He has had three separate attempts at access so far.  First left radiocephalic, left basilic vein transposition and finally leading up to left UE af graft.  The graft was most recently revised by Dr. Donnetta Hutching.  Thrombectomy of left upper arm AV Gore-Tex graft and revision with interposition 7 mm Gore-Tex to the axillary vein.  He was sent to [facility] for revision again of the left UE AV graft 2 weeks ago for occluded graft.  This attempt failed and now he is on HD M-W-F via right IJ catheter.    He is here for revision verses new access options.  He reports no fever,chills, N/V/D.  No medication changes since his last visit 12/14/2016.    Past Medical History:  Diagnosis Date  . Cancer [facility])    kidney cancer  . ESRD (end stage renal disease) ([place])    MWF  . Hypertension   . Low iron   . Shortness of breath dyspnea    when lying flat  . Stroke [facility])    effective vision- Left eye    Past Surgical History:  Procedure Laterality Date  . AV FISTULA PLACEMENT Left 10/27/2016   Procedure: INSERTION LEFT UPPER ARM  OF ARTERIOVENOUS (AV) GORE-TEX GRAFT ARM;  Surgeon: Conrad [place], MD;  Location: [place];  Service: Vascular;  Laterality: Left;  . BASCILIC VEIN TRANSPOSITION Left 02/24/2016   Procedure: BASILIC VEIN TRANSPOSITION VERSUS ARTERIOVENOUS GRAFT INSERTION;  Surgeon: [name], MD;  Location: [place];  Service: Vascular;  Laterality: Left;  . BASCILIC VEIN TRANSPOSITION Left 07/14/2016   Procedure: LEFT BASILIC VEIN TRANSPOSITION;  Surgeon: Conrad [place], MD;  Location: [place];  Service: Vascular;  Laterality: Left;  . BASCILIC VEIN TRANSPOSITION Left 09/08/2016   Procedure: SECOND STAGE BASILIC VEIN TRANSPOSITION LEFT UPPER ARM;  Surgeon: Conrad [place], MD;  Location: [place];  Service: Vascular;  Laterality: Left;  . INSERTION OF DIALYSIS CATHETER N/A 02/24/2016   Procedure:  INSERTION OF DIALYSIS CATHETER;  Surgeon: [name], MD;  Location: [place];  Service: Vascular;  Laterality: N/A;  . INSERTION OF DIALYSIS CATHETER Right 10/27/2016   Procedure: EXCHANGE OF RIGHT INTERNAL JUGULAR DIALYSIS CATHETER;  Surgeon: Conrad , MD;  Location: [place];  Service: Vascular;  Laterality: Right;  . THROMBECTOMY W/ EMBOLECTOMY Left 12/14/2016   Procedure: THROMBECTOMY OF LEFT  ARTERIO-VENOUS GORTEX GRAFT; REVISION OF LEFT  ARTERIO-VENOUS GORE-TEX GRAFT;  Surgeon: [name], MD;  Location: [facility] OR;  Service: Vascular;  Laterality: Left;     Social History Social History  Substance Use Topics  . Smoking status: Never Smoker  . Smokeless tobacco: Never Used  . Alcohol use No    Family History No family history on file.  Allergies  Allergies  Allergen Reactions  . Pork-Derived Products Other (See Comments)    Pt does not eat pork  . Ppd [Tuberculin Purified Protein Derivative] Rash     Current Outpatient Prescriptions  Medication Sig Dispense Refill  . amLODipine (NORVASC) 10 MG tablet Take 1 tablet (10 mg total) by mouth daily. 30 tablet 0  . aspirin 81 MG chewable tablet Chew 1 tablet (81 mg total) by mouth daily. 30 tablet 5  . B Complex Vitamins (B-COMPLEX/B-12) LIQD Place 2 sprays under the tongue daily.    . calcium acetate (PHOSLO) 667 MG capsule Take  667 mg by mouth 3 (three) times daily with meals.   6  . carvedilol (COREG) 12.5 MG tablet Take 1 tablet (12.5 mg total) by mouth 2 (two) times daily with a meal. 60 tablet 0  . furosemide (LASIX) 80 MG tablet Take 1 tablet (80 mg total) by mouth daily.    . hydrALAZINE (APRESOLINE) 100 MG tablet Take 0.5 tablets (50 mg total) by mouth 2 (two) times daily.    Marland Kitchen losartan-hydrochlorothiazide (HYZAAR) 100-25 MG tablet TAKE 1 TAB BY MOUTH ONCE A DAY.  2  . oxyCODONE-acetaminophen (PERCOCET/ROXICET) 5-325 MG tablet Take 1 tablet by mouth every 6 (six) hours as needed. (Patient not taking: Reported on 02/25/2017)  10 tablet 0   No current facility-administered medications for this visit.     ROS:   General:  No weight loss, Fever, chills  HEENT: No recent headaches, no nasal bleeding, no visual changes, no sore throat  Neurologic: No dizziness, blackouts, seizures. No recent symptoms of stroke or mini- stroke. No recent episodes of slurred speech, or temporary blindness.  Cardiac: No recent episodes of chest pain/pressure, no shortness of breath at rest.  No shortness of breath with exertion.  Denies history of atrial fibrillation or irregular heartbeat  Vascular: No history of rest pain in feet.  No history of claudication.  No history of non-healing ulcer, No history of DVT   Pulmonary: No home oxygen, no productive cough, no hemoptysis,  No asthma or wheezing  Musculoskeletal:  [ ]  Arthritis, [ ]  Low back pain,  [ ]  Joint pain  Hematologic:No history of hypercoagulable state.  No history of easy bleeding.  No history of anemia  Gastrointestinal: No hematochezia or melena,  No gastroesophageal reflux, no trouble swallowing  Urinary: [ ]  chronic Kidney disease, [ x] on HD - [x ] MWF or [ ]  TTHS, [ ]  Burning with urination, [ ]  Frequent urination, [ ]  Difficulty urinating;   Skin: No rashes  Psychological: No history of anxiety,  No history of depression   Physical Examination  Vitals:   02/25/17 1309  BP: 139/84  Pulse: 84  Resp: 16  Temp: 99.4 F (37.4 C)  Weight: 184 lb (83.5 kg)  Height: 5\' 4"  (1.626 m)    Body mass index is 31.58 kg/m.  General:  Alert and oriented, no acute distress HEENT: Normal Neck: No bruit or JVD Pulmonary: Clear to auscultation bilaterally Cardiac: Regular Rate and Rhythm without murmur Gastrointestinal: Soft, non-tender, non-distended, no mass, no scars Skin: No rash Extremity Pulses:  2+ radial, brachial pulses bilaterally, left graft without thrill Musculoskeletal: No deformity or edema  Neurologic: Upper and lower extremity motor 5/5 and  symmetric    ASSESSMENT:  ESRD with occluded left UE AV graft   PLAN: We are going to schedule him for revision/thrombectomy of the left AV graft on 03/02/2017 by Dr. Oneida Alar.  We discussed that if this does not work we may have to consider using his right arm for access.  He is very adamant that he does not want any surgery on his right arm or the groins.  We will have to cross that bridge when we come to it.    Theda Sers, EMMA [facility] PA-C [facility] [place]   MD in clinic: Dr. Oneida Alar

## 2017-02-26 ENCOUNTER — Encounter (HOSPITAL_COMMUNITY): Payer: Self-pay | Admitting: *Deleted

## 2017-02-28 IMAGING — CR DG CHEST 1V PORT
1 series · 1 of 1 positions shown · non-contrast
Comparison: 02/24/2016

CLINICAL DATA: Dialysis catheter care

EXAM:
PORTABLE CHEST 1 VIEW

[AP]
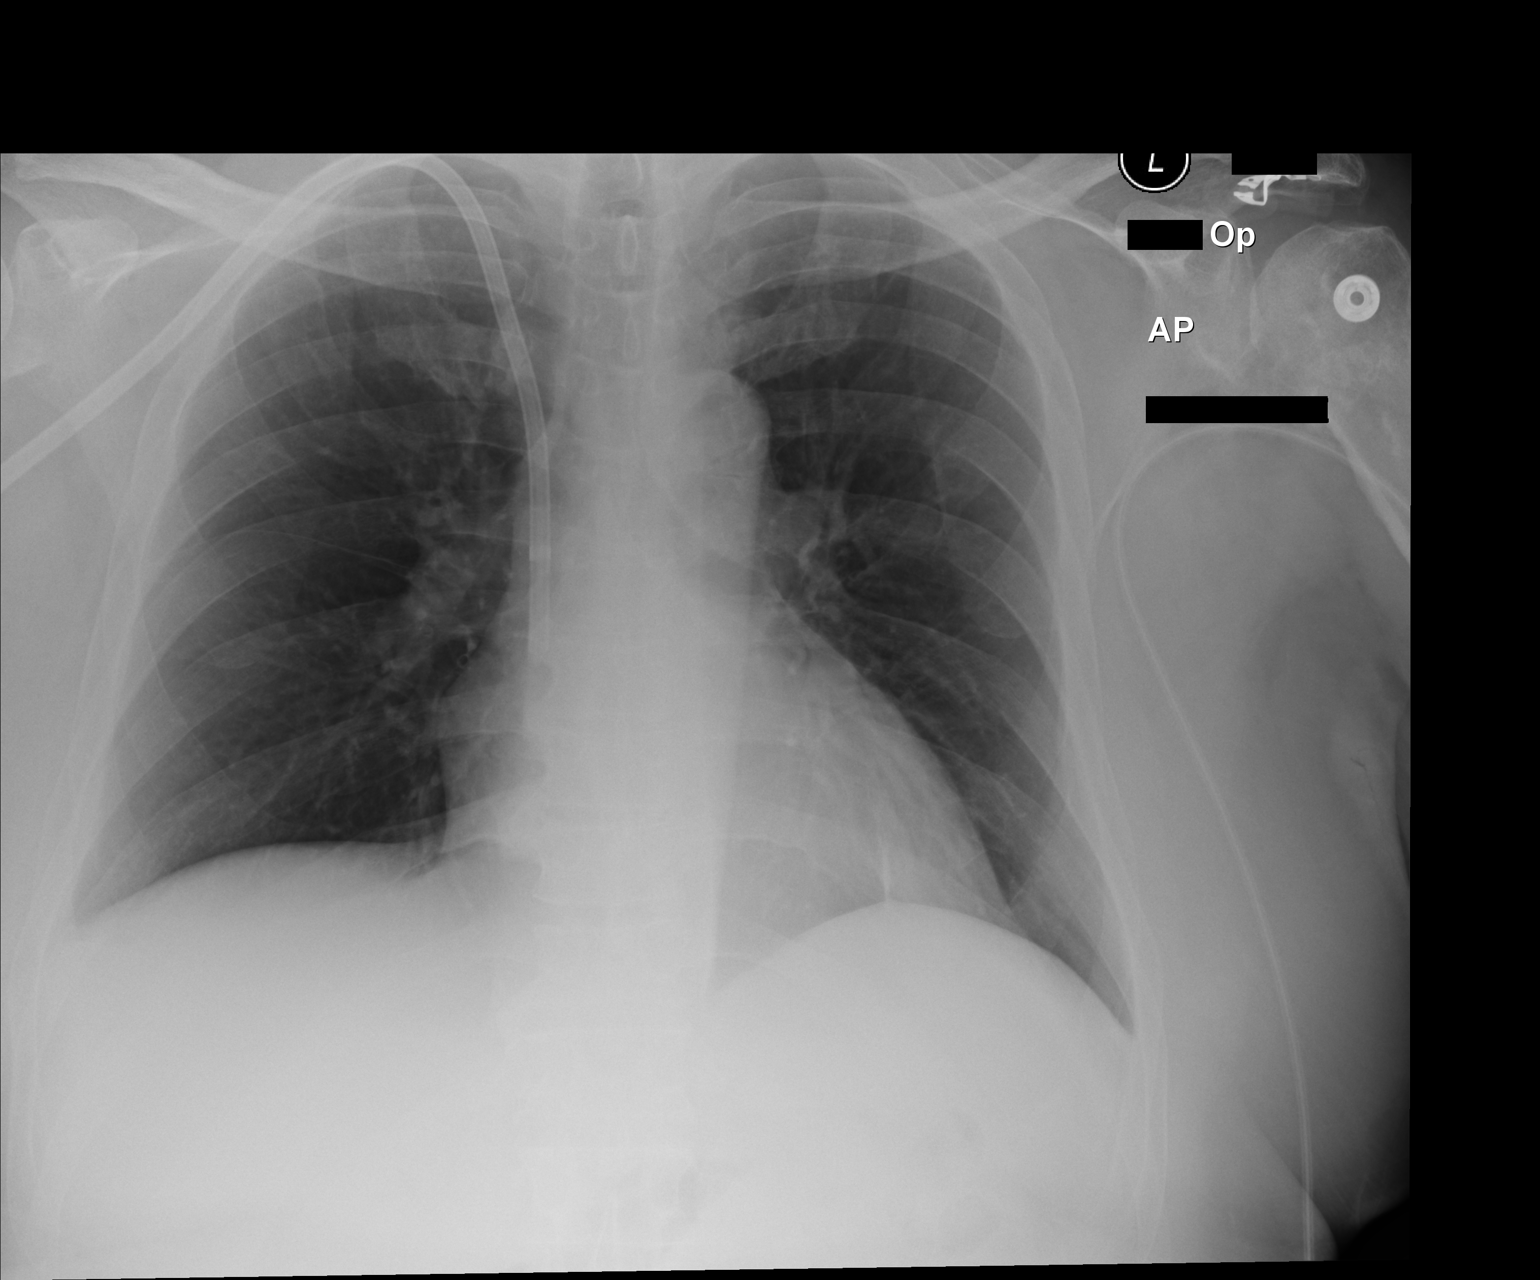

[1 of 1 positions shown; findings below may reference images not displayed]

FINDINGS: Right dialysis catheter is in place with the tip at the cavoatrial
junction. Heart is normal size. Lungs are clear. No pneumothorax or
effusion. No acute bony abnormality.
IMPRESSION: Right dialysis catheter tip at the cavoatrial junction.

No active disease.

## 2017-03-02 ENCOUNTER — Telehealth: Payer: Self-pay | Admitting: Vascular Surgery

## 2017-03-02 ENCOUNTER — Encounter (HOSPITAL_COMMUNITY): Payer: Self-pay | Admitting: *Deleted

## 2017-03-02 ENCOUNTER — Emergency Department (HOSPITAL_COMMUNITY)
Admission: EM | Admit: 2017-03-02 | Discharge: 2017-03-02 | Disposition: A | Discharge status: Home / Self Care | Payer: BLUE CROSS/BLUE SHIELD | Attending: Emergency Medicine | Admitting: Emergency Medicine

## 2017-03-02 ENCOUNTER — Ambulatory Visit (HOSPITAL_COMMUNITY): Payer: Non-veteran care | Admitting: Certified Registered Nurse Anesthetist

## 2017-03-02 ENCOUNTER — Emergency Department (HOSPITAL_COMMUNITY): Payer: BLUE CROSS/BLUE SHIELD

## 2017-03-02 ENCOUNTER — Ambulatory Visit (HOSPITAL_COMMUNITY)
Admission: RE | Admit: 2017-03-02 | Discharge: 2017-03-02 | Disposition: A | Discharge status: Home / Self Care | Payer: Non-veteran care | Source: Ambulatory Visit | Attending: Vascular Surgery | Admitting: Vascular Surgery

## 2017-03-02 ENCOUNTER — Encounter (HOSPITAL_COMMUNITY): Payer: Self-pay | Admitting: Emergency Medicine

## 2017-03-02 ENCOUNTER — Encounter (HOSPITAL_COMMUNITY)
Admission: RE | Disposition: A | Discharge status: Home / Self Care | Payer: Self-pay | Source: Ambulatory Visit | Attending: Vascular Surgery

## 2017-03-02 DIAGNOSIS — I120 Hypertensive chronic kidney disease with stage 5 chronic kidney disease or end stage renal disease: Secondary | ICD-10-CM | POA: Diagnosis not present

## 2017-03-02 DIAGNOSIS — Z8673 Personal history of transient ischemic attack (TIA), and cerebral infarction without residual deficits: Secondary | ICD-10-CM | POA: Insufficient documentation

## 2017-03-02 DIAGNOSIS — N186 End stage renal disease: Secondary | ICD-10-CM

## 2017-03-02 DIAGNOSIS — Z85528 Personal history of other malignant neoplasm of kidney: Secondary | ICD-10-CM | POA: Insufficient documentation

## 2017-03-02 DIAGNOSIS — Y92410 Unspecified street and highway as the place of occurrence of the external cause: Secondary | ICD-10-CM | POA: Diagnosis not present

## 2017-03-02 DIAGNOSIS — Z992 Dependence on renal dialysis: Secondary | ICD-10-CM

## 2017-03-02 DIAGNOSIS — Z79899 Other long term (current) drug therapy: Secondary | ICD-10-CM | POA: Diagnosis not present

## 2017-03-02 DIAGNOSIS — Y9389 Activity, other specified: Secondary | ICD-10-CM | POA: Insufficient documentation

## 2017-03-02 DIAGNOSIS — E669 Obesity, unspecified: Secondary | ICD-10-CM | POA: Diagnosis not present

## 2017-03-02 DIAGNOSIS — Z7982 Long term (current) use of aspirin: Secondary | ICD-10-CM | POA: Insufficient documentation

## 2017-03-02 DIAGNOSIS — T82868A Thrombosis of vascular prosthetic devices, implants and grafts, initial encounter: Secondary | ICD-10-CM | POA: Insufficient documentation

## 2017-03-02 DIAGNOSIS — Z6830 Body mass index (BMI) 30.0-30.9, adult: Secondary | ICD-10-CM | POA: Insufficient documentation

## 2017-03-02 DIAGNOSIS — Z91018 Allergy to other foods: Secondary | ICD-10-CM | POA: Diagnosis not present

## 2017-03-02 DIAGNOSIS — Y999 Unspecified external cause status: Secondary | ICD-10-CM | POA: Diagnosis not present

## 2017-03-02 DIAGNOSIS — Y832 Surgical operation with anastomosis, bypass or graft as the cause of abnormal reaction of the patient, or of later complication, without mention of misadventure at the time of the procedure: Secondary | ICD-10-CM | POA: Diagnosis not present

## 2017-03-02 DIAGNOSIS — S6391XA Sprain of unspecified part of right wrist and hand, initial encounter: Secondary | ICD-10-CM | POA: Diagnosis not present

## 2017-03-02 DIAGNOSIS — S6991XA Unspecified injury of right wrist, hand and finger(s), initial encounter: Secondary | ICD-10-CM | POA: Diagnosis present

## 2017-03-02 HISTORY — DX: Dependence on renal dialysis: Z99.2

## 2017-03-02 HISTORY — PX: THROMBECTOMY AND REVISION OF ARTERIOVENTOUS (AV) GORETEX  GRAFT: SHX6120

## 2017-03-02 SURGERY — THROMBECTOMY AND REVISION OF ARTERIOVENTOUS (AV) GORETEX  GRAFT
Anesthesia: General | Site: Arm Upper | Laterality: Left

## 2017-03-02 MED ORDER — DEXTROSE 5 % IV SOLN
1.5000 g | INTRAVENOUS | Status: DC
  Filled 2017-03-02: qty 1.5

## 2017-03-02 MED ORDER — FENTANYL CITRATE (PF) 100 MCG/2ML IJ SOLN
INTRAMUSCULAR | Status: AC
  Administered 2017-03-02: 25 ug via INTRAVENOUS
  Filled 2017-03-02: qty 2

## 2017-03-02 MED ORDER — SODIUM CHLORIDE 0.9 % IV SOLN
INTRAVENOUS | Status: DC
  Administered 2017-03-02 (×2): via INTRAVENOUS

## 2017-03-02 MED ORDER — 0.9 % SODIUM CHLORIDE (POUR BTL) OPTIME
TOPICAL | Status: DC | PRN
  Administered 2017-03-02: 1000 mL

## 2017-03-02 MED ORDER — OXYCODONE-ACETAMINOPHEN 5-325 MG PO TABS
ORAL_TABLET | ORAL | Status: AC
  Filled 2017-03-02: qty 1

## 2017-03-02 MED ORDER — FENTANYL CITRATE (PF) 250 MCG/5ML IJ SOLN
INTRAMUSCULAR | Status: AC
  Filled 2017-03-02: qty 5

## 2017-03-02 MED ORDER — PHENYLEPHRINE HCL 10 MG/ML IJ SOLN
INTRAMUSCULAR | Status: DC | PRN
Start: 2017-03-02 — End: 2017-03-02
  Administered 2017-03-02 (×2): 80 ug via INTRAVENOUS

## 2017-03-02 MED ORDER — PROPOFOL 10 MG/ML IV BOLUS
INTRAVENOUS | Status: DC | PRN
  Administered 2017-03-02: 150 mg via INTRAVENOUS

## 2017-03-02 MED ORDER — FENTANYL CITRATE (PF) 100 MCG/2ML IJ SOLN
INTRAMUSCULAR | Status: DC | PRN
  Administered 2017-03-02: 50 ug via INTRAVENOUS

## 2017-03-02 MED ORDER — CYCLOBENZAPRINE HCL 10 MG PO TABS: 10.0000 mg | ORAL_TABLET | Freq: Two times a day (BID) | ORAL | 0 refills | Status: DC | PRN

## 2017-03-02 MED ORDER — ARGATROBAN 1 MG/ML SYRINGE FOR VASCULAR SURGERY
10.0000 mg | INTRAVENOUS | Status: AC
  Administered 2017-03-02: 10 mg via INTRAVENOUS
  Filled 2017-03-02: qty 50

## 2017-03-02 MED ORDER — LIDOCAINE HCL (PF) 1 % IJ SOLN: INTRAMUSCULAR | Status: DC | PRN

## 2017-03-02 MED ORDER — OXYCODONE-ACETAMINOPHEN 5-325 MG PO TABS: 1.0000 | ORAL_TABLET | Freq: Four times a day (QID) | ORAL | 0 refills | Status: DC | PRN

## 2017-03-02 MED ORDER — OXYCODONE-ACETAMINOPHEN 5-325 MG PO TABS
1.0000 | ORAL_TABLET | Freq: Once | ORAL | Status: AC
  Administered 2017-03-02: 1 via ORAL

## 2017-03-02 MED ORDER — ONDANSETRON HCL 4 MG/2ML IJ SOLN: 4.0000 mg | Freq: Once | INTRAMUSCULAR | Status: DC | PRN

## 2017-03-02 MED ORDER — FENTANYL CITRATE (PF) 100 MCG/2ML IJ SOLN
25.0000 ug | INTRAMUSCULAR | Status: DC | PRN
  Administered 2017-03-02: 25 ug via INTRAVENOUS

## 2017-03-02 MED ORDER — MIDAZOLAM HCL 2 MG/2ML IJ SOLN
INTRAMUSCULAR | Status: AC
  Filled 2017-03-02: qty 2

## 2017-03-02 MED ORDER — CARVEDILOL 12.5 MG PO TABS
ORAL_TABLET | ORAL | Status: AC
  Administered 2017-03-02: 12.5 mg via ORAL
  Filled 2017-03-02: qty 1

## 2017-03-02 MED ORDER — CHLORHEXIDINE GLUCONATE CLOTH 2 % EX PADS: 6.0000 | MEDICATED_PAD | Freq: Once | CUTANEOUS | Status: DC

## 2017-03-02 MED ORDER — HYDROCODONE-ACETAMINOPHEN 5-325 MG PO TABS: 1.0000 | ORAL_TABLET | Freq: Four times a day (QID) | ORAL | 0 refills | Status: DC | PRN

## 2017-03-02 MED ORDER — ONDANSETRON HCL 4 MG/2ML IJ SOLN
INTRAMUSCULAR | Status: DC | PRN
  Administered 2017-03-02: 4 mg via INTRAVENOUS

## 2017-03-02 MED ORDER — LIDOCAINE HCL (CARDIAC) 20 MG/ML IV SOLN
INTRAVENOUS | Status: DC | PRN
  Administered 2017-03-02: 60 mg via INTRAVENOUS

## 2017-03-02 MED ORDER — LIDOCAINE HCL (PF) 1 % IJ SOLN
INTRAMUSCULAR | Status: AC
  Filled 2017-03-02: qty 30

## 2017-03-02 MED ORDER — PHENYLEPHRINE HCL 10 MG/ML IJ SOLN
INTRAVENOUS | Status: DC | PRN
  Administered 2017-03-02: 40 ug/min via INTRAVENOUS

## 2017-03-02 SURGICAL SUPPLY — 40 items
ARMBAND PINK RESTRICT EXTREMIT (MISCELLANEOUS) ×6 IMPLANT
CANISTER SUCT 3000ML PPV (MISCELLANEOUS) ×3 IMPLANT
CANNULA VESSEL 3MM 2 BLNT TIP (CANNULA) IMPLANT
CATH EMB 4FR 80CM (CATHETERS) ×3 IMPLANT
CLIP TI MEDIUM 6 (CLIP) ×3 IMPLANT
CLIP TI WIDE RED SMALL 6 (CLIP) ×3 IMPLANT
DECANTER SPIKE VIAL GLASS SM (MISCELLANEOUS) ×3 IMPLANT
DERMABOND ADVANCED (GAUZE/BANDAGES/DRESSINGS) ×2
DERMABOND ADVANCED .7 DNX12 (GAUZE/BANDAGES/DRESSINGS) ×1 IMPLANT
DRAPE X-RAY CASS 24X20 (DRAPES) IMPLANT
ELECT REM PT RETURN 9FT ADLT (ELECTROSURGICAL) ×3
ELECTRODE REM PT RTRN 9FT ADLT (ELECTROSURGICAL) ×1 IMPLANT
GLOVE BIO SURGEON STRL SZ 6.5 (GLOVE) ×4 IMPLANT
GLOVE BIO SURGEON STRL SZ7.5 (GLOVE) ×3 IMPLANT
GLOVE BIO SURGEONS STRL SZ 6.5 (GLOVE) ×2
GLOVE BIOGEL PI IND STRL 6.5 (GLOVE) ×2 IMPLANT
GLOVE BIOGEL PI IND STRL 7.0 (GLOVE) ×2 IMPLANT
GLOVE BIOGEL PI INDICATOR 6.5 (GLOVE) ×4
GLOVE BIOGEL PI INDICATOR 7.0 (GLOVE) ×4
GLOVE SKINSENSE NS SZ7.0 (GLOVE) ×2
GLOVE SKINSENSE STRL SZ7.0 (GLOVE) ×1 IMPLANT
GOWN STRL REUS W/ TWL LRG LVL3 (GOWN DISPOSABLE) ×3 IMPLANT
GOWN STRL REUS W/TWL LRG LVL3 (GOWN DISPOSABLE) ×6
HEMOSTAT SPONGE AVITENE ULTRA (HEMOSTASIS) IMPLANT
KIT BASIN OR (CUSTOM PROCEDURE TRAY) ×3 IMPLANT
KIT ROOM TURNOVER OR (KITS) ×3 IMPLANT
LOOP VESSEL MINI RED (MISCELLANEOUS) IMPLANT
NS IRRIG 1000ML POUR BTL (IV SOLUTION) ×3 IMPLANT
PACK CV ACCESS (CUSTOM PROCEDURE TRAY) ×3 IMPLANT
PAD ARMBOARD 7.5X6 YLW CONV (MISCELLANEOUS) ×6 IMPLANT
SET COLLECT BLD 21X3/4 12 (NEEDLE) IMPLANT
STOPCOCK 4 WAY LG BORE MALE ST (IV SETS) IMPLANT
SUT PROLENE 5 0 C 1 24 (SUTURE) ×3 IMPLANT
SUT PROLENE 6 0 CC (SUTURE) ×3 IMPLANT
SUT VIC AB 3-0 SH 27 (SUTURE) ×2
SUT VIC AB 3-0 SH 27X BRD (SUTURE) ×1 IMPLANT
SUT VICRYL 4-0 PS2 18IN ABS (SUTURE) ×3 IMPLANT
TUBING EXTENTION W/L.L. (IV SETS) IMPLANT
UNDERPAD 30X30 (UNDERPADS AND DIAPERS) ×3 IMPLANT
WATER STERILE IRR 1000ML POUR (IV SOLUTION) ×3 IMPLANT

## 2017-03-02 NOTE — Interval H&P Note (Signed)
History and Physical Interval Note:  03/02/2017 9:48 AM  Allen Miller  has presented today for surgery, with the diagnosis of End Stage Renal Disease N18.6  The various methods of treatment have been discussed with the patient and family. After consideration of risks, benefits and other options for treatment, the patient has consented to  Procedure(s): THROMBECTOMY AND REVISION OF ARTERIOVENTOUS (AV) GORETEX  GRAFT (Left) as a surgical intervention .  The patient's history has been reviewed, patient examined, no change in status, stable for surgery.  I have reviewed the patient's chart and labs.  Questions were answered to the patient's satisfaction.     Ruta Hinds

## 2017-03-02 NOTE — H&P (View-Only) (Signed)
History of Present Illness:  Patient is a 61 y.o. year old male who presents for placement of a permanent hemodialysis access. He has had three separate attempts at access so far.  First left radiocephalic, left basilic vein transposition and finally leading up to left UE af graft.  The graft was most recently revised by Dr. Donnetta Hutching.  Thrombectomy of left upper arm AV Gore-Tex graft and revision with interposition 7 mm Gore-Tex to the axillary vein.  He was sent to [facility] for revision again of the left UE AV graft 2 weeks ago for occluded graft.  This attempt failed and now he is on HD M-W-F via right IJ catheter.    He is here for revision verses new access options.  He reports no fever,chills, N/V/D.  No medication changes since his last visit 12/14/2016.    Past Medical History:  Diagnosis Date  . Cancer [facility])    kidney cancer  . ESRD (end stage renal disease) ([place])    MWF  . Hypertension   . Low iron   . Shortness of breath dyspnea    when lying flat  . Stroke [facility])    effective vision- Left eye    Past Surgical History:  Procedure Laterality Date  . AV FISTULA PLACEMENT Left 10/27/2016   Procedure: INSERTION LEFT UPPER ARM  OF ARTERIOVENOUS (AV) GORE-TEX GRAFT ARM;  Surgeon: Conrad [place], MD;  Location: [place];  Service: Vascular;  Laterality: Left;  . BASCILIC VEIN TRANSPOSITION Left 02/24/2016   Procedure: BASILIC VEIN TRANSPOSITION VERSUS ARTERIOVENOUS GRAFT INSERTION;  Surgeon: [name], MD;  Location: [place];  Service: Vascular;  Laterality: Left;  . BASCILIC VEIN TRANSPOSITION Left 07/14/2016   Procedure: LEFT BASILIC VEIN TRANSPOSITION;  Surgeon: Conrad [place], MD;  Location: [place];  Service: Vascular;  Laterality: Left;  . BASCILIC VEIN TRANSPOSITION Left 09/08/2016   Procedure: SECOND STAGE BASILIC VEIN TRANSPOSITION LEFT UPPER ARM;  Surgeon: Conrad [place], MD;  Location: [place];  Service: Vascular;  Laterality: Left;  . INSERTION OF DIALYSIS CATHETER N/A 02/24/2016   Procedure:  INSERTION OF DIALYSIS CATHETER;  Surgeon: [name], MD;  Location: [place];  Service: Vascular;  Laterality: N/A;  . INSERTION OF DIALYSIS CATHETER Right 10/27/2016   Procedure: EXCHANGE OF RIGHT INTERNAL JUGULAR DIALYSIS CATHETER;  Surgeon: Conrad [place], MD;  Location: [place];  Service: Vascular;  Laterality: Right;  . THROMBECTOMY W/ EMBOLECTOMY Left 12/14/2016   Procedure: THROMBECTOMY OF LEFT  ARTERIO-VENOUS GORTEX GRAFT; REVISION OF LEFT  ARTERIO-VENOUS GORE-TEX GRAFT;  Surgeon: [name], MD;  Location: [facility] OR;  Service: Vascular;  Laterality: Left;     Social History Social History  Substance Use Topics  . Smoking status: Never Smoker  . Smokeless tobacco: Never Used  . Alcohol use No    Family History No family history on file.  Allergies  Allergies  Allergen Reactions  . Pork-Derived Products Other (See Comments)    Pt does not eat pork  . Ppd [Tuberculin Purified Protein Derivative] Rash     Current Outpatient Prescriptions  Medication Sig Dispense Refill  . amLODipine (NORVASC) 10 MG tablet Take 1 tablet (10 mg total) by mouth daily. 30 tablet 0  . aspirin 81 MG chewable tablet Chew 1 tablet (81 mg total) by mouth daily. 30 tablet 5  . B Complex Vitamins (B-COMPLEX/B-12) LIQD Place 2 sprays under the tongue daily.    . calcium acetate (PHOSLO) 667 MG capsule Take  667 mg by mouth 3 (three) times daily with meals.   6  . carvedilol (COREG) 12.5 MG tablet Take 1 tablet (12.5 mg total) by mouth 2 (two) times daily with a meal. 60 tablet 0  . furosemide (LASIX) 80 MG tablet Take 1 tablet (80 mg total) by mouth daily.    . hydrALAZINE (APRESOLINE) 100 MG tablet Take 0.5 tablets (50 mg total) by mouth 2 (two) times daily.    Marland Kitchen losartan-hydrochlorothiazide (HYZAAR) 100-25 MG tablet TAKE 1 TAB BY MOUTH ONCE A DAY.  2  . oxyCODONE-acetaminophen (PERCOCET/ROXICET) 5-325 MG tablet Take 1 tablet by mouth every 6 (six) hours as needed. (Patient not taking: Reported on 02/25/2017)  10 tablet 0   No current facility-administered medications for this visit.     ROS:   General:  No weight loss, Fever, chills  HEENT: No recent headaches, no nasal bleeding, no visual changes, no sore throat  Neurologic: No dizziness, blackouts, seizures. No recent symptoms of stroke or mini- stroke. No recent episodes of slurred speech, or temporary blindness.  Cardiac: No recent episodes of chest pain/pressure, no shortness of breath at rest.  No shortness of breath with exertion.  Denies history of atrial fibrillation or irregular heartbeat  Vascular: No history of rest pain in feet.  No history of claudication.  No history of non-healing ulcer, No history of DVT   Pulmonary: No home oxygen, no productive cough, no hemoptysis,  No asthma or wheezing  Musculoskeletal:  [ ]  Arthritis, [ ]  Low back pain,  [ ]  Joint pain  Hematologic:No history of hypercoagulable state.  No history of easy bleeding.  No history of anemia  Gastrointestinal: No hematochezia or melena,  No gastroesophageal reflux, no trouble swallowing  Urinary: [ ]  chronic Kidney disease, [ x] on HD - [x ] MWF or [ ]  TTHS, [ ]  Burning with urination, [ ]  Frequent urination, [ ]  Difficulty urinating;   Skin: No rashes  Psychological: No history of anxiety,  No history of depression   Physical Examination  Vitals:   02/25/17 1309  BP: 139/84  Pulse: 84  Resp: 16  Temp: 99.4 F (37.4 C)  Weight: 184 lb (83.5 kg)  Height: 5\' 4"  (1.626 m)    Body mass index is 31.58 kg/m.  General:  Alert and oriented, no acute distress HEENT: Normal Neck: No bruit or JVD Pulmonary: Clear to auscultation bilaterally Cardiac: Regular Rate and Rhythm without murmur Gastrointestinal: Soft, non-tender, non-distended, no mass, no scars Skin: No rash Extremity Pulses:  2+ radial, brachial pulses bilaterally, left graft without thrill Musculoskeletal: No deformity or edema  Neurologic: Upper and lower extremity motor 5/5 and  symmetric    ASSESSMENT:  ESRD with occluded left UE AV graft   PLAN: We are going to schedule him for revision/thrombectomy of the left AV graft on 03/02/2017 by Dr. Oneida Alar.  We discussed that if this does not work we may have to consider using his right arm for access.  He is very adamant that he does not want any surgery on his right arm or the groins.  We will have to cross that bridge when we come to it.    Theda Sers, [facility] PA-C [facility] [place]   MD in clinic: Dr. Oneida Alar

## 2017-03-02 NOTE — ED Notes (Signed)
ED Provider at bedside. 

## 2017-03-02 NOTE — Telephone Encounter (Signed)
Scheduled 4/19 @ 11am. Confirmed with patient .
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
## 2017-03-02 NOTE — Anesthesia Preprocedure Evaluation (Signed)
Anesthesia Evaluation  Patient identified by MRN, date of birth, ID band Patient awake    Reviewed: Allergy & Precautions, NPO status , Patient's Chart, lab work & pertinent test results, reviewed documented beta blocker date and time   History of Anesthesia Complications Negative for: history of anesthetic complications  Airway Mallampati: II  TM Distance: >3 FB Neck ROM: Full    Dental  (+) Teeth Intact, Dental Advisory Given   Pulmonary    Pulmonary exam normal breath sounds clear to auscultation       Cardiovascular hypertension, Pt. on home beta blockers and Pt. on medications Normal cardiovascular exam Rhythm:Regular Rate:Normal  Echo 01/23/16 Study Conclusions  - Left ventricle: The cavity size was normal. Wall thickness was increased in a pattern of moderate to severe LVH. Systolic function was normal. The estimated ejection fraction was in the range of 50% to 55%. Wall motion was normal; there were no regional wall motion abnormalities. Doppler parameters are consistent with a reversible restrictive pattern, indicative of decreased left ventricular diastolic compliance and/or increased   left atrial pressure (grade 3 diastolic dysfunction). - Aortic valve: There was mild regurgitation. - Mitral valve: There was mild regurgitation. - Left atrium: The atrium was moderately dilated. - Pericardium, extracardiac: A small to moderate pericardial effusion was identified circumferential to the heart.   Neuro/Psych CVA, Residual Symptoms negative psych ROS   GI/Hepatic negative GI ROS, Neg liver ROS,   Endo/Other  Obesity   Renal/GU ESRF and DialysisRenal disease (MWF)K+      Musculoskeletal negative musculoskeletal ROS (+)   Abdominal   Peds  Hematology  (+) Blood dyscrasia, anemia ,   Anesthesia Other Findings Day of surgery medications reviewed with the patient.  Reproductive/Obstetrics                              Anesthesia Physical  Anesthesia Plan  ASA: III  Anesthesia Plan: General   Post-op Pain Management:    Induction: Intravenous  Airway Management Planned: LMA  Additional Equipment:   Intra-op Plan:   Post-operative Plan: Extubation in OR  Informed Consent: I have reviewed the patients History and Physical, chart, labs and discussed the procedure including the risks, benefits and alternatives for the proposed anesthesia with the patient or authorized representative who has indicated his/her understanding and acceptance.   Dental advisory given  Plan Discussed with: CRNA  Anesthesia Plan Comments:         Anesthesia Quick Evaluation
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
## 2017-03-02 NOTE — Anesthesia Procedure Notes (Signed)
Procedure Name: LMA Insertion Date/Time: 03/02/2017 10:16 AM Performed by: Carney Living Pre-anesthesia Checklist: Patient identified, Emergency Drugs available, Suction available, Patient being monitored and Timeout performed Patient Re-evaluated:Patient Re-evaluated prior to inductionOxygen Delivery Method: Circle system utilized Preoxygenation: Pre-oxygenation with 100% oxygen Intubation Type: IV induction LMA: LMA inserted LMA Size: 5.0 Number of attempts: 1 Placement Confirmation: positive ETCO2 and breath sounds checked- equal and bilateral Tube secured with: Tape Dental Injury: Teeth and Oropharynx as per pre-operative assessment

## 2017-03-02 NOTE — Transfer of Care (Signed)
Immediate Anesthesia Transfer of Care Note  Patient: Allen Miller  Procedure(s) Performed: Procedure(s): THROMBECTOMY AND REVISION OF ARTERIOVENous left arm GORETEX  GRAFT (Left)  Patient Location: PACU  Anesthesia Type:General  Level of Consciousness: awake, alert , oriented and patient cooperative  Airway & Oxygen Therapy: Patient Spontanous Breathing and Patient connected to nasal cannula oxygen  Post-op Assessment: Report given to RN, Post -op Vital signs reviewed and stable and Patient moving all extremities X 4  Post vital signs: Reviewed and stable  Last Vitals:  Vitals:   03/02/17 0845  BP: 140/80  Pulse: 82  Resp: 18  Temp: 36.8 C    Last Pain:  Vitals:   03/02/17 0845  TempSrc: Oral      Patients Stated Pain Goal: 4 (34/96/11 6435)  Complications: No apparent anesthesia complications

## 2017-03-02 NOTE — Telephone Encounter (Signed)
-----   Message from Mena Goes, RN sent at 03/02/2017 11:28 AM EDT ----- Regarding: 2 weeks w/ CEF to discuss surgery   ----- Message ----- From: [name], PA-C Sent: 03/02/2017  11:22 AM To: Vvs Charge Pool  s/p ligation left upper arm graft 03/02/17  Needs appt with Dr. Oneida Alar in 2 weeks and right arm vein mapping to discuss new access.   Thanks Maudie Mercury

## 2017-03-02 NOTE — ED Notes (Signed)
Pt A&OX4, ambulatory at d/c with steady gait, NAD and states he has all of his belongings with him at d/c

## 2017-03-02 NOTE — Op Note (Signed)
Procedure: Attempted thrombectomy left upper arm AV graft  Preoperative diagnosis: Thrombosed AV graft left arm the third postoperative diagnosis: Same  Anesthesia: Gen.  Assistant: Pervis Hocking PA-C  Operative findings: Nonsalvageable left upper arm graft secondary to occlusion of axillary venous stent  Operative details: After obtaining informed consent, the patient taken the operator. The patient was placed in supine position operating table. After induction of general anesthesia and endotracheal ablation, the patient's entire left upper extremity was prepped and draped in usual sterile fashion. Next a longitudinal incision was made in the pre-existing scar in the axilla. The incision was carried down through the subcutaneous tissues down to level of an AV graft. This was thrombosed. It was dissected free circumferentially. I then proceeded to dissect down on to the level of the venous anastomosis. The vein was approximately 7 mm in diameter. However was very firm on palpation suggestive of a possible stent within the vein.  At this point the patient was given a bolus of argatroban for anticoagulation. Transverse graftotomy was made just above the level of the anastomosis. I immediately encountered metal struts of the stent. I tried to remove the stent but it extended deep into the axillary vein and I was unable to retrieve all of the portions of this. Therefore the graft was transected the distal end oversewn with a running 5-0 Prolene suture. The proximal end was ligated with a 2-0 silk tie. Hemostasis was obtained. The second cutaneous tissues were reapproximated using a running 3-0 Vicryl suture. The skin was closed with 4 Vicryl subcuticular stitch. The patient tolerated the procedure well and there were no complications. Instrument sponge and needle count was correct at the end of the case. The patient was taken to the recovery room in stable condition.  Ruta Hinds, MD [facility] [place] Office: [phone] Pager: [phone]

## 2017-03-03 ENCOUNTER — Encounter (HOSPITAL_COMMUNITY): Payer: Self-pay | Admitting: Vascular Surgery

## 2017-03-03 LAB — POCT I-STAT 4, (NA,K, GLUC, HGB,HCT)
Glucose, Bld: 123 mg/dL — ABNORMAL HIGH (ref 65–99)
HCT: 35 % — ABNORMAL LOW (ref 39.0–52.0)
Hemoglobin: 11.9 g/dL — ABNORMAL LOW (ref 13.0–17.0)
Potassium: 4.7 mmol/L (ref 3.5–5.1)
Sodium: 136 mmol/L (ref 135–145)

## 2017-03-03 NOTE — Anesthesia Postprocedure Evaluation (Signed)
Anesthesia Post Note  Patient: Allen Miller  Procedure(s) Performed: Procedure(s) (LRB): THROMBECTOMY AND REVISION OF ARTERIOVENous left arm GORETEX  GRAFT (Left)  Patient location during evaluation: PACU Anesthesia Type: General Level of consciousness: awake and alert Pain management: pain level controlled Vital Signs Assessment: post-procedure vital signs reviewed and stable Respiratory status: spontaneous breathing, nonlabored ventilation, respiratory function stable and patient connected to nasal cannula oxygen Cardiovascular status: blood pressure returned to baseline and stable Postop Assessment: no signs of nausea or vomiting Anesthetic complications: no       Last Vitals:  Vitals:   03/02/17 1155 03/02/17 1245  BP: 109/76 125/84  Pulse: 65 69  Resp: (!) 8 (!) 9  Temp:  36.3 C    Last Pain:  Vitals:   03/02/17 1245  TempSrc:   PainSc: 3                  Catalina Gravel

## 2017-03-03 NOTE — Addendum Note (Signed)
Addended by: Lianne Cure A on: 03/03/2017 09:33 AM   Modules accepted: Orders

## 2017-03-08 ENCOUNTER — Encounter: Payer: Self-pay | Admitting: Vascular Surgery

## 2017-03-18 ENCOUNTER — Ambulatory Visit (HOSPITAL_COMMUNITY)
Admission: RE | Admit: 2017-03-18 | Discharge: 2017-03-18 | Disposition: A | Discharge status: Home / Self Care | Payer: Non-veteran care | Source: Ambulatory Visit | Attending: Vascular Surgery | Admitting: Vascular Surgery

## 2017-03-18 ENCOUNTER — Encounter: Payer: Self-pay | Admitting: Vascular Surgery

## 2017-03-18 ENCOUNTER — Encounter (HOSPITAL_COMMUNITY): Payer: Self-pay | Admitting: *Deleted

## 2017-03-18 ENCOUNTER — Other Ambulatory Visit: Payer: Self-pay

## 2017-03-18 ENCOUNTER — Ambulatory Visit (INDEPENDENT_AMBULATORY_CARE_PROVIDER_SITE_OTHER): Payer: Non-veteran care | Admitting: Vascular Surgery

## 2017-03-18 VITALS — BP 176/98 | HR 69 | Temp 98.0°F | Resp 16 | Ht 64.0 in | Wt 186.0 lb

## 2017-03-18 DIAGNOSIS — Z992 Dependence on renal dialysis: Secondary | ICD-10-CM | POA: Diagnosis not present

## 2017-03-18 DIAGNOSIS — N186 End stage renal disease: Secondary | ICD-10-CM | POA: Diagnosis not present

## 2017-03-18 NOTE — Progress Notes (Signed)
Pt denies SOB, chest pain, and being under the care of a cardiologist. Pt denies having a stress test and cardiac cath. Pt stated that he no longer takes Norvasc, Aspirin and Hydrocodone. Pt verbalized understanding of all pre-op instructions.

## 2017-03-18 NOTE — Progress Notes (Signed)
History of Present Illness:  Patient is a 61 y.o. year old male who presents for placement of a permanent hemodialysis access. He has had three separate attempts at access so far.  First left radiocephalic, left basilic vein transposition and finally leading up to left UE af graft.   attempt was made to thrombectomize the graft recently but due to several stents no revision was possible.  This attempt failed and now he is on HD M-W-F via right IJ catheter.               He is here for revision verses new access options.  He reports no fever,chills, N/V/D.  No medication changes since his last visit 12/14/2016.  He reports no drainage from his left axillary incision.  Current Outpatient Prescriptions on File Prior to Visit  Medication Sig Dispense Refill  . amLODipine (NORVASC) 10 MG tablet Take 1 tablet (10 mg total) by mouth daily. 30 tablet 0  . aspirin 81 MG chewable tablet Chew 1 tablet (81 mg total) by mouth daily. 30 tablet 5  . B Complex Vitamins (B-COMPLEX/B-12) LIQD Place 2 sprays under the tongue daily.    . calcium acetate (PHOSLO) 667 MG capsule Take 667 mg by mouth 3 (three) times daily with meals.   6  . carvedilol (COREG) 12.5 MG tablet Take 1 tablet (12.5 mg total) by mouth 2 (two) times daily with a meal. 60 tablet 0  . cyclobenzaprine (FLEXERIL) 10 MG tablet Take 1 tablet (10 mg total) by mouth 2 (two) times daily as needed for muscle spasms. 20 tablet 0  . furosemide (LASIX) 80 MG tablet Take 1 tablet (80 mg total) by mouth daily.    . hydrALAZINE (APRESOLINE) 100 MG tablet Take 0.5 tablets (50 mg total) by mouth 2 (two) times daily.    Marland Kitchen HYDROcodone-acetaminophen (NORCO/VICODIN) 5-325 MG tablet Take 1-2 tablets by mouth every 6 (six) hours as needed. 12 tablet 0  . losartan-hydrochlorothiazide (HYZAAR) 100-25 MG tablet TAKE 1 TAB BY MOUTH ONCE A DAY.  2   No current facility-administered medications on file prior to visit.      Past Medical History:  Diagnosis Date  .  Cancer [facility])    kidney cancer  . ESRD (end stage renal disease) ([place])    MWF  . Hemodialysis patient ([place])   . Hypertension   . Low iron   . Shortness of breath dyspnea    when lying flat  . Stroke [facility])    effective vision- Left eye      Past Surgical History:  Procedure Laterality Date  . AV FISTULA PLACEMENT Left 10/27/2016   Procedure: INSERTION LEFT UPPER ARM  OF ARTERIOVENOUS (AV) GORE-TEX GRAFT ARM;  Surgeon: Conrad Force, MD;  Location: [place];  Service: Vascular;  Laterality: Left;  . BASCILIC VEIN TRANSPOSITION Left 02/24/2016   Procedure: BASILIC VEIN TRANSPOSITION VERSUS ARTERIOVENOUS GRAFT INSERTION;  Surgeon: [name], MD;  Location: [place];  Service: Vascular;  Laterality: Left;  . BASCILIC VEIN TRANSPOSITION Left 07/14/2016   Procedure: LEFT BASILIC VEIN TRANSPOSITION;  Surgeon: Conrad [place], MD;  Location: [place];  Service: Vascular;  Laterality: Left;  . BASCILIC VEIN TRANSPOSITION Left 09/08/2016   Procedure: SECOND STAGE BASILIC VEIN TRANSPOSITION LEFT UPPER ARM;  Surgeon: Conrad Montrose, MD;  Location: [place];  Service: Vascular;  Laterality: Left;  . INSERTION OF DIALYSIS CATHETER N/A 02/24/2016   Procedure: INSERTION OF DIALYSIS CATHETER;  Surgeon: [name], MD;  [facility];  Service: Vascular;  Laterality: N/A;  . INSERTION OF DIALYSIS CATHETER Right 10/27/2016   Procedure: EXCHANGE OF RIGHT INTERNAL JUGULAR DIALYSIS CATHETER;  Surgeon: Conrad Lely, MD;  Location: [place];  Service: Vascular;  Laterality: Right;  . THROMBECTOMY AND REVISION OF ARTERIOVENTOUS (AV) GORETEX  GRAFT Left 03/02/2017   Procedure: THROMBECTOMY AND REVISION OF ARTERIOVENous left arm GORETEX  GRAFT;  Surgeon: Elam Dutch, MD;  Location: [place];  Service: Vascular;  Laterality: Left;  . THROMBECTOMY W/ EMBOLECTOMY Left 12/14/2016   Procedure: THROMBECTOMY OF LEFT  ARTERIO-VENOUS GORTEX GRAFT; REVISION OF LEFT  ARTERIO-VENOUS GORE-TEX GRAFT;  Surgeon: [name], MD;  Location: [facility] OR;   Service: Vascular;  Laterality: Left;    Review of systems: He denies shortness of breath. He denies chest pain.  Physical exam:  Vitals:   03/18/17 1143  BP: (!) 176/98  Pulse: 69  Resp: 16  Temp: 98 F (36.7 C)  TempSrc: Oral  SpO2: 98%  Weight: 186 lb (84.4 kg)  Height: 5\' 4"  (1.626 m)    Extremities: 2+ right radial pulse, well-healed left axillary incision  Neck: Right side dialysis catheter  Chest: Clear to auscultation bilaterally  Cardiac: Regular rate and rhythm without murmur  Data: Patient had a vein mapping ultrasound today of his right upper extremity. This shows the cephalic vein is 3 mm at the antecubital region but thickened and possibly sclerotic. The basilic vein is too small to be considered.  Assessment: Patient needs new hemodialysis access.  Plan: I will explore the patient's right cephalic vein and possibly create a right brachiocephalic AV fistula versus placement of a right upper arm graft 03/23/2017. Risks benefits possible complications and procedure details were discussed the patient today including but not limited to bleeding infection graft thrombosis possibility of further access procedures possibility of ischemic steal. He understands and agrees to proceed.  Ruta Hinds, MD [facility] [place] Office: [phone] Pager: [phone]

## 2017-03-23 ENCOUNTER — Ambulatory Visit (HOSPITAL_COMMUNITY): Payer: Non-veteran care | Admitting: Anesthesiology

## 2017-03-23 ENCOUNTER — Telehealth: Payer: Self-pay | Admitting: Vascular Surgery

## 2017-03-23 ENCOUNTER — Encounter (HOSPITAL_COMMUNITY)
Admission: RE | Disposition: A | Discharge status: Home / Self Care | Payer: Self-pay | Source: Ambulatory Visit | Attending: Vascular Surgery

## 2017-03-23 ENCOUNTER — Encounter (HOSPITAL_COMMUNITY): Payer: Self-pay | Admitting: *Deleted

## 2017-03-23 ENCOUNTER — Ambulatory Visit (HOSPITAL_COMMUNITY)
Admission: RE | Admit: 2017-03-23 | Discharge: 2017-03-23 | Disposition: A | Discharge status: Home / Self Care | Payer: Non-veteran care | Source: Ambulatory Visit | Attending: Vascular Surgery | Admitting: Vascular Surgery

## 2017-03-23 DIAGNOSIS — Z7982 Long term (current) use of aspirin: Secondary | ICD-10-CM | POA: Diagnosis not present

## 2017-03-23 DIAGNOSIS — Z992 Dependence on renal dialysis: Secondary | ICD-10-CM | POA: Insufficient documentation

## 2017-03-23 DIAGNOSIS — Z85528 Personal history of other malignant neoplasm of kidney: Secondary | ICD-10-CM | POA: Diagnosis not present

## 2017-03-23 DIAGNOSIS — N186 End stage renal disease: Secondary | ICD-10-CM | POA: Insufficient documentation

## 2017-03-23 DIAGNOSIS — I120 Hypertensive chronic kidney disease with stage 5 chronic kidney disease or end stage renal disease: Secondary | ICD-10-CM | POA: Diagnosis not present

## 2017-03-23 DIAGNOSIS — Z8673 Personal history of transient ischemic attack (TIA), and cerebral infarction without residual deficits: Secondary | ICD-10-CM | POA: Insufficient documentation

## 2017-03-23 DIAGNOSIS — N185 Chronic kidney disease, stage 5: Secondary | ICD-10-CM | POA: Diagnosis not present

## 2017-03-23 HISTORY — PX: AV FISTULA PLACEMENT: SHX1204

## 2017-03-23 LAB — POCT I-STAT 4, (NA,K, GLUC, HGB,HCT)
GLUCOSE: 115 mg/dL — AB (ref 65–99)
HCT: 34 % — ABNORMAL LOW (ref 39.0–52.0)
Hemoglobin: 11.6 g/dL — ABNORMAL LOW (ref 13.0–17.0)
Potassium: 3.9 mmol/L (ref 3.5–5.1)
Sodium: 136 mmol/L (ref 135–145)

## 2017-03-23 SURGERY — ARTERIOVENOUS (AV) FISTULA CREATION
Anesthesia: General | Site: Arm Upper | Laterality: Right

## 2017-03-23 MED ORDER — EPHEDRINE SULFATE 50 MG/ML IJ SOLN
INTRAMUSCULAR | Status: DC | PRN
  Administered 2017-03-23 (×2): 5 mg via INTRAVENOUS

## 2017-03-23 MED ORDER — PROPOFOL 500 MG/50ML IV EMUL
INTRAVENOUS | Status: DC | PRN
  Administered 2017-03-23: 50 ug/kg/min via INTRAVENOUS

## 2017-03-23 MED ORDER — PROPOFOL 10 MG/ML IV BOLUS
INTRAVENOUS | Status: DC | PRN
  Administered 2017-03-23: 20 mg via INTRAVENOUS
  Administered 2017-03-23: 30 mg via INTRAVENOUS

## 2017-03-23 MED ORDER — LIDOCAINE HCL (PF) 1 % IJ SOLN
INTRAMUSCULAR | Status: DC | PRN
  Administered 2017-03-23: 5 mL

## 2017-03-23 MED ORDER — ONDANSETRON HCL 4 MG/2ML IJ SOLN
INTRAMUSCULAR | Status: AC
  Filled 2017-03-23: qty 2

## 2017-03-23 MED ORDER — SODIUM CHLORIDE 0.9 % IV SOLN
0.2500 mg/kg/h | INTRAVENOUS | Status: AC
  Administered 2017-03-23: 250 mg via INTRAVENOUS
  Filled 2017-03-23: qty 250

## 2017-03-23 MED ORDER — OXYCODONE-ACETAMINOPHEN 5-325 MG PO TABS: 1.0000 | ORAL_TABLET | Freq: Four times a day (QID) | ORAL | 0 refills | Status: DC | PRN

## 2017-03-23 MED ORDER — LIDOCAINE HCL 1 % IJ SOLN
INTRAMUSCULAR | Status: AC
  Filled 2017-03-23: qty 20

## 2017-03-23 MED ORDER — SODIUM CHLORIDE 0.9 % IV SOLN
INTRAVENOUS | Status: DC
  Administered 2017-03-23: 12:00:00 via INTRAVENOUS
  Administered 2017-03-23: 30 mL/h via INTRAVENOUS
  Administered 2017-03-23 (×2): via INTRAVENOUS

## 2017-03-23 MED ORDER — 0.9 % SODIUM CHLORIDE (POUR BTL) OPTIME
TOPICAL | Status: DC | PRN
  Administered 2017-03-23: 1000 mL

## 2017-03-23 MED ORDER — FENTANYL CITRATE (PF) 250 MCG/5ML IJ SOLN
INTRAMUSCULAR | Status: AC
  Filled 2017-03-23: qty 5

## 2017-03-23 MED ORDER — MIDAZOLAM HCL 2 MG/2ML IJ SOLN
INTRAMUSCULAR | Status: AC
  Filled 2017-03-23: qty 2

## 2017-03-23 MED ORDER — ONDANSETRON HCL 4 MG/2ML IJ SOLN: 4.0000 mg | Freq: Once | INTRAMUSCULAR | Status: DC | PRN

## 2017-03-23 MED ORDER — PHENYLEPHRINE HCL 10 MG/ML IJ SOLN
INTRAMUSCULAR | Status: DC | PRN
  Administered 2017-03-23 (×2): 80 ug via INTRAVENOUS
  Administered 2017-03-23: 40 ug via INTRAVENOUS

## 2017-03-23 MED ORDER — DEXTROSE 5 % IV SOLN
1.5000 g | INTRAVENOUS | Status: AC
  Administered 2017-03-23: 1.5 g via INTRAVENOUS

## 2017-03-23 MED ORDER — CHLORHEXIDINE GLUCONATE CLOTH 2 % EX PADS: 6.0000 | MEDICATED_PAD | Freq: Once | CUTANEOUS | Status: DC

## 2017-03-23 MED ORDER — FENTANYL CITRATE (PF) 100 MCG/2ML IJ SOLN: 25.0000 ug | INTRAMUSCULAR | Status: DC | PRN

## 2017-03-23 MED ORDER — DEXTROSE 5 % IV SOLN
INTRAVENOUS | Status: AC
  Filled 2017-03-23: qty 1.5

## 2017-03-23 MED ORDER — MIDAZOLAM HCL 5 MG/5ML IJ SOLN
INTRAMUSCULAR | Status: DC | PRN
  Administered 2017-03-23: 2 mg via INTRAVENOUS

## 2017-03-23 SURGICAL SUPPLY — 28 items
ARMBAND PINK RESTRICT EXTREMIT (MISCELLANEOUS) ×3 IMPLANT
CANISTER SUCT 3000ML PPV (MISCELLANEOUS) ×3 IMPLANT
CANNULA VESSEL 3MM 2 BLNT TIP (CANNULA) ×3 IMPLANT
CLIP TI MEDIUM 6 (CLIP) ×3 IMPLANT
CLIP TI WIDE RED SMALL 6 (CLIP) ×3 IMPLANT
COVER PROBE W GEL 5X96 (DRAPES) IMPLANT
DECANTER SPIKE VIAL GLASS SM (MISCELLANEOUS) IMPLANT
DERMABOND ADVANCED (GAUZE/BANDAGES/DRESSINGS) ×2
DERMABOND ADVANCED .7 DNX12 (GAUZE/BANDAGES/DRESSINGS) ×1 IMPLANT
DRAIN PENROSE 1/4X12 LTX STRL (WOUND CARE) ×3 IMPLANT
ELECT REM PT RETURN 9FT ADLT (ELECTROSURGICAL) ×3
ELECTRODE REM PT RTRN 9FT ADLT (ELECTROSURGICAL) ×1 IMPLANT
GLOVE BIO SURGEON STRL SZ7.5 (GLOVE) ×3 IMPLANT
GOWN STRL REUS W/ TWL LRG LVL3 (GOWN DISPOSABLE) ×3 IMPLANT
GOWN STRL REUS W/TWL LRG LVL3 (GOWN DISPOSABLE) ×6
KIT BASIN OR (CUSTOM PROCEDURE TRAY) ×3 IMPLANT
KIT ROOM TURNOVER OR (KITS) ×3 IMPLANT
LOOP VESSEL MINI RED (MISCELLANEOUS) IMPLANT
NS IRRIG 1000ML POUR BTL (IV SOLUTION) ×3 IMPLANT
PACK CV ACCESS (CUSTOM PROCEDURE TRAY) ×3 IMPLANT
PAD ARMBOARD 7.5X6 YLW CONV (MISCELLANEOUS) ×6 IMPLANT
SPONGE SURGIFOAM ABS GEL 100 (HEMOSTASIS) IMPLANT
SUT PROLENE 7 0 BV 1 (SUTURE) ×3 IMPLANT
SUT VIC AB 3-0 SH 27 (SUTURE) ×2
SUT VIC AB 3-0 SH 27X BRD (SUTURE) ×1 IMPLANT
SUT VICRYL 4-0 PS2 18IN ABS (SUTURE) ×3 IMPLANT
UNDERPAD 30X30 (UNDERPADS AND DIAPERS) ×3 IMPLANT
WATER STERILE IRR 1000ML POUR (IV SOLUTION) ×3 IMPLANT

## 2017-03-23 NOTE — Anesthesia Preprocedure Evaluation (Addendum)
Anesthesia Evaluation  Patient identified by MRN, date of birth, ID band Patient awake    Reviewed: Allergy & Precautions, NPO status , Patient's Chart, lab work & pertinent test results, reviewed documented beta blocker date and time   History of Anesthesia Complications Negative for: history of anesthetic complications  Airway Mallampati: II  TM Distance: >3 FB Neck ROM: Full    Dental  (+) Teeth Intact, Dental Advisory Given   Pulmonary neg pulmonary ROS,    Pulmonary exam normal breath sounds clear to auscultation       Cardiovascular hypertension, Pt. on home beta blockers and Pt. on medications Normal cardiovascular exam Rhythm:Regular Rate:Normal  Echo 01/23/16 Study Conclusions  - Left ventricle: The cavity size was normal. Wall thickness was increased in a pattern of moderate to severe LVH. Systolic function was normal. The estimated ejection fraction was in the range of 50% to 55%. Wall motion was normal; there were no regional wall motion abnormalities. Doppler parameters are consistent with a reversible restrictive pattern, indicative of decreased left ventricular diastolic compliance and/or increased   left atrial pressure (grade 3 diastolic dysfunction). - Aortic valve: There was mild regurgitation. - Mitral valve: There was mild regurgitation. - Left atrium: The atrium was moderately dilated. - Pericardium, extracardiac: A small to moderate pericardial effusion was identified circumferential to the heart.   Neuro/Psych CVA, Residual Symptoms negative psych ROS   GI/Hepatic negative GI ROS, Neg liver ROS,   Endo/Other  negative endocrine ROSObesity   Renal/GU ESRF and DialysisRenal disease (MWF)K+      Musculoskeletal negative musculoskeletal ROS (+)   Abdominal   Peds  Hematology  (+) Blood dyscrasia, anemia ,   Anesthesia Other Findings Day of surgery medications reviewed with the patient.  Reproductive/Obstetrics                             Anesthesia Physical  Anesthesia Plan  ASA: III  Anesthesia Plan: MAC   Post-op Pain Management:    Induction: Intravenous  Airway Management Planned: Simple Face Mask  Additional Equipment:   Intra-op Plan:   Post-operative Plan:   Informed Consent: I have reviewed the patients History and Physical, chart, labs and discussed the procedure including the risks, benefits and alternatives for the proposed anesthesia with the patient or authorized representative who has indicated his/her understanding and acceptance.   Dental advisory given  Plan Discussed with: CRNA  Anesthesia Plan Comments: (Discussed risks/benefits/alternatives to MAC sedation including need for ventilatory support, hypotension, need for conversion to general anesthesia.  All patient questions answered.  Patient/guardian wishes to proceed.)       Anesthesia Quick Evaluation

## 2017-03-23 NOTE — Anesthesia Postprocedure Evaluation (Signed)
Anesthesia Post Note  Patient: Allen Miller  Procedure(s) Performed: Procedure(s) (LRB): ARTERIOVENOUS (AV) FISTULA CREATION (Right)  Patient location during evaluation: PACU Anesthesia Type: General Level of consciousness: awake and alert Pain management: pain level controlled Vital Signs Assessment: post-procedure vital signs reviewed and stable Respiratory status: spontaneous breathing, nonlabored ventilation, respiratory function stable and patient connected to nasal cannula oxygen Cardiovascular status: blood pressure returned to baseline and stable Postop Assessment: no signs of nausea or vomiting Anesthetic complications: no       Last Vitals:  Vitals:   03/23/17 1245 03/23/17 1252  BP:    Pulse: 95   Resp: 16   Temp:  36.6 C    Last Pain:  Vitals:   03/23/17 1252  TempSrc:   PainSc: 0-No pain                 Catalina Gravel

## 2017-03-23 NOTE — Op Note (Signed)
Procedure: Right Brachial Cephalic AV fistula  Preop: ESRD  Postop: ESRD  Anesthesia: General  Assistant: Nurse  Findings: 3.5 mm cephalic vein  Procedure: After obtaining informed consent, the patient was taken to the operating room.  After induction of general anesthesia, the right upper extremity was prepped and draped in usual sterile fashion.  A transverse incision was then made near the antecubital crease the right arm. The incision was carried into the subcutaneous tissues down to level of the cephalic vein. The cephalic vein was approximately 3.5 mm in diameter. It was of good quality. This was dissected free circumferentially and small side branches ligated and divided between silk ties or clips. Next the brachial artery was dissected free in the medial portion of the incision. The artery was  3-4 mm in diameter. The vessel loops were placed proximal and distal to the planned site of arteriotomy. The patient was given an angiomax bolus since he did not want any pork derived heparin. After appropriate circulation time, the vessel loops were used to control the artery. A longitudinal opening was made in the brachial artery.  The vein was ligated distally with a 2-0 silk tie. The vein was controlled proximally with a fine bulldog clamp. The vein was then swung over to the artery and sewn end of vein to side of artery using a running 7-0 Prolene suture. Just prior to completion of the anastomosis, everything was fore bled back bled and thoroughly flushed. The anastomosis was secured, vessel loops released, and there was a palpable thrill in the fistula immediately. After hemostasis was obtained, the subcutaneous tissues were reapproximated using a running 3-0 Vicryl suture. The skin was then closed with a 4 0 Vicryl subcuticular stitch. Dermabond was applied to the skin incision.    Allen Hinds, MD [facility] [place] Office: [phone] Pager: [phone]

## 2017-03-23 NOTE — Transfer of Care (Signed)
Immediate Anesthesia Transfer of Care Note  Patient: Allen Miller  Procedure(s) Performed: Procedure(s): ARTERIOVENOUS (AV) FISTULA CREATION (Right)  Patient Location: PACU  Anesthesia Type:General  Level of Consciousness: awake, alert , oriented and sedated  Airway & Oxygen Therapy: Patient Spontanous Breathing and Patient connected to nasal cannula oxygen  Post-op Assessment: Report given to RN, Post -op Vital signs reviewed and stable and Patient moving all extremities  Post vital signs: Reviewed and stable  Last Vitals:  Vitals:   03/23/17 0819 03/23/17 1153  BP: 130/66   Pulse: 100   Resp: 18   Temp: 37.2 C 36.6 C    Last Pain:  Vitals:   03/23/17 0819  TempSrc: Oral         Complications: No apparent anesthesia complications

## 2017-03-23 NOTE — Telephone Encounter (Signed)
Dialysis duplex scheduled 6/7 @ 3:30p and office visit on 6/14 @ 1pm.

## 2017-03-23 NOTE — Interval H&P Note (Signed)
History and Physical Interval Note:  03/23/2017 9:32 AM  Allen Miller  has presented today for surgery, with the diagnosis of End stage renal disease N18.6  The various methods of treatment have been discussed with the patient and family. After consideration of risks, benefits and other options for treatment, the patient has consented to  Procedure(s): ARTERIOVENOUS (AV) FISTULA CREATION VERSUS GRAFT INSERTION (Right) as a surgical intervention .  The patient's history has been reviewed, patient examined, no change in status, stable for surgery.  I have reviewed the patient's chart and labs.  Questions were answered to the patient's satisfaction.     Ruta Hinds

## 2017-03-23 NOTE — H&P (View-Only) (Signed)
History of Present Illness:  Patient is a 61 y.o. year old male who presents for placement of a permanent hemodialysis access. He has had three separate attempts at access so far.  First left radiocephalic, left basilic vein transposition and finally leading up to left UE af graft.   attempt was made to thrombectomize the graft recently but due to several stents no revision was possible.  This attempt failed and now he is on HD M-W-F via right IJ catheter.               He is here for revision verses new access options.  He reports no fever,chills, N/V/D.  No medication changes since his last visit 12/14/2016.  He reports no drainage from his left axillary incision.  Current Outpatient Prescriptions on File Prior to Visit  Medication Sig Dispense Refill  . amLODipine (NORVASC) 10 MG tablet Take 1 tablet (10 mg total) by mouth daily. 30 tablet 0  . aspirin 81 MG chewable tablet Chew 1 tablet (81 mg total) by mouth daily. 30 tablet 5  . B Complex Vitamins (B-COMPLEX/B-12) LIQD Place 2 sprays under the tongue daily.    . calcium acetate (PHOSLO) 667 MG capsule Take 667 mg by mouth 3 (three) times daily with meals.   6  . carvedilol (COREG) 12.5 MG tablet Take 1 tablet (12.5 mg total) by mouth 2 (two) times daily with a meal. 60 tablet 0  . cyclobenzaprine (FLEXERIL) 10 MG tablet Take 1 tablet (10 mg total) by mouth 2 (two) times daily as needed for muscle spasms. 20 tablet 0  . furosemide (LASIX) 80 MG tablet Take 1 tablet (80 mg total) by mouth daily.    . hydrALAZINE (APRESOLINE) 100 MG tablet Take 0.5 tablets (50 mg total) by mouth 2 (two) times daily.    Marland Kitchen HYDROcodone-acetaminophen (NORCO/VICODIN) 5-325 MG tablet Take 1-2 tablets by mouth every 6 (six) hours as needed. 12 tablet 0  . losartan-hydrochlorothiazide (HYZAAR) 100-25 MG tablet TAKE 1 TAB BY MOUTH ONCE A DAY.  2   No current facility-administered medications on file prior to visit.      Past Medical History:  Diagnosis Date  .  Cancer [facility])    kidney cancer  . ESRD (end stage renal disease) ([place])    MWF  . Hemodialysis patient ([place])   . Hypertension   . Low iron   . Shortness of breath dyspnea    when lying flat  . Stroke [facility])    effective vision- Left eye      Past Surgical History:  Procedure Laterality Date  . AV FISTULA PLACEMENT Left 10/27/2016   Procedure: INSERTION LEFT UPPER ARM  OF ARTERIOVENOUS (AV) GORE-TEX GRAFT ARM;  Surgeon: Conrad Vista, MD;  Location: [place];  Service: Vascular;  Laterality: Left;  . BASCILIC VEIN TRANSPOSITION Left 02/24/2016   Procedure: BASILIC VEIN TRANSPOSITION VERSUS ARTERIOVENOUS GRAFT INSERTION;  Surgeon: [name], MD;  Location: [place];  Service: Vascular;  Laterality: Left;  . BASCILIC VEIN TRANSPOSITION Left 07/14/2016   Procedure: LEFT BASILIC VEIN TRANSPOSITION;  Surgeon: Conrad [place], MD;  Location: [place];  Service: Vascular;  Laterality: Left;  . BASCILIC VEIN TRANSPOSITION Left 09/08/2016   Procedure: SECOND STAGE BASILIC VEIN TRANSPOSITION LEFT UPPER ARM;  Surgeon: Conrad Colo, MD;  Location: [place];  Service: Vascular;  Laterality: Left;  . INSERTION OF DIALYSIS CATHETER N/A 02/24/2016   Procedure: INSERTION OF DIALYSIS CATHETER;  Surgeon: [name], MD;  Location: [facility];  Service: Vascular;  Laterality: N/A;  . INSERTION OF DIALYSIS CATHETER Right 10/27/2016   Procedure: EXCHANGE OF RIGHT INTERNAL JUGULAR DIALYSIS CATHETER;  Surgeon: Conrad Hanover, MD;  Location: [place];  Service: Vascular;  Laterality: Right;  . THROMBECTOMY AND REVISION OF ARTERIOVENTOUS (AV) GORETEX  GRAFT Left 03/02/2017   Procedure: THROMBECTOMY AND REVISION OF ARTERIOVENous left arm GORETEX  GRAFT;  Surgeon: Elam Dutch, MD;  Location: [place];  Service: Vascular;  Laterality: Left;  . THROMBECTOMY W/ EMBOLECTOMY Left 12/14/2016   Procedure: THROMBECTOMY OF LEFT  ARTERIO-VENOUS GORTEX GRAFT; REVISION OF LEFT  ARTERIO-VENOUS GORE-TEX GRAFT;  Surgeon: [name], MD;  [facility];   Service: Vascular;  Laterality: Left;    Review of systems: He denies shortness of breath. He denies chest pain.  Physical exam:  Vitals:   03/18/17 1143  BP: (!) 176/98  Pulse: 69  Resp: 16  Temp: 98 F (36.7 C)  TempSrc: Oral  SpO2: 98%  Weight: 186 lb (84.4 kg)  Height: 5\' 4"  (1.626 m)    Extremities: 2+ right radial pulse, well-healed left axillary incision  Neck: Right side dialysis catheter  Chest: Clear to auscultation bilaterally  Cardiac: Regular rate and rhythm without murmur  Data: Patient had a vein mapping ultrasound today of his right upper extremity. This shows the cephalic vein is 3 mm at the antecubital region but thickened and possibly sclerotic. The basilic vein is too small to be considered.  Assessment: Patient needs new hemodialysis access.  Plan: I will explore the patient's right cephalic vein and possibly create a right brachiocephalic AV fistula versus placement of a right upper arm graft 03/23/2017. Risks benefits possible complications and procedure details were discussed the patient today including but not limited to bleeding infection graft thrombosis possibility of further access procedures possibility of ischemic steal. He understands and agrees to proceed.  Ruta Hinds, MD [facility] [place] Office: [phone] Pager: [phone]

## 2017-03-23 NOTE — Anesthesia Procedure Notes (Signed)
Procedure Name: LMA Insertion Date/Time: 03/23/2017 10:45 AM Performed by: Scheryl Darter Pre-anesthesia Checklist: Patient identified, Emergency Drugs available, Suction available and Patient being monitored Patient Re-evaluated:Patient Re-evaluated prior to inductionOxygen Delivery Method: Circle System Utilized Preoxygenation: Pre-oxygenation with 100% oxygen Intubation Type: IV induction Ventilation: Mask ventilation without difficulty LMA: LMA inserted LMA Size: 4.0 Number of attempts: 1 Airway Equipment and Method: Bite block Placement Confirmation: positive ETCO2 Tube secured with: Tape Dental Injury: Teeth and Oropharynx as per pre-operative assessment

## 2017-03-23 NOTE — Telephone Encounter (Signed)
-----   Message from Mena Goes, RN sent at 03/23/2017  1:18 PM EDT ----- Regarding: 4-6 weeks   ----- Message ----- From: Elam Dutch, MD Sent: 03/23/2017  11:39 AM To: Vvs Charge Pool  Pt needs follow up appt in 4-6 weeks with duplex of fistula.  [facility] is fine  Ruta Hinds

## 2017-03-24 ENCOUNTER — Encounter (HOSPITAL_COMMUNITY): Payer: Self-pay | Admitting: Vascular Surgery

## 2017-05-01 NOTE — Addendum Note (Signed)
Addendum  created 05/01/17 0815 by Keylie Beavers, MD   Sign clinical note    

## 2017-05-03 ENCOUNTER — Encounter: Payer: Self-pay | Admitting: Vascular Surgery

## 2017-05-03 NOTE — Addendum Note (Signed)
Addended by: Lianne Cure A on: 05/03/2017 09:59 AM   Modules accepted: Orders

## 2017-05-06 ENCOUNTER — Encounter (HOSPITAL_COMMUNITY): Payer: Non-veteran care

## 2017-05-06 ENCOUNTER — Encounter: Payer: Non-veteran care | Admitting: Vascular Surgery

## 2017-05-26 ENCOUNTER — Telehealth: Payer: Self-pay | Admitting: Vascular Surgery

## 2017-05-26 NOTE — Telephone Encounter (Signed)
I left message for Keri regarding rescheduling this patient's appointment for 06/17/17 to see Dr.Fields for a post op visit regarding his AVF. She states the AVF is non-maturing. I rescheduled the appt for 06/17/17 at 12:30pm for vascular study and to see CEF at 1:30pm and left a message for Keri regarding this appt. She will contact the patient as well. awt

## 2017-05-26 NOTE — Telephone Encounter (Signed)
-----   Message from Mena Goes, RN sent at 05/26/2017 11:02 AM EDT ----- Regarding: RE: Appt Question Contact: [phone] Yes that's fine ----- Message ----- From: Lujean Amel Sent: 05/26/2017  10:11 AM To: Mena Goes, RN Subject: Appt Question                                  Charlton Haws from [facility] called again to inquire about this patient. She states the patient's AVF is not maturing and wants him to come back in to see CEF for this. I rescheduled his post op to the 06/14/17 that was cancelled earlier because the note stated the [place] was following him. Please advise if this is okay and I can call Keri back to notify her of the appt. Thanks, Anne Ng

## 2017-06-09 ENCOUNTER — Encounter: Payer: Self-pay | Admitting: Vascular Surgery

## 2017-06-10 ENCOUNTER — Other Ambulatory Visit: Payer: Self-pay

## 2017-06-10 DIAGNOSIS — Z992 Dependence on renal dialysis: Principal | ICD-10-CM

## 2017-06-10 DIAGNOSIS — N186 End stage renal disease: Secondary | ICD-10-CM

## 2017-06-17 ENCOUNTER — Encounter: Payer: Non-veteran care | Admitting: Vascular Surgery

## 2017-06-17 ENCOUNTER — Encounter (HOSPITAL_COMMUNITY): Payer: Non-veteran care

## 2017-06-17 ENCOUNTER — Ambulatory Visit (INDEPENDENT_AMBULATORY_CARE_PROVIDER_SITE_OTHER): Payer: Self-pay | Admitting: Vascular Surgery

## 2017-06-17 ENCOUNTER — Encounter: Payer: Self-pay | Admitting: Vascular Surgery

## 2017-06-17 ENCOUNTER — Ambulatory Visit (HOSPITAL_COMMUNITY)
Admission: RE | Admit: 2017-06-17 | Discharge: 2017-06-17 | Disposition: A | Discharge status: Home / Self Care | Payer: Non-veteran care | Source: Ambulatory Visit | Attending: Vascular Surgery | Admitting: Vascular Surgery

## 2017-06-17 VITALS — BP 166/89 | HR 82 | Temp 97.0°F | Resp 18 | Ht 64.0 in | Wt 184.0 lb

## 2017-06-17 DIAGNOSIS — N186 End stage renal disease: Secondary | ICD-10-CM

## 2017-06-17 DIAGNOSIS — Z992 Dependence on renal dialysis: Secondary | ICD-10-CM | POA: Diagnosis not present

## 2017-06-17 NOTE — Progress Notes (Signed)
POST OPERATIVE OFFICE NOTE    CC:  F/u for surgery  HPI:  This is a 61 y.o. male who is s/p right brachial cephalic AV fistula on 1/88/41 by Dr. Oneida Alar.  He returns today for follow up to check on his fistula maturation.    He states that he has some mild numbness in his right 4th and 5th fingers, but otherwise, no pain in his hand.  He is on dialysis and dialyzes M/W/F at the [facility].  He is unsure of the doctor's name.  Allergies  Allergen Reactions  . Pork-Derived Products Other (See Comments)    NOT AN ALLERGY > RELIGIOUS REASONS  . Ppd [Tuberculin Purified Protein Derivative] Rash    Current Outpatient Prescriptions  Medication Sig Dispense Refill  . amLODipine (NORVASC) 10 MG tablet Take 1 tablet (10 mg total) by mouth daily. 30 tablet 0  . Ascorbic Acid (VITAMIN C PO) Take 1 tablet by mouth daily.    . B Complex Vitamins (B-COMPLEX/B-12) LIQD Place 2 sprays under the tongue daily.    . calcium acetate (PHOSLO) 667 MG capsule Take 2,001 mg by mouth 3 (three) times daily with meals.   6  . aspirin 81 MG chewable tablet Chew 1 tablet (81 mg total) by mouth daily. (Patient not taking: Reported on 03/23/2017) 30 tablet 5  . carvedilol (COREG) 12.5 MG tablet Take 1 tablet (12.5 mg total) by mouth 2 (two) times daily with a meal. (Patient not taking: Reported on 06/17/2017) 60 tablet 0  . cyclobenzaprine (FLEXERIL) 10 MG tablet Take 1 tablet (10 mg total) by mouth 2 (two) times daily as needed for muscle spasms. (Patient not taking: Reported on 03/23/2017) 20 tablet 0  . dorzolamide-timolol (COSOPT) 22.3-6.8 MG/ML ophthalmic solution Place 1 drop into the left eye 2 times daily.    . furosemide (LASIX) 80 MG tablet Take 1 tablet (80 mg total) by mouth daily. (Patient not taking: Reported on 03/23/2017)    . hydrALAZINE (APRESOLINE) 100 MG tablet Take 0.5 tablets (50 mg total) by mouth 2 (two) times daily. (Patient not taking: Reported on 03/23/2017)    . HYDROcodone-acetaminophen  (NORCO/VICODIN) 5-325 MG tablet Take 1-2 tablets by mouth every 6 (six) hours as needed. (Patient not taking: Reported on 03/23/2017) 12 tablet 0  . losartan-hydrochlorothiazide (HYZAAR) 100-25 MG tablet TAKE 1 TAB BY MOUTH ONCE A DAY.  2  . oxyCODONE-acetaminophen (PERCOCET/ROXICET) 5-325 MG tablet Take 1-2 tablets by mouth every 6 (six) hours as needed. (Patient not taking: Reported on 06/17/2017) 6 tablet 0   No current facility-administered medications for this visit.      ROS:  See HPI  Physical Exam:  Vitals:   06/17/17 1341 06/17/17 1346  BP: (!) 151/79 (!) 166/89  Pulse: 82 82  Resp: 18   Temp: (!) 97 F (36.1 C)     Incision:  Well healed Extremities:  +palpable thrill in fistula as well as bruit  Dialysis fistula duplex 06/17/17: Diameter:  0.38cm-0.53cm Depth:  0.39cm-0.73cm  Narrowing of the mid outflow vein with increase in velocity.  No retained valve or thrombus noted.   Assessment/Plan:  This is a 61 y.o. male who is s/p: right brachial cephalic AV fistula 6/60/63 by Dr. Oneida Alar   -pt's duplex today reveals that he may have a narrowing in the mid cephalic vein.  Dr. Oneida Alar has recommended fistulogram of the right upper arm fistula to evaluate fistula as it is marginal at this point.  He has had failed left  BVT and left arm graft x 2 revisions in the past.  If unable to get fistula to mature, may require a a graft in the right arm.  This was discussed with pt. -he dialyzes M/W/F.  Will schedule for 06/25/17 and have his dialysis moved to Saturday 06/26/17 if possible.  If this does not work out, may need to be scheduled on a Tuesday or Thursday by one of Dr. Oneida Alar partners.   -pt is on a daily aspirin -he is scheduled to have eye surgery on June 24, 2017.   Leontine Locket, PA-C [facility] [phone]  Clinic MD:  Pt seen and examined with Dr. Oneida Alar  History and exam details as above. Patient's fistula has not matured sufficiently for  cannulation at this point. We will schedule him for a fistulogram in the near future. Possible intervention with balloon angioplasty of possible. If the fistula does not really have any mechanical reason for non-maturation we may need to consider him for an AV graft. Risks benefits possible palpitations and procedure details the fistula gram were explained the patient today including but not limited to bleeding infection possible injury to the fistula he understands and agrees to proceed.  Ruta Hinds, MD [facility] [place] Office: [phone] Pager: [phone]

## 2017-06-17 NOTE — Progress Notes (Signed)
Vitals:   06/17/17 1341  BP: (!) 151/79  Pulse: 82  Resp: 18  Temp: (!) 97 F (36.1 C)  SpO2: 97%  Weight: 184 lb (83.5 kg)  Height: 5\' 4"  (1.626 m)

## 2017-06-18 ENCOUNTER — Other Ambulatory Visit: Payer: Self-pay

## 2017-06-25 ENCOUNTER — Encounter (HOSPITAL_COMMUNITY): Payer: Self-pay | Admitting: Vascular Surgery

## 2017-06-25 ENCOUNTER — Ambulatory Visit (HOSPITAL_COMMUNITY)
Admission: RE | Admit: 2017-06-25 | Discharge: 2017-06-25 | Disposition: A | Discharge status: Home / Self Care | Payer: Non-veteran care | Source: Ambulatory Visit | Attending: Vascular Surgery | Admitting: Vascular Surgery

## 2017-06-25 ENCOUNTER — Other Ambulatory Visit: Payer: Self-pay

## 2017-06-25 ENCOUNTER — Encounter (HOSPITAL_COMMUNITY)
Admission: RE | Disposition: A | Discharge status: Home / Self Care | Payer: Self-pay | Source: Ambulatory Visit | Attending: Vascular Surgery

## 2017-06-25 DIAGNOSIS — T82858A Stenosis of vascular prosthetic devices, implants and grafts, initial encounter: Secondary | ICD-10-CM | POA: Insufficient documentation

## 2017-06-25 DIAGNOSIS — T82898A Other specified complication of vascular prosthetic devices, implants and grafts, initial encounter: Secondary | ICD-10-CM | POA: Diagnosis not present

## 2017-06-25 DIAGNOSIS — N186 End stage renal disease: Secondary | ICD-10-CM

## 2017-06-25 DIAGNOSIS — Z48812 Encounter for surgical aftercare following surgery on the circulatory system: Secondary | ICD-10-CM

## 2017-06-25 DIAGNOSIS — Z992 Dependence on renal dialysis: Secondary | ICD-10-CM | POA: Diagnosis not present

## 2017-06-25 DIAGNOSIS — Y832 Surgical operation with anastomosis, bypass or graft as the cause of abnormal reaction of the patient, or of later complication, without mention of misadventure at the time of the procedure: Secondary | ICD-10-CM | POA: Insufficient documentation

## 2017-06-25 DIAGNOSIS — Z7982 Long term (current) use of aspirin: Secondary | ICD-10-CM | POA: Insufficient documentation

## 2017-06-25 HISTORY — PX: PERIPHERAL VASCULAR BALLOON ANGIOPLASTY: CATH118281

## 2017-06-25 HISTORY — PX: A/V FISTULAGRAM: CATH118298

## 2017-06-25 LAB — POCT I-STAT, CHEM 8
BUN: 67 mg/dL — ABNORMAL HIGH (ref 6–20)
CREATININE: 9.4 mg/dL — AB (ref 0.61–1.24)
Calcium, Ion: 0.95 mmol/L — ABNORMAL LOW (ref 1.15–1.40)
Chloride: 99 mmol/L — ABNORMAL LOW (ref 101–111)
GLUCOSE: 101 mg/dL — AB (ref 65–99)
HCT: 32 % — ABNORMAL LOW (ref 39.0–52.0)
Hemoglobin: 10.9 g/dL — ABNORMAL LOW (ref 13.0–17.0)
POTASSIUM: 4.7 mmol/L (ref 3.5–5.1)
Sodium: 138 mmol/L (ref 135–145)
TCO2: 29 mmol/L (ref 0–100)

## 2017-06-25 SURGERY — A/V FISTULAGRAM
Anesthesia: LOCAL | Laterality: Right

## 2017-06-25 MED ORDER — HYDRALAZINE HCL 20 MG/ML IJ SOLN: 5.0000 mg | INTRAMUSCULAR | Status: DC | PRN

## 2017-06-25 MED ORDER — OXYCODONE HCL 5 MG PO TABS: 5.0000 mg | ORAL_TABLET | ORAL | Status: DC | PRN

## 2017-06-25 MED ORDER — ACETAMINOPHEN 325 MG PO TABS: 325.0000 mg | ORAL_TABLET | ORAL | Status: DC | PRN

## 2017-06-25 MED ORDER — HYDRALAZINE HCL 20 MG/ML IJ SOLN
INTRAMUSCULAR | Status: DC | PRN
  Administered 2017-06-25: 10 mg via INTRAVENOUS

## 2017-06-25 MED ORDER — ACETAMINOPHEN 325 MG RE SUPP: 325.0000 mg | RECTAL | Status: DC | PRN

## 2017-06-25 MED ORDER — IODIXANOL 320 MG/ML IV SOLN
INTRAVENOUS | Status: DC | PRN
  Administered 2017-06-25: 35 mL via INTRAVENOUS

## 2017-06-25 MED ORDER — LIDOCAINE HCL (PF) 1 % IJ SOLN
INTRAMUSCULAR | Status: AC
  Filled 2017-06-25: qty 30

## 2017-06-25 MED ORDER — ONDANSETRON HCL 4 MG/2ML IJ SOLN: 4.0000 mg | Freq: Four times a day (QID) | INTRAMUSCULAR | Status: DC | PRN

## 2017-06-25 MED ORDER — MORPHINE SULFATE (PF) 10 MG/ML IV SOLN: 2.0000 mg | INTRAVENOUS | Status: DC | PRN

## 2017-06-25 MED ORDER — SODIUM CHLORIDE 0.9% FLUSH: 3.0000 mL | INTRAVENOUS | Status: DC | PRN

## 2017-06-25 MED ORDER — LABETALOL HCL 5 MG/ML IV SOLN: 10.0000 mg | INTRAVENOUS | Status: DC | PRN

## 2017-06-25 MED ORDER — FENTANYL CITRATE (PF) 100 MCG/2ML IJ SOLN
INTRAMUSCULAR | Status: AC
  Filled 2017-06-25: qty 2

## 2017-06-25 MED ORDER — HYDRALAZINE HCL 20 MG/ML IJ SOLN
INTRAMUSCULAR | Status: AC
  Filled 2017-06-25: qty 1

## 2017-06-25 MED ORDER — LIDOCAINE HCL (PF) 1 % IJ SOLN
INTRAMUSCULAR | Status: DC | PRN
  Administered 2017-06-25: 5 mL via INTRADERMAL

## 2017-06-25 MED ORDER — FENTANYL CITRATE (PF) 100 MCG/2ML IJ SOLN
INTRAMUSCULAR | Status: DC | PRN
  Administered 2017-06-25: 25 ug via INTRAVENOUS

## 2017-06-25 MED ORDER — METOPROLOL TARTRATE 5 MG/5ML IV SOLN: 2.0000 mg | INTRAVENOUS | Status: DC | PRN

## 2017-06-25 SURGICAL SUPPLY — 19 items
BAG SNAP BAND KOVER 36X36 (MISCELLANEOUS) ×2 IMPLANT
BALLN DORADO 6X40X80 (BALLOONS) ×2
BALLN LUTONIX AV 7X40X75 (BALLOONS) ×2
BALLN MUSTANG 5.0X40 75 (BALLOONS) ×2
BALLOON DORADO 6X40X80 (BALLOONS) ×1 IMPLANT
BALLOON LUTONIX AV 7X40X75 (BALLOONS) ×1 IMPLANT
BALLOON MUSTANG 5.0X40 75 (BALLOONS) ×1 IMPLANT
COVER DOME SNAP 22 D (MISCELLANEOUS) ×2 IMPLANT
COVER PRB 48X5XTLSCP FOLD TPE (BAG) ×1 IMPLANT
COVER PROBE 5X48 (BAG) ×1
KIT ENCORE 26 ADVANTAGE (KITS) ×2 IMPLANT
KIT MICROINTRODUCER STIFF 5F (SHEATH) ×2 IMPLANT
PROTECTION STATION PRESSURIZED (MISCELLANEOUS) ×2
SHEATH PINNACLE R/O II 6F 4CM (SHEATH) ×2 IMPLANT
STATION PROTECTION PRESSURIZED (MISCELLANEOUS) ×1 IMPLANT
STOPCOCK MORSE 400PSI 3WAY (MISCELLANEOUS) ×2 IMPLANT
TRAY PV CATH (CUSTOM PROCEDURE TRAY) ×2 IMPLANT
TUBING CIL FLEX 10 FLL-RA (TUBING) ×2 IMPLANT
WIRE HITORQ VERSACORE ST 145CM (WIRE) ×2 IMPLANT

## 2017-06-25 NOTE — Interval H&P Note (Signed)
History and Physical Interval Note:  06/25/2017 10:44 AM  [name]  has presented today for surgery, with the diagnosis of ESRD  The various methods of treatment have been discussed with the patient and family. After consideration of risks, benefits and other options for treatment, the patient has consented to  Procedure(s): A/V Fistulagram (Right) as a surgical intervention .  The patient's history has been reviewed, patient examined, no change in status, stable for surgery.  I have reviewed the patient's chart and labs.  Questions were answered to the patient's satisfaction.     Ruta Hinds

## 2017-06-25 NOTE — Op Note (Addendum)
Procedure: Right arm fistulogram with angioplasty  Preoperative diagnosis: Non-maturing AV fistula right arm  Postoperative diagnosis: Same  Anesthesia: Local  Operative findings: #1 tubular narrowing mid fistula right upper arm 80% narrowing angioplasty to 0 residual stenosis.  Operative details: After obtaining informed consent, the patient today in the [place] lab. The patient placed in supine position Angio table. Patient's right upper extremity is prepped and draped in usual sterile fashion. Local anesthesia was insured over the proximal aspect of the fistula. Ultrasound was used to identify the fistula and this was punctured with a micropuncture needle under direct ultrasound guidance. Next my partner wire was threaded into the fistula. Legrand Como French sheath was placed over this. Contrast angiogram was then obtained through the micropuncture sheath. The right subclavian vein innominate vein and spare vena cava are widely patent. The cephalic vein is patent all the way through the upper arm. It is about 5-6 mm in diameter in the upper portion. There is a tubular stenosis mid upper arm fistula. This approaches 80%. Next the micropuncture sheath was exchanged over an 035 versacore wire for a 6 French short sheath. A 5 mm x 4 mm Mustang Angio balloon was then advanced and centered on the lesion and 2 separate overlapping inflations were performed. There were still some residual 30% stenosis. A 6 mm x 4 centimeter Atlas balloon was then advanced and centered on the lesion and inflated to 8 atm for a minute. This was then removed and a 7 mm x 4 cm Lutonix balloon was then inflated after centering on the lesion to 8 atm for 2 minutes. Final contrast angiogram showed no evidence of dissection or contrast extravasation. There was 0 residual stenosis. The patient tolerated procedure well and was taken to the holding area in stable condition. A Monocryl stitch was placed at the sheath exit site and hemostasis  obtained with direct pressure.  Ruta Hinds, MD [facility] [place] Office: [phone] Pager: [phone]

## 2017-06-25 NOTE — H&P (View-Only) (Signed)
POST OPERATIVE OFFICE NOTE    CC:  F/u for surgery  HPI:  This is a 61 y.o. male who is s/p right brachial cephalic AV fistula on 02/21/39 by Dr. Oneida Alar.  He returns today for follow up to check on his fistula maturation.    He states that he has some mild numbness in his right 4th and 5th fingers, but otherwise, no pain in his hand.  He is on dialysis and dialyzes M/W/F at the [facility].  He is unsure of the doctor's name.  Allergies  Allergen Reactions  . Pork-Derived Products Other (See Comments)    NOT AN ALLERGY > RELIGIOUS REASONS  . Ppd [Tuberculin Purified Protein Derivative] Rash    Current Outpatient Prescriptions  Medication Sig Dispense Refill  . amLODipine (NORVASC) 10 MG tablet Take 1 tablet (10 mg total) by mouth daily. 30 tablet 0  . Ascorbic Acid (VITAMIN C PO) Take 1 tablet by mouth daily.    . B Complex Vitamins (B-COMPLEX/B-12) LIQD Place 2 sprays under the tongue daily.    . calcium acetate (PHOSLO) 667 MG capsule Take 2,001 mg by mouth 3 (three) times daily with meals.   6  . aspirin 81 MG chewable tablet Chew 1 tablet (81 mg total) by mouth daily. (Patient not taking: Reported on 03/23/2017) 30 tablet 5  . carvedilol (COREG) 12.5 MG tablet Take 1 tablet (12.5 mg total) by mouth 2 (two) times daily with a meal. (Patient not taking: Reported on 06/17/2017) 60 tablet 0  . cyclobenzaprine (FLEXERIL) 10 MG tablet Take 1 tablet (10 mg total) by mouth 2 (two) times daily as needed for muscle spasms. (Patient not taking: Reported on 03/23/2017) 20 tablet 0  . dorzolamide-timolol (COSOPT) 22.3-6.8 MG/ML ophthalmic solution Place 1 drop into the left eye 2 times daily.    . furosemide (LASIX) 80 MG tablet Take 1 tablet (80 mg total) by mouth daily. (Patient not taking: Reported on 03/23/2017)    . hydrALAZINE (APRESOLINE) 100 MG tablet Take 0.5 tablets (50 mg total) by mouth 2 (two) times daily. (Patient not taking: Reported on 03/23/2017)    . HYDROcodone-acetaminophen  (NORCO/VICODIN) 5-325 MG tablet Take 1-2 tablets by mouth every 6 (six) hours as needed. (Patient not taking: Reported on 03/23/2017) 12 tablet 0  . losartan-hydrochlorothiazide (HYZAAR) 100-25 MG tablet TAKE 1 TAB BY MOUTH ONCE A DAY.  2  . oxyCODONE-acetaminophen (PERCOCET/ROXICET) 5-325 MG tablet Take 1-2 tablets by mouth every 6 (six) hours as needed. (Patient not taking: Reported on 06/17/2017) 6 tablet 0   No current facility-administered medications for this visit.      ROS:  See HPI  Physical Exam:  Vitals:   06/17/17 1341 06/17/17 1346  BP: (!) 151/79 (!) 166/89  Pulse: 82 82  Resp: 18   Temp: (!) 97 F (36.1 C)     Incision:  Well healed Extremities:  +palpable thrill in fistula as well as bruit  Dialysis fistula duplex 06/17/17: Diameter:  0.38cm-0.53cm Depth:  0.39cm-0.73cm  Narrowing of the mid outflow vein with increase in velocity.  No retained valve or thrombus noted.   Assessment/Plan:  This is a 61 y.o. male who is s/p: right brachial cephalic AV fistula 12/01/70 by Dr. Oneida Alar   -pt's duplex today reveals that he may have a narrowing in the mid cephalic vein.  Dr. Oneida Alar has recommended fistulogram of the right upper arm fistula to evaluate fistula as it is marginal at this point.  He has had failed left  BVT and left arm graft x 2 revisions in the past.  If unable to get fistula to mature, may require a a graft in the right arm.  This was discussed with pt. -he dialyzes M/W/F.  Will schedule for 06/25/17 and have his dialysis moved to Saturday 06/26/17 if possible.  If this does not work out, may need to be scheduled on a Tuesday or Thursday by one of Dr. Oneida Alar partners.   -pt is on a daily aspirin -he is scheduled to have eye surgery on June 24, 2017.   Leontine Locket, PA-C [facility] [phone]  Clinic MD:  Pt seen and examined with Dr. Oneida Alar  History and exam details as above. Patient's fistula has not matured sufficiently for  cannulation at this point. We will schedule him for a fistulogram in the near future. Possible intervention with balloon angioplasty of possible. If the fistula does not really have any mechanical reason for non-maturation we may need to consider him for an AV graft. Risks benefits possible palpitations and procedure details the fistula gram were explained the patient today including but not limited to bleeding infection possible injury to the fistula he understands and agrees to proceed.  Ruta Hinds, MD [facility] [place] Office: [phone] Pager: [phone]

## 2017-06-25 NOTE — Discharge Instructions (Signed)

## 2017-06-28 ENCOUNTER — Telehealth: Payer: Self-pay | Admitting: Vascular Surgery

## 2017-06-28 NOTE — Telephone Encounter (Signed)
-----   Message from Denman George, RN sent at 06/25/2017 12:49 PM EDT ----- Regarding: needs 2 week f/u w/ Dr. Oneida Alar and Access duplex right arm   ----- Message ----- From: Elam Dutch, MD Sent: 06/25/2017  12:19 PM To: V[facility] Charge Pool  [place] right arm PTA right arm AV fistula  Please schedule appt for 2 weeks with [place]  Allen Miller

## 2017-06-28 NOTE — Telephone Encounter (Signed)
Spoke to pt to sch lab 07/13/17 at 11:00 and MD 07/15/17 at 3:30.

## 2017-07-06 ENCOUNTER — Encounter: Payer: Self-pay | Admitting: Vascular Surgery

## 2017-07-13 ENCOUNTER — Ambulatory Visit (HOSPITAL_COMMUNITY)
Admission: RE | Admit: 2017-07-13 | Discharge: 2017-07-13 | Disposition: A | Discharge status: Home / Self Care | Payer: PRIVATE HEALTH INSURANCE | Source: Ambulatory Visit | Attending: Vascular Surgery | Admitting: Vascular Surgery

## 2017-07-13 DIAGNOSIS — N186 End stage renal disease: Secondary | ICD-10-CM

## 2017-07-13 DIAGNOSIS — Z992 Dependence on renal dialysis: Secondary | ICD-10-CM | POA: Diagnosis not present

## 2017-07-13 DIAGNOSIS — Z48812 Encounter for surgical aftercare following surgery on the circulatory system: Secondary | ICD-10-CM | POA: Diagnosis not present

## 2017-07-15 ENCOUNTER — Encounter (HOSPITAL_COMMUNITY): Payer: Non-veteran care

## 2017-07-15 ENCOUNTER — Ambulatory Visit (INDEPENDENT_AMBULATORY_CARE_PROVIDER_SITE_OTHER): Payer: PRIVATE HEALTH INSURANCE | Admitting: Vascular Surgery

## 2017-07-15 ENCOUNTER — Encounter: Payer: Self-pay | Admitting: Vascular Surgery

## 2017-07-15 ENCOUNTER — Other Ambulatory Visit: Payer: Self-pay | Admitting: *Deleted

## 2017-07-15 ENCOUNTER — Encounter: Payer: Self-pay | Admitting: *Deleted

## 2017-07-15 VITALS — BP 171/90 | HR 85 | Temp 99.8°F | Resp 20 | Ht 64.0 in | Wt 185.0 lb

## 2017-07-15 DIAGNOSIS — Z992 Dependence on renal dialysis: Secondary | ICD-10-CM

## 2017-07-15 DIAGNOSIS — N186 End stage renal disease: Secondary | ICD-10-CM

## 2017-07-15 NOTE — Progress Notes (Signed)
Patient is a 61 year old male who returns for follow-up today. He previously had a failed left basilic vein transposition fistula in 2017. He then had a left upper arm graft placed in November 2017 which failed in April 2018. He then had a right brachiocephalic AV fistula placed in April 2018. This has failed to mature. He had a fistulogram and angioplasty of the distal third of the fistula performed on 06/25/2017. The fistula still has not completely matured and the patient returns for follow-up today. He is currently dialyzing via a catheter on Monday Wednesday Friday. He denies any numbness or tingling in his hand.  Review of systems: He denies shortness of breath. He denies chest pain.  Current Outpatient Prescriptions on File Prior to Visit  Medication Sig Dispense Refill  . acetaminophen (TYLENOL) 500 MG tablet Take 1,000 mg by mouth every 6 (six) hours as needed for moderate pain or headache.    . sevelamer carbonate (RENVELA) 800 MG tablet Take 2,400 mg by mouth 3 (three) times daily with meals.    . dorzolamide-timolol (COSOPT) 22.3-6.8 MG/ML ophthalmic solution Instill 1 drop in each eye once daily     No current facility-administered medications on file prior to visit.      Past Medical History:  Diagnosis Date  . Cancer [facility])    kidney cancer  . ESRD (end stage renal disease) ([place])    MWF  . Hemodialysis patient ([place])   . Hypertension   . Low iron   . Shortness of breath dyspnea    when lying flat  . Stroke [facility])    effective vision- Left eye    Physical exam:  Vitals:   07/15/17 1536 07/15/17 1538  BP: (!) 176/93 (!) 171/90  Pulse: 85   Resp: 20   Temp: 99.8 F (37.7 C)   TempSrc: Oral   SpO2: 97%   Weight: 185 lb (83.9 kg)   Height: 5\' 4"  (1.626 m)     Extremities: Pulsatile right brachiocephalic AV fistula  Data: I reviewed the patient's previous vein mapping ultrasound from April 2018. This showed a very small right basilic vein  The patient had a duplex  ultrasound of his AV fistula on August 14. This showed that the fistula was patent and about 5 mm in diameter. There were 2 large side branches.  Assessment: Difficult access patient multiple failures in the left arm now with poorly maturing AV fistula in the right arm no basilic vein for creation of a basilic vein transposition. Before converting this to an AV graft of believe the patient probably needs one more attempt at salvaging the right brachiocephalic AV fistula. We will schedule him for side branch ligation and patch angioplasty of the narrowed portion of the fistula early next week. Risks benefits possible complications and procedure details were discussed with the patient today including the fact that this access may not work for him and he may be requiring a graft in the near future. He understands and agrees to proceed.  Plan: See above  Ruta Hinds, MD [facility] [place] Office: [phone] Pager: [phone]

## 2017-07-16 ENCOUNTER — Telehealth: Payer: Self-pay | Admitting: *Deleted

## 2017-07-16 NOTE — Telephone Encounter (Signed)
[facility], called and said that this patient wants to have his surgery done elsewhere. She appreciated [place] seeing him yesterday with Dr. Oneida Alar. I have cancelled his Right arm AVF revision that was scheduled on 07-20-17 with Dr. Oneida Alar.

## 2017-07-20 ENCOUNTER — Encounter (HOSPITAL_COMMUNITY): Admission: RE | Payer: Self-pay | Source: Ambulatory Visit

## 2017-07-20 ENCOUNTER — Ambulatory Visit (HOSPITAL_COMMUNITY): Admission: RE | Admit: 2017-07-20 | Payer: Non-veteran care | Source: Ambulatory Visit | Admitting: Vascular Surgery

## 2017-07-20 SURGERY — REVISON OF ARTERIOVENOUS FISTULA
Anesthesia: Choice | Laterality: Right

## 2017-11-30 DIAGNOSIS — K920 Hematemesis: Secondary | ICD-10-CM

## 2017-11-30 HISTORY — DX: Hematemesis: K92.0

## 2017-12-21 ENCOUNTER — Encounter (HOSPITAL_COMMUNITY): Payer: Self-pay

## 2017-12-21 ENCOUNTER — Other Ambulatory Visit: Payer: Self-pay

## 2017-12-21 ENCOUNTER — Emergency Department (HOSPITAL_COMMUNITY): Payer: BLUE CROSS/BLUE SHIELD

## 2017-12-21 ENCOUNTER — Inpatient Hospital Stay (HOSPITAL_COMMUNITY)
Admission: EM | Admit: 2017-12-21 | Discharge: 2017-12-26 | DRG: 391 | Disposition: A | Discharge status: Home / Self Care | Payer: BLUE CROSS/BLUE SHIELD | Attending: Family Medicine | Admitting: Family Medicine

## 2017-12-21 DIAGNOSIS — D72829 Elevated white blood cell count, unspecified: Secondary | ICD-10-CM | POA: Diagnosis present

## 2017-12-21 DIAGNOSIS — D509 Iron deficiency anemia, unspecified: Secondary | ICD-10-CM | POA: Diagnosis not present

## 2017-12-21 DIAGNOSIS — Z888 Allergy status to other drugs, medicaments and biological substances status: Secondary | ICD-10-CM | POA: Diagnosis not present

## 2017-12-21 DIAGNOSIS — Z8673 Personal history of transient ischemic attack (TIA), and cerebral infarction without residual deficits: Secondary | ICD-10-CM

## 2017-12-21 DIAGNOSIS — R0689 Other abnormalities of breathing: Secondary | ICD-10-CM | POA: Diagnosis not present

## 2017-12-21 DIAGNOSIS — R739 Hyperglycemia, unspecified: Secondary | ICD-10-CM | POA: Diagnosis present

## 2017-12-21 DIAGNOSIS — Z7982 Long term (current) use of aspirin: Secondary | ICD-10-CM

## 2017-12-21 DIAGNOSIS — E860 Dehydration: Secondary | ICD-10-CM | POA: Diagnosis present

## 2017-12-21 DIAGNOSIS — Z6829 Body mass index (BMI) 29.0-29.9, adult: Secondary | ICD-10-CM

## 2017-12-21 DIAGNOSIS — E1122 Type 2 diabetes mellitus with diabetic chronic kidney disease: Secondary | ICD-10-CM | POA: Diagnosis present

## 2017-12-21 DIAGNOSIS — K2210 Ulcer of esophagus without bleeding: Secondary | ICD-10-CM | POA: Diagnosis not present

## 2017-12-21 DIAGNOSIS — R112 Nausea with vomiting, unspecified: Secondary | ICD-10-CM | POA: Diagnosis not present

## 2017-12-21 DIAGNOSIS — K2100 Gastro-esophageal reflux disease with esophagitis, without bleeding: Secondary | ICD-10-CM

## 2017-12-21 DIAGNOSIS — Z85528 Personal history of other malignant neoplasm of kidney: Secondary | ICD-10-CM

## 2017-12-21 DIAGNOSIS — H538 Other visual disturbances: Secondary | ICD-10-CM | POA: Diagnosis present

## 2017-12-21 DIAGNOSIS — E669 Obesity, unspecified: Secondary | ICD-10-CM | POA: Diagnosis present

## 2017-12-21 DIAGNOSIS — R079 Chest pain, unspecified: Secondary | ICD-10-CM

## 2017-12-21 DIAGNOSIS — N186 End stage renal disease: Secondary | ICD-10-CM | POA: Diagnosis not present

## 2017-12-21 DIAGNOSIS — R1310 Dysphagia, unspecified: Secondary | ICD-10-CM | POA: Diagnosis not present

## 2017-12-21 DIAGNOSIS — D631 Anemia in chronic kidney disease: Secondary | ICD-10-CM | POA: Diagnosis present

## 2017-12-21 DIAGNOSIS — Z91018 Allergy to other foods: Secondary | ICD-10-CM

## 2017-12-21 DIAGNOSIS — R748 Abnormal levels of other serum enzymes: Secondary | ICD-10-CM | POA: Diagnosis present

## 2017-12-21 DIAGNOSIS — I120 Hypertensive chronic kidney disease with stage 5 chronic kidney disease or end stage renal disease: Secondary | ICD-10-CM | POA: Diagnosis present

## 2017-12-21 DIAGNOSIS — K920 Hematemesis: Secondary | ICD-10-CM

## 2017-12-21 DIAGNOSIS — Z79899 Other long term (current) drug therapy: Secondary | ICD-10-CM | POA: Diagnosis not present

## 2017-12-21 DIAGNOSIS — Z992 Dependence on renal dialysis: Secondary | ICD-10-CM | POA: Diagnosis not present

## 2017-12-21 DIAGNOSIS — K5900 Constipation, unspecified: Secondary | ICD-10-CM

## 2017-12-21 HISTORY — DX: Hematemesis: K92.0

## 2017-12-21 LAB — COMPREHENSIVE METABOLIC PANEL
ALBUMIN: 4 g/dL (ref 3.5–5.0)
ALK PHOS: 153 U/L — AB (ref 38–126)
ALT: 17 U/L (ref 17–63)
ANION GAP: 23 — AB (ref 5–15)
AST: 17 U/L (ref 15–41)
BILIRUBIN TOTAL: 0.7 mg/dL (ref 0.3–1.2)
BUN: 69 mg/dL — AB (ref 6–20)
CALCIUM: 8.6 mg/dL — AB (ref 8.9–10.3)
CO2: 22 mmol/L (ref 22–32)
Chloride: 91 mmol/L — ABNORMAL LOW (ref 101–111)
Creatinine, Ser: 15.71 mg/dL — ABNORMAL HIGH (ref 0.61–1.24)
GFR calc Af Amer: 3 mL/min — ABNORMAL LOW (ref >60)
GFR calc non Af Amer: 3 mL/min — ABNORMAL LOW (ref >60)
GLUCOSE: 260 mg/dL — AB (ref 65–99)
Potassium: 3.7 mmol/L (ref 3.5–5.1)
SODIUM: 136 mmol/L (ref 135–145)
Total Protein: 8.9 g/dL — ABNORMAL HIGH (ref 6.5–8.1)

## 2017-12-21 LAB — CBC
HCT: 33.5 % — ABNORMAL LOW (ref 39.0–52.0)
HCT: 30.5 % — ABNORMAL LOW (ref 39.0–52.0)
HEMOGLOBIN: 9.7 g/dL — AB (ref 13.0–17.0)
Hemoglobin: 10.8 g/dL — ABNORMAL LOW (ref 13.0–17.0)
MCH: 26.7 pg (ref 26.0–34.0)
MCH: 26.6 pg (ref 26.0–34.0)
MCHC: 32.2 g/dL (ref 30.0–36.0)
MCHC: 31.8 g/dL (ref 30.0–36.0)
MCV: 82.9 fL (ref 78.0–100.0)
MCV: 83.6 fL (ref 78.0–100.0)
Platelets: 332 10*3/uL (ref 150–400)
Platelets: 276 10*3/uL (ref 150–400)
RBC: 4.04 MIL/uL — ABNORMAL LOW (ref 4.22–5.81)
RBC: 3.65 MIL/uL — AB (ref 4.22–5.81)
RDW: 14.8 % (ref 11.5–15.5)
RDW: 14.6 % (ref 11.5–15.5)
WBC: 11 10*3/uL — ABNORMAL HIGH (ref 4.0–10.5)
WBC: 11.8 10*3/uL — AB (ref 4.0–10.5)

## 2017-12-21 LAB — TYPE AND SCREEN
ABO/RH(D): A POS
ANTIBODY SCREEN: NEGATIVE

## 2017-12-21 LAB — I-STAT TROPONIN, ED: TROPONIN I, POC: 0.03 ng/mL (ref 0.00–0.08)

## 2017-12-21 LAB — ABO/RH: ABO/RH(D): A POS

## 2017-12-21 LAB — POC OCCULT BLOOD, ED: Fecal Occult Bld: NEGATIVE

## 2017-12-21 LAB — APTT: aPTT: 30 seconds (ref 24–36)

## 2017-12-21 LAB — PROTIME-INR
INR: 1.13
PROTHROMBIN TIME: 14.4 s (ref 11.4–15.2)

## 2017-12-21 LAB — TROPONIN I: Troponin I: <0.03 ng/mL (ref <0.03)

## 2017-12-21 LAB — LIPASE, BLOOD: Lipase: 36 U/L (ref 11–51)

## 2017-12-21 MED ORDER — HYDRALAZINE HCL 20 MG/ML IJ SOLN: 5.0000 mg | INTRAMUSCULAR | Status: DC | PRN

## 2017-12-21 MED ORDER — SODIUM CHLORIDE 0.9 % IV BOLUS (SEPSIS)
1000.0000 mL | Freq: Once | INTRAVENOUS | Status: AC
  Administered 2017-12-21: 1000 mL via INTRAVENOUS

## 2017-12-21 MED ORDER — PANTOPRAZOLE SODIUM 40 MG IV SOLR: 40.0000 mg | Freq: Two times a day (BID) | INTRAVENOUS | Status: DC

## 2017-12-21 MED ORDER — PANTOPRAZOLE SODIUM 40 MG IV SOLR
8.0000 mg/h | INTRAVENOUS | Status: DC
  Administered 2017-12-21 – 2017-12-23 (×3): 8 mg/h via INTRAVENOUS
  Filled 2017-12-21 (×5): qty 80

## 2017-12-21 MED ORDER — ZOLPIDEM TARTRATE 5 MG PO TABS: 5.0000 mg | ORAL_TABLET | Freq: Every evening | ORAL | Status: DC | PRN

## 2017-12-21 MED ORDER — GENTAMICIN SULFATE 0.1 % EX CREA
1.0000 "application " | TOPICAL_CREAM | Freq: Every day | CUTANEOUS | Status: DC
  Administered 2017-12-22 – 2017-12-25 (×3): 1 via TOPICAL
  Filled 2017-12-21: qty 15

## 2017-12-21 MED ORDER — HEPARIN 1000 UNIT/ML FOR PERITONEAL DIALYSIS
INTRAPERITONEAL | Status: DC | PRN
  Filled 2017-12-21: qty 5000

## 2017-12-21 MED ORDER — AMLODIPINE BESYLATE 10 MG PO TABS
10.0000 mg | ORAL_TABLET | Freq: Two times a day (BID) | ORAL | Status: DC
  Administered 2017-12-21 – 2017-12-24 (×5): 10 mg via ORAL
  Filled 2017-12-21 (×5): qty 1
  Filled 2017-12-21: qty 2

## 2017-12-21 MED ORDER — VITAMIN D 1000 UNITS PO TABS
2000.0000 [IU] | ORAL_TABLET | Freq: Every day | ORAL | Status: DC
  Administered 2017-12-22: 2000 [IU] via ORAL
  Filled 2017-12-21 (×2): qty 2

## 2017-12-21 MED ORDER — MORPHINE SULFATE (PF) 4 MG/ML IV SOLN: 2.0000 mg | INTRAVENOUS | Status: DC | PRN

## 2017-12-21 MED ORDER — CALCIUM ACETATE (PHOS BINDER) 667 MG PO CAPS
667.0000 mg | ORAL_CAPSULE | Freq: Three times a day (TID) | ORAL | Status: DC
  Administered 2017-12-22 – 2017-12-25 (×4): 667 mg via ORAL
  Filled 2017-12-21 (×6): qty 1

## 2017-12-21 MED ORDER — ASPIRIN 81 MG PO CHEW: 81.0000 mg | CHEWABLE_TABLET | Freq: Every day | ORAL | Status: DC

## 2017-12-21 MED ORDER — ACETAMINOPHEN 325 MG PO TABS
650.0000 mg | ORAL_TABLET | Freq: Four times a day (QID) | ORAL | Status: DC | PRN
  Administered 2017-12-24: 650 mg via ORAL
  Filled 2017-12-21: qty 2

## 2017-12-21 MED ORDER — METOCLOPRAMIDE HCL 5 MG/ML IJ SOLN
10.0000 mg | Freq: Once | INTRAMUSCULAR | Status: AC
  Administered 2017-12-21: 10 mg via INTRAVENOUS
  Filled 2017-12-21: qty 2

## 2017-12-21 MED ORDER — HEPARIN 1000 UNIT/ML FOR PERITONEAL DIALYSIS: 500.0000 [IU] | INTRAMUSCULAR | Status: DC | PRN

## 2017-12-21 MED ORDER — PANTOPRAZOLE SODIUM 40 MG IV SOLR
80.0000 mg | Freq: Once | INTRAVENOUS | Status: AC
  Administered 2017-12-21: 80 mg via INTRAVENOUS
  Filled 2017-12-21 (×2): qty 80

## 2017-12-21 MED ORDER — ONDANSETRON HCL 4 MG/2ML IJ SOLN
4.0000 mg | Freq: Three times a day (TID) | INTRAMUSCULAR | Status: DC | PRN
  Administered 2017-12-22 – 2017-12-25 (×5): 4 mg via INTRAVENOUS
  Filled 2017-12-21 (×6): qty 2

## 2017-12-21 NOTE — ED Notes (Signed)
Dialysis nurse at bedside removing fluids for lab.

## 2017-12-21 NOTE — ED Notes (Signed)
Brother called from [place]; patient resting, will call back

## 2017-12-21 NOTE — ED Notes (Signed)
Pt in X-ray. Called x-ray to transport to D31. Family in room.

## 2017-12-21 NOTE — H&P (Signed)
History and Physical    Allen Miller YQM:[id number] DOB: [birth year] DOA: 12/21/2017  Referring MD/NP/PA:   PCP: [facility]   Patient coming from:  The patient is coming from home.  At baseline, pt is independent for most of ADL.   Chief Complaint: Coffee-ground emesis  HPI: Allen Miller is a 62 y.o. male with medical history significant of hypertension, stroke, kidney cancer, ESRD on peritoneal dialysis, who presents with coffee ground emesis.  Patient states that he has been having nausea and vomiting in the past 2 days. He has vomited more than 10 times of coffee ground materials. No fever, but has chills. Denies abdominal pain or diarrhea. Pt states that he never had EGD or colonoscopy before. Patient is taking 81 mg aspirin daily for hx of stroke. Patient states that he does not have chest pain at rest, but severe nausea and vomiting cause some mild chest pain, which is located in the central chest. Denies cough or SOB. Patient denies symptoms of UTI.  ED Course: pt was found to have WBC 11.0, negative troponin, negative FOBT, hgb stable (10.9 on 06/25/17-->10.89 today), potassium 3.7, bicarbonate 22, creatinine 15.71, BUN 69, temperature normal, elevated blood pressure 180/104, heart rate in 90s, oxygen saturation 99% on room air, negative chest x-ray. Patient is admitted to telemetry bed as inpatient. Nephrology, Dr. Florene Glen was consulted.  Review of Systems:   General: no fevers, has chills, no body weight gain, has fatigue HEENT: no blurry vision, hearing changes or sore throat Respiratory: no dyspnea, coughing, wheezing CV: has chest pain, no palpitations GI: no nausea, vomiting, no abdominal pain, diarrhea, constipation GU: no dysuria, burning on urination, increased urinary frequency, hematuria  Ext: no leg edema Neuro: no unilateral weakness, numbness, or tingling, no vision change or hearing loss Skin: no rash, no skin tear. MSK: No muscle spasm, no  deformity, no limitation of range of movement in spin Heme: No easy bruising.  Travel history: No recent long distant travel.  Allergy:  Allergies  Allergen Reactions  . Pork-Derived Products Other (See Comments)    NOT AN ALLERGY > RELIGIOUS REASONS  . Ppd [Tuberculin Purified Protein Derivative] Rash    Past Medical History:  Diagnosis Date  . Cancer [facility])    kidney cancer  . ESRD (end stage renal disease) ([place])    MWF  . Hemodialysis patient ([place])   . Hypertension   . Low iron   . Shortness of breath dyspnea    when lying flat  . Stroke [facility])    effective vision- Left eye    Past Surgical History:  Procedure Laterality Date  . A/V FISTULAGRAM Right 06/25/2017   Procedure: A/V Fistulagram;  Surgeon: Elam Dutch, MD;  Location: [place] [facility];  Service: Cardiovascular;  Laterality: Right;  . AV FISTULA PLACEMENT Left 10/27/2016   Procedure: INSERTION LEFT UPPER ARM  OF ARTERIOVENOUS (AV) GORE-TEX GRAFT ARM;  Surgeon: Conrad Hatch, MD;  Location: [place];  Service: Vascular;  Laterality: Left;  . AV FISTULA PLACEMENT Right 03/23/2017   Procedure: ARTERIOVENOUS (AV) FISTULA CREATION;  Surgeon: Elam Dutch, MD;  Location: [place];  Service: Vascular;  Laterality: Right;  . BASCILIC VEIN TRANSPOSITION Left 02/24/2016   Procedure: BASILIC VEIN TRANSPOSITION VERSUS ARTERIOVENOUS GRAFT INSERTION;  Surgeon: [name], MD;  Location: [place];  Service: Vascular;  Laterality: Left;  . BASCILIC VEIN TRANSPOSITION Left 07/14/2016   Procedure: LEFT BASILIC VEIN TRANSPOSITION;  Surgeon: Conrad Questa, MD;  [facility];  Service: Vascular;  Laterality: Left;  . BASCILIC VEIN TRANSPOSITION Left 09/08/2016   Procedure: SECOND STAGE BASILIC VEIN TRANSPOSITION LEFT UPPER ARM;  Surgeon: Conrad Poyen, MD;  Location: [place];  Service: Vascular;  Laterality: Left;  . INSERTION OF DIALYSIS CATHETER N/A 02/24/2016   Procedure: INSERTION OF DIALYSIS CATHETER;  Surgeon: [name], MD;   Location: [place];  Service: Vascular;  Laterality: N/A;  . INSERTION OF DIALYSIS CATHETER Right 10/27/2016   Procedure: EXCHANGE OF RIGHT INTERNAL JUGULAR DIALYSIS CATHETER;  Surgeon: Conrad [place], MD;  Location: [place];  Service: Vascular;  Laterality: Right;  . PERIPHERAL VASCULAR BALLOON ANGIOPLASTY Right 06/25/2017   Procedure: Peripheral Vascular Balloon Angioplasty;  Surgeon: Elam Dutch, MD;  Location: [place] [facility];  Service: Cardiovascular;  Laterality: Right;  . THROMBECTOMY AND REVISION OF ARTERIOVENTOUS (AV) GORETEX  GRAFT Left 03/02/2017   Procedure: THROMBECTOMY AND REVISION OF ARTERIOVENous left arm GORETEX  GRAFT;  Surgeon: Elam Dutch, MD;  Location: [place];  Service: Vascular;  Laterality: Left;  . THROMBECTOMY W/ EMBOLECTOMY Left 12/14/2016   Procedure: THROMBECTOMY OF LEFT  ARTERIO-VENOUS GORTEX GRAFT; REVISION OF LEFT  ARTERIO-VENOUS GORE-TEX GRAFT;  Surgeon: [name], MD;  Location: [place];  Service: Vascular;  Laterality: Left;    Social History:  reports that  has never smoked. he has never used smokeless tobacco. He reports that he does not drink alcohol or use drugs.  Family History: No family history on file.   Prior to Admission medications   Medication Sig Start Date End Date Taking? Authorizing Provider  acetaminophen (TYLENOL) 500 MG tablet Take 1,000 mg by mouth every 6 (six) hours as needed for moderate pain or headache.   Yes [provider]  amLODipine (NORVASC) 10 MG tablet Take 10 mg by mouth 2 (two) times daily. 12/18/17  Yes [provider]  aspirin 81 MG chewable tablet Chew 81 mg by mouth daily.    Yes [provider]  calcium acetate (PHOSLO) 667 MG capsule Take 667 mg by mouth 3 (three) times daily. 03/02/16  Yes [provider]  Cholecalciferol (VITAMIN D3) 1000 units CAPS Take 2,000 Units by mouth daily.   Yes [provider]    Physical Exam: Vitals:   12/21/17 1930 12/21/17 2000 12/21/17  2004 12/21/17 2005  BP: (!) 184/83 (!) 188/104    Pulse: 100 (!) 101    Resp: 15 18    Temp:      TempSrc:      SpO2: 99% 100%    Weight:   88 kg (194 lb) 88 kg (194 lb)  Height:   5\' 4"  (1.626 m)    General: Not in acute distress HEENT:       Eyes: PERRL, EOMI, no scleral icterus.       ENT: No discharge from the ears and nose, no pharynx injection, no tonsillar enlargement.        Neck: No JVD, no bruit, no mass felt. Heme: No neck lymph node enlargement. Cardiac: S1/S2, RRR, No murmurs, No gallops or rubs. Respiratory: No rales, wheezing, rhonchi or rubs. GI: Soft, nondistended, nontender, no rebound pain, no organomegaly, BS present. Has peritoneal dialysis catheter in place with clean surroundings. GU: No hematuria Ext: No pitting leg edema bilaterally. 2+DP/PT pulse bilaterally. Musculoskeletal: No joint deformities, No joint redness or warmth, no limitation of ROM in spin. Skin: No rashes.  Neuro: Alert, oriented X3, cranial nerves II-XII grossly intact,  moves all extremities normally.  Psych: Patient is not psychotic, no suicidal or hemocidal ideation.  Labs on Admission: I have personally reviewed following labs and imaging studies  CBC: Recent Labs  Lab 12/21/17 1744  WBC 11.0*  HGB 10.8*  HCT 33.5*  MCV 82.9  PLT 559   Basic Metabolic Panel: Recent Labs  Lab 12/21/17 1744  NA 136  K 3.7  CL 91*  CO2 22  GLUCOSE 260*  BUN 69*  CREATININE 15.71*  CALCIUM 8.6*   GFR: Estimated Creatinine Clearance: 5 mL/min (A) (by C-G formula based on SCr of 15.71 mg/dL (H)). Liver Function Tests: Recent Labs  Lab 12/21/17 1744  AST 17  ALT 17  ALKPHOS 153*  BILITOT 0.7  PROT 8.9*  ALBUMIN 4.0   Recent Labs  Lab 12/21/17 1744  LIPASE 36   No results for input(s): AMMONIA in the last 168 hours. Coagulation Profile: No results for input(s): INR, PROTIME in the last 168 hours. Cardiac Enzymes: No results for input(s): CKTOTAL, CKMB, CKMBINDEX, TROPONINI  in the last 168 hours. BNP (last 3 results) No results for input(s): PROBNP in the last 8760 hours. HbA1C: No results for input(s): HGBA1C in the last 72 hours. CBG: No results for input(s): GLUCAP in the last 168 hours. Lipid Profile: No results for input(s): CHOL, HDL, LDLCALC, TRIG, CHOLHDL, LDLDIRECT in the last 72 hours. Thyroid Function Tests: No results for input(s): TSH, T4TOTAL, FREET4, T3FREE, THYROIDAB in the last 72 hours. Anemia Panel: No results for input(s): VITAMINB12, FOLATE, FERRITIN, TIBC, IRON, RETICCTPCT in the last 72 hours. Urine analysis:    Component Value Date/Time   COLORURINE YELLOW 01/22/2016 0808   APPEARANCEUR CLEAR 01/22/2016 0808   LABSPEC 1.005 01/22/2016 0808   PHURINE 7.5 01/22/2016 0808   GLUCOSEU NEGATIVE 01/22/2016 0808   HGBUR SMALL (A) 01/22/2016 0808   BILIRUBINUR NEGATIVE 01/22/2016 0808   KETONESUR NEGATIVE 01/22/2016 0808   PROTEINUR >300 (A) 01/22/2016 0808   NITRITE NEGATIVE 01/22/2016 0808   LEUKOCYTESUR NEGATIVE 01/22/2016 0808   Sepsis Labs: @LABRCNTIP (procalcitonin:4,lacticidven:4) )No results found for this or any previous visit (from the past 240 hour(s)).   Radiological Exams on Admission: Dg Chest 2 View  Result Date: 12/21/2017 CLINICAL DATA:  Right-sided chest pain, nausea and vomiting with dizziness. End-stage renal disease. EXAM: CHEST  2 VIEW COMPARISON:  10/27/2016 FINDINGS: The heart size and mediastinal contours are within normal limits. Tip of right-sided dialysis catheter is seen in the mid right atrium. Both lungs are clear. The visualized skeletal structures are unremarkable. IMPRESSION: No active cardiopulmonary disease. Tip of right-sided dialysis catheter is seen in the mid right atrium. Electronically Signed   By: Ashley Royalty M.D.   On: 12/21/2017 18:35     EKG: Independently reviewed. Sinus rhythm, QTC 497, nonspecific T-wave change  Assessment/Plan Principal Problem:   Coffee ground emesis Active  Problems:   History of CVA (cerebrovascular accident)   Microcytic anemia   ESRD on dialysis (H[facility])   Chest pain   Coffee ground emesis: Hemoglobin stable. Patient states that he never had EGD or colonoscopy. FOBT is positive. Hemoglobin stable 10.8. Currently hemodynamically stable. Patient patient denies abdominal pain or fever. Patient is oriented 3. He has mild leukocytosis with WBC 11.0, will get peritoneal fluid for analysis to rule out SBP.  - will admit to tele bed as inpt - NPO - IVF: 1L NS bolus was given in ED - Start IV pantoprazole gtt - Zofran IV for nausea - Avoid NSAIDs and  SQ heparin - Maintain IV access (2 large bore IVs if possible). - Monitor closely and follow q6h cbc, transfuse as necessary, if Hgb<7.0 - LaB: INR, PTT and type screen -f/u Bx, UA, Ux - please call GI in AM  History of CVA (cerebrovascular accident): -hold ASA   Microcytic anemia: hgb stable -f/u cbc q6h  ESRD on dialysis [facility]):  potassium 3.7, bicarbonate 22, creatinine 15.71, BUN 69, -renal, Dr. Florene Glen was consulted by EDP  Chest pain: no chest pain at rest. Severe nausea and vomiting induced some chest pain per patient. -will check lipase -trop x 3 -check A1c and FLP -prn morphine for pain -hold ASA due to hematemesis   DVT ppx: SCD Code Status: Full code Family Communication: None at bed side.   Disposition Plan:  Anticipate discharge back to previous home environment Consults called:  Renal, Dr. Bjorn Pippin Admission status:  Inpatient/tele     Date of Service 12/21/2017    Ivor Costa [facility] Pager [phone]  If 7PM-7AM, please contact night-coverage [email or web address] Password [facility] 12/21/2017, 9:25 PM

## 2017-12-21 NOTE — Progress Notes (Signed)
Asked by Dr Winfred Leeds to assist with PD.  Orders written for overnight.  Full consult in AM. Estanislado Emms, MD

## 2017-12-21 NOTE — ED Provider Notes (Signed)
[facility] EMERGENCY DEPARTMENT Provider Note   CSN: [id number] Arrival date & time: 12/21/17  1701     History   Chief Complaint Chief Complaint  Patient presents with  . Hematemesis  . Chest Pain    HPI Allen Miller is a 62 y.o. male.  Reports that he had no appetite for the past 4 days and is vomited approximately 10 times in the past 2 days Blood streaks mixed with coffee ground material.  He complains of mild sore throat and chest pain only when he vomits.  Chest pain is substernal area lasting a few seconds while vomiting.  No other associated symptoms.  He reports that he stopped his peritoneal dialysis after partially treating himself yesterday.  He went to the [facility] today was administered Zofran but continues to vomit.  He denies abdominal pain denies fever.  Last bowel movement yesterday, slight amount, no blood per rectum  HPI  Past Medical History:  Diagnosis Date  . [facility])    kidney cancer  . ESRD (end stage renal disease) ([place])    MWF  . Hemodialysis patient ([place])   . Hypertension   . Low iron   . Shortness of breath dyspnea    when lying flat  . Stroke [facility])    effective vision- Left eye    Patient Active Problem List   Diagnosis Date Noted  . Hyperkalemia 08/25/2016  . Acute CVA (cerebrovascular accident) ([place]) 01/24/2016  . Hypertensive emergency 01/21/2016  . History of CVA (cerebrovascular accident) 01/21/2016  . Visual disturbance 01/21/2016  . Microcytic anemia 01/21/2016  . ESRD on dialysis ([place]) 01/21/2016  . Blurred vision, left eye     Past Surgical History:  Procedure Laterality Date  . A/V FISTULAGRAM Right 06/25/2017   Procedure: A/V Fistulagram;  Surgeon: Elam Dutch, MD;  Location: [place] CV LAB;  Service: Cardiovascular;  Laterality: Right;  . AV FISTULA PLACEMENT Left 10/27/2016   Procedure: INSERTION LEFT UPPER ARM  OF ARTERIOVENOUS (AV) GORE-TEX GRAFT ARM;  Surgeon: Conrad [place]ona, MD;   Location: [place];  Service: Vascular;  Laterality: Left;  . AV FISTULA PLACEMENT Right 03/23/2017   Procedure: ARTERIOVENOUS (AV) FISTULA CREATION;  Surgeon: Elam Dutch, MD;  Location: [place];  Service: Vascular;  Laterality: Right;  . BASCILIC VEIN TRANSPOSITION Left 02/24/2016   Procedure: BASILIC VEIN TRANSPOSITION VERSUS ARTERIOVENOUS GRAFT INSERTION;  Surgeon: [name], MD;  Location: [place];  Service: Vascular;  Laterality: Left;  . BASCILIC VEIN TRANSPOSITION Left 07/14/2016   Procedure: LEFT BASILIC VEIN TRANSPOSITION;  Surgeon: Conrad Lennox, MD;  Location: [place];  Service: Vascular;  Laterality: Left;  . BASCILIC VEIN TRANSPOSITION Left 09/08/2016   Procedure: SECOND STAGE BASILIC VEIN TRANSPOSITION LEFT UPPER ARM;  Surgeon: Conrad [place], MD;  Location: [place];  Service: Vascular;  Laterality: Left;  . INSERTION OF DIALYSIS CATHETER N/A 02/24/2016   Procedure: INSERTION OF DIALYSIS CATHETER;  Surgeon: [name], MD;  Location: [place];  Service: Vascular;  Laterality: N/A;  . INSERTION OF DIALYSIS CATHETER Right 10/27/2016   Procedure: EXCHANGE OF RIGHT INTERNAL JUGULAR DIALYSIS CATHETER;  Surgeon: Conrad Warren, MD;  Location: [place];  Service: Vascular;  Laterality: Right;  . PERIPHERAL VASCULAR BALLOON ANGIOPLASTY Right 06/25/2017   Procedure: Peripheral Vascular Balloon Angioplasty;  Surgeon: [facility] Dutch, MD;  Location: [place] CV LAB;  Service: Cardiovascular;  Laterality: Right;  . THROMBECTOMY AND REVISION OF ARTERIOVENTOUS (AV) GORETEX  GRAFT Left 03/02/2017   Procedure: THROMBECTOMY AND REVISION OF ARTERIOVENous left arm GORETEX  GRAFT;  Surgeon: Elam Dutch, MD;  Location: [place];  Service: Vascular;  Laterality: Left;  . THROMBECTOMY W/ EMBOLECTOMY Left 12/14/2016   Procedure: THROMBECTOMY OF LEFT  ARTERIO-VENOUS GORTEX GRAFT; REVISION OF LEFT  ARTERIO-VENOUS GORE-TEX GRAFT;  Surgeon: [name], MD;  Location: [place];  Service: Vascular;  Laterality: Left;        Home Medications    Prior to Admission medications   Medication Sig Start Date End Date Taking? Authorizing Provider  acetaminophen (TYLENOL) 500 MG tablet Take 1,000 mg by mouth every 6 (six) hours as needed for moderate pain or headache.   Yes [provider]  amLODipine (NORVASC) 10 MG tablet Take 10 mg by mouth 2 (two) times daily. 12/18/17  Yes [provider]  aspirin 81 MG chewable tablet Chew 81 mg by mouth daily.    Yes [provider]  calcium acetate (PHOSLO) 667 MG capsule Take 667 mg by mouth 3 (three) times daily. 03/02/16  Yes [provider]  Cholecalciferol (VITAMIN D3) 1000 units CAPS Take 2,000 Units by mouth daily.   Yes [provider]    Family History No family history on file.  Social History Social History   Tobacco Use  . Smoking status: Never Smoker  . Smokeless tobacco: Never Used  Substance Use Topics  . Alcohol use: No  . Drug use: No     Allergies   Pork-derived products and Ppd [tuberculin purified protein derivative]   Review of Systems Review of Systems  Constitutional: Negative.   HENT: Negative.   Respiratory: Negative.   Cardiovascular: Positive for chest pain.       Syncope  Gastrointestinal: Positive for vomiting.  Musculoskeletal: Negative.   Skin: Negative.   Allergic/Immunologic: Positive for immunocompromised state.       .  Peritoneal dialysis patient  Neurological: Negative.   Psychiatric/Behavioral: Negative.   All other systems reviewed and are negative.    Physical Exam Updated Vital Signs BP (!) 188/104   Pulse 98   Temp 97.6 F (36.4 C) (Oral)   Resp 16   SpO2 99%   Physical Exam  Constitutional: He appears well-developed and well-nourished.  HENT:  Head: Normocephalic and atraumatic.  Mucous membranes dry  Eyes: Conjunctivae are normal. Pupils are equal, round, and reactive to light.  Neck: Neck supple. No tracheal deviation present. No thyromegaly  present.  Cardiovascular: Normal rate and regular rhythm.  No murmur heard. Pulmonary/Chest: Effort normal and breath sounds normal.  Abdominal: Soft. Bowel sounds are normal. He exhibits no distension. There is no tenderness.  Dialysis catheter in place.  Genitourinary: Rectum normal and penis normal. Rectal exam shows guaiac negative stool.  Genitourinary Comments: Rectum normal tone brown stool no gross blood  Musculoskeletal: Normal range of motion. He exhibits no edema or tenderness.  Neurological: He is alert. Coordination normal.  Skin: Skin is warm and dry. No rash noted.  Psychiatric: He has a normal mood and affect.  Nursing note and vitals reviewed.    ED Treatments / Results  Labs (all labs ordered are listed, but only abnormal results are displayed) Labs Reviewed  COMPREHENSIVE METABOLIC PANEL - Abnormal; Notable for the following components:      Result Value   Chloride 91 (*)    Glucose, Bld 260 (*)    BUN 69 (*)    Creatinine, Ser 15.71 (*)    Calcium  8.6 (*)    Total Protein 8.9 (*)    Alkaline Phosphatase 153 (*)    GFR calc non Af Amer 3 (*)    GFR calc Af Amer 3 (*)    Anion gap 23 (*)    All other components within normal limits  CBC - Abnormal; Notable for the following components:   WBC 11.0 (*)    RBC 4.04 (*)    Hemoglobin 10.8 (*)    HCT 33.5 (*)    All other components within normal limits  LIPASE, BLOOD  URINALYSIS, ROUTINE W REFLEX MICROSCOPIC  I-STAT TROPONIN, ED  POC OCCULT BLOOD, ED   Results for orders placed or performed during the hospital encounter of 12/21/17  Lipase, blood  Result Value Ref Range   Lipase 36 11 - 51 U/L  Comprehensive metabolic panel  Result Value Ref Range   Sodium 136 135 - 145 mmol/L   Potassium 3.7 3.5 - 5.1 mmol/L   Chloride 91 (L) 101 - 111 mmol/L   CO2 22 22 - 32 mmol/L   Glucose, Bld 260 (H) 65 - 99 mg/dL   BUN 69 (H) 6 - 20 mg/dL   Creatinine, Ser 15.71 (H) 0.61 - 1.24 mg/dL   Calcium 8.6 (L) 8.9  - 10.3 mg/dL   Total Protein 8.9 (H) 6.5 - 8.1 g/dL   Albumin 4.0 3.5 - 5.0 g/dL   AST 17 15 - 41 U/L   ALT 17 17 - 63 U/L   Alkaline Phosphatase 153 (H) 38 - 126 U/L   Total Bilirubin 0.7 0.3 - 1.2 mg/dL   GFR calc non Af Amer 3 (L) >60 mL/min   GFR calc Af Amer 3 (L) >60 mL/min   Anion gap 23 (H) 5 - 15  CBC  Result Value Ref Range   WBC 11.0 (H) 4.0 - 10.5 K/uL   RBC 4.04 (L) 4.22 - 5.81 MIL/uL   Hemoglobin 10.8 (L) 13.0 - 17.0 g/dL   HCT 33.5 (L) 39.0 - 52.0 %   MCV 82.9 78.0 - 100.0 fL   MCH 26.7 26.0 - 34.0 pg   MCHC 32.2 30.0 - 36.0 g/dL   RDW 14.8 11.5 - 15.5 %   Platelets 332 150 - 400 K/uL  I-stat troponin, ED  Result Value Ref Range   Troponin i, poc 0.03 0.00 - 0.08 ng/mL   Comment 3          POC occult blood, ED  Result Value Ref Range   Fecal Occult Bld NEGATIVE NEGATIVE  Chest x-ray viewed by me Dg Chest 2 View  Result Date: 12/21/2017 CLINICAL DATA:  Right-sided chest pain, nausea and vomiting with dizziness. End-stage renal disease. EXAM: CHEST  2 VIEW COMPARISON:  10/27/2016 FINDINGS: The heart size and mediastinal contours are within normal limits. Tip of right-sided dialysis catheter is seen in the mid right atrium. Both lungs are clear. The visualized skeletal structures are unremarkable. IMPRESSION: No active cardiopulmonary disease. Tip of right-sided dialysis catheter is seen in the mid right atrium. Electronically Signed   By: Ashley Royalty M.D.   On: 12/21/2017 18:35   EKG  EKG Interpretation  Date/Time:  Tuesday December 21 2017 17:49:05 EST Ventricular Rate:  95 PR Interval:  140 QRS Duration: 86 QT Interval:  396 QTC Calculation: 497 R Axis:   33 Text Interpretation:  Normal sinus rhythm T wave abnormality, consider inferior ischemia Abnormal ECG No significant change since last tracing Confirmed by Orlie Dakin [phone]) on 12/21/2017  7:55:24 PM       Radiology Dg Chest 2 View  Result Date: 12/21/2017 CLINICAL DATA:  Right-sided chest  pain, nausea and vomiting with dizziness. End-stage renal disease. EXAM: CHEST  2 VIEW COMPARISON:  10/27/2016 FINDINGS: The heart size and mediastinal contours are within normal limits. Tip of right-sided dialysis catheter is seen in the mid right atrium. Both lungs are clear. The visualized skeletal structures are unremarkable. IMPRESSION: No active cardiopulmonary disease. Tip of right-sided dialysis catheter is seen in the mid right atrium. Electronically Signed   By: Ashley Royalty M.D.   On: 12/21/2017 18:35    Procedures Procedures (including critical care time)  Medications Ordered in ED Medications  metoCLOPramide (REGLAN) injection 10 mg (not administered)     Initial Impression / Assessment and Plan / ED Course  I have reviewed the triage vital signs and the nursing notes.  Pertinent labs & imaging results that were available during my care of the patient were reviewed by me and considered in my medical decision making (see chart for details).     I consulted nephrology service Dr. Florene Glen who will arrange for further peritoneal dialysis.  He requests admission to hospitalist service and treat conservatively with PPI.  Hospitalist service consulted 855 PM feels improved after treatment with intravenous Reglan.  Nausea is improved.  Consulted Dr.Niu hospital service will arrange for overnight stay.  Chest pain felt secondary to forceful vomiting Final Clinical Impressions(s) / ED Diagnoses  Diagnosis #1 hematemesis #2 hyperglycemia Final diagnoses:  None   #3 end-stage renal disease ED Discharge Orders    None    #4 dehydration   Orlie Dakin, MD 12/21/17 2101

## 2017-12-21 NOTE — ED Triage Notes (Signed)
Per Pt, Pt is coming from home with complaints of vomiting that started two days ago. Reports some blood streaking in his vomit. Hx of Peritoneal Dialysis and had to come off early yesterday. Complains of some slight Chest pain, mid-center.

## 2017-12-22 ENCOUNTER — Encounter (HOSPITAL_COMMUNITY): Payer: Self-pay | Admitting: General Practice

## 2017-12-22 LAB — CBC
HCT: 30.3 % — ABNORMAL LOW (ref 39.0–52.0)
Hemoglobin: 9.9 g/dL — ABNORMAL LOW (ref 13.0–17.0)
MCH: 27 pg (ref 26.0–34.0)
MCHC: 32.7 g/dL (ref 30.0–36.0)
MCV: 82.8 fL (ref 78.0–100.0)
Platelets: 321 10*3/uL (ref 150–400)
RBC: 3.66 MIL/uL — ABNORMAL LOW (ref 4.22–5.81)
RDW: 14.6 % (ref 11.5–15.5)
WBC: 12.5 10*3/uL — ABNORMAL HIGH (ref 4.0–10.5)

## 2017-12-22 LAB — IRON AND TIBC
Iron: 54 ug/dL (ref 45–182)
Saturation Ratios: 25 % (ref 17.9–39.5)
TIBC: 214 ug/dL — ABNORMAL LOW (ref 250–450)
UIBC: 160 ug/dL

## 2017-12-22 LAB — LIPID PANEL
CHOLESTEROL: 130 mg/dL (ref 0–200)
HDL: 44 mg/dL (ref >40)
LDL CALC: 72 mg/dL (ref 0–99)
TRIGLYCERIDES: 70 mg/dL (ref <150)
Total CHOL/HDL Ratio: 3 RATIO
VLDL: 14 mg/dL (ref 0–40)

## 2017-12-22 LAB — BASIC METABOLIC PANEL
Anion gap: 21 — ABNORMAL HIGH (ref 5–15)
BUN: 72 mg/dL — AB (ref 6–20)
CALCIUM: 8.1 mg/dL — AB (ref 8.9–10.3)
CO2: 25 mmol/L (ref 22–32)
CREATININE: 15.17 mg/dL — AB (ref 0.61–1.24)
Chloride: 93 mmol/L — ABNORMAL LOW (ref 101–111)
GFR calc Af Amer: 3 mL/min — ABNORMAL LOW (ref >60)
GFR calc non Af Amer: 3 mL/min — ABNORMAL LOW (ref >60)
Glucose, Bld: 210 mg/dL — ABNORMAL HIGH (ref 65–99)
Potassium: 4 mmol/L (ref 3.5–5.1)
SODIUM: 139 mmol/L (ref 135–145)

## 2017-12-22 LAB — PROTEIN, PLEURAL OR PERITONEAL FLUID

## 2017-12-22 LAB — TROPONIN I
Troponin I: 0.07 ng/mL (ref <0.03)
Troponin I: 0.06 ng/mL (ref <0.03)

## 2017-12-22 LAB — GLUCOSE, CAPILLARY
Glucose-Capillary: 125 mg/dL — ABNORMAL HIGH (ref 65–99)
Glucose-Capillary: 189 mg/dL — ABNORMAL HIGH (ref 65–99)

## 2017-12-22 LAB — BODY FLUID CELL COUNT WITH DIFFERENTIAL: Total Nucleated Cell Count, Fluid: 6 cu mm (ref 0–1000)

## 2017-12-22 LAB — PATHOLOGIST SMEAR REVIEW

## 2017-12-22 LAB — HEMOGLOBIN A1C
Hgb A1c MFr Bld: 6.7 % — ABNORMAL HIGH (ref 4.8–5.6)
Mean Plasma Glucose: 145.59 mg/dL

## 2017-12-22 LAB — INFLUENZA PANEL BY PCR (TYPE A & B)
Influenza A By PCR: NEGATIVE
Influenza B By PCR: NEGATIVE

## 2017-12-22 LAB — LACTATE DEHYDROGENASE, PLEURAL OR PERITONEAL FLUID

## 2017-12-22 LAB — HIV ANTIBODY (ROUTINE TESTING W REFLEX): HIV Screen 4th Generation wRfx: NONREACTIVE

## 2017-12-22 LAB — GLUCOSE, PLEURAL OR PERITONEAL FLUID: Glucose, Fluid: 367 mg/dL

## 2017-12-22 LAB — ALBUMIN, PLEURAL OR PERITONEAL FLUID

## 2017-12-22 LAB — FERRITIN: Ferritin: 479 ng/mL — ABNORMAL HIGH (ref 24–336)

## 2017-12-22 MED ORDER — PENTAFLUOROPROP-TETRAFLUOROETH EX AERO: 1.0000 "application " | INHALATION_SPRAY | CUTANEOUS | Status: DC | PRN

## 2017-12-22 MED ORDER — ALTEPLASE 2 MG IJ SOLR
2.0000 mg | Freq: Once | INTRAMUSCULAR | Status: AC | PRN
  Administered 2017-12-22: 4 mg

## 2017-12-22 MED ORDER — LIDOCAINE HCL (PF) 1 % IJ SOLN: 5.0000 mL | INTRAMUSCULAR | Status: DC | PRN

## 2017-12-22 MED ORDER — PROMETHAZINE HCL 25 MG/ML IJ SOLN
25.0000 mg | Freq: Four times a day (QID) | INTRAMUSCULAR | Status: DC | PRN
  Administered 2017-12-23 – 2017-12-24 (×2): 25 mg via INTRAVENOUS
  Filled 2017-12-22 (×3): qty 1

## 2017-12-22 MED ORDER — LIDOCAINE-PRILOCAINE 2.5-2.5 % EX CREA: 1.0000 "application " | TOPICAL_CREAM | CUTANEOUS | Status: DC | PRN

## 2017-12-22 MED ORDER — ALTEPLASE 2 MG IJ SOLR
INTRAMUSCULAR | Status: AC
  Administered 2017-12-22: 4 mg
  Filled 2017-12-22: qty 4

## 2017-12-22 MED ORDER — SODIUM CHLORIDE 0.9 % IV SOLN: 100.0000 mL | INTRAVENOUS | Status: DC | PRN

## 2017-12-22 MED ORDER — DARBEPOETIN ALFA 100 MCG/0.5ML IJ SOSY: 100.0000 ug | PREFILLED_SYRINGE | INTRAMUSCULAR | Status: DC

## 2017-12-22 NOTE — Procedures (Signed)
Patient was seen on dialysis and the procedure was supervised.  BFR 300  Via PC BP is  148/65.   Patient appears to be tolerating treatment well  Chau Savell A 12/22/2017

## 2017-12-22 NOTE — Progress Notes (Deleted)
Patient's RN Tish requested that I attempt to start an IV for patient's procedure. Attempted to start an IV in patient's lateral forearm IV but patient refused. It was the only potential access site. Informed patient's RN.

## 2017-12-22 NOTE — Progress Notes (Signed)
Received report on patient from off-going RN. Patient. Will continue to monitor for patient safety.

## 2017-12-22 NOTE — Progress Notes (Signed)
RN called dialysis regarding pt. Dialysis states they will add pt to list for dialysis today.

## 2017-12-22 NOTE — Progress Notes (Addendum)
PROGRESS NOTE    Allen Miller  FFM:[id number] DOB: Feb 09, 1956 DOA: 12/21/2017 PCP: [facility]   Brief Narrative: Allen Miller is a 62 y.o. male with medical history significant of hypertension, stroke, kidney cancer, ESRD on peritoneal dialysis. He presented with coffee ground emesis. Hemoglobin stable. Nausea and vomiting persistent. Nephrology consulted for dialysis management.   Assessment & Plan:   Principal Problem:   Coffee ground emesis Active Problems:   History of CVA (cerebrovascular accident)   Microcytic anemia   ESRD on dialysis ([place])   Chest pain   Coffee ground emesis Resolved. Discussed with GI and since patient's hemoglobin is stable. No need for inpatient evaluation. -CBC  Nausea/vomiting No evidence of SBP on fluid analysis. Culture pending. -Zofran -Add phenergan prn refractory N/V  ESRD on PD Patient transitioned recently to PD. Currently with dwell. -Nephrology recommendations  Anemia Likely secondary to chronic kidney disease. Possible Gi component with positive FOBT and coffee ground emesis -CBC  Chest pain Resolved. Troponin mildly elevated. EKG not suggestive of ACS. -troponin trending.   DVT prophylaxis: SCDs Code Status: Full code Family Communication: None at bedside Disposition Plan: Discharge pending improvement in N/V   Consultants:   Nephrology  Gastroenterology (telephone)  Procedures:   PD  Antimicrobials:  None    Subjective: Nauseated. Vomiting.  Objective: Vitals:   12/22/17 0130 12/22/17 0516 12/22/17 0914 12/22/17 1241  BP:  (!) 152/72 (!) 168/78 (!) 148/65  Pulse:  96 94 96  Resp:  18  20  Temp: 98.2 F (36.8 C) (!) 97.4 F (36.3 C)  97.8 F (36.6 C)  TempSrc:  Oral  Oral  SpO2:  98%  98%  Weight:      Height:        Intake/Output Summary (Last 24 hours) at 12/22/2017 1313 Last data filed at 12/21/2017 2250 Gross per 24 hour  Intake 1100 ml  Output -  Net 1100 ml   Filed  Weights   12/21/17 2004 12/21/17 2005 12/22/17 0048  Weight: 88 kg (194 lb) 88 kg (194 lb) 86.8 kg (191 lb 6.4 oz)    Examination:  General exam: Appears calm and comfortable Respiratory system: Clear to auscultation. Respiratory effort normal. Cardiovascular system: S1 & S2 heard, RRR. No murmur. Gastrointestinal system: Abdomen is nondistended, soft and nontender. No organomegaly or masses felt. Normal bowel sounds heard. Central nervous system: Alert and oriented. No focal neurological deficits. Extremities: No edema. No calf tenderness Skin: No cyanosis. No rashes Psychiatry: Judgement and insight appear normal. Mood & affect appropriate.     Data Reviewed: I have personally reviewed following labs and imaging studies  CBC: Recent Labs  Lab 12/21/17 1744 12/21/17 2120 12/22/17 0322  WBC 11.0* 11.8* 12.5*  HGB 10.8* 9.7* 9.9*  HCT 33.5* 30.5* 30.3*  MCV 82.9 83.6 82.8  PLT 332 276 570   Basic Metabolic Panel: Recent Labs  Lab 12/21/17 1744 12/22/17 0322  NA 136 139  K 3.7 4.0  CL 91* 93*  CO2 22 25  GLUCOSE 260* 210*  BUN 69* 72*  CREATININE 15.71* 15.17*  CALCIUM 8.6* 8.1*   GFR: Estimated Creatinine Clearance: 5.1 mL/min (A) (by C-G formula based on SCr of 15.17 mg/dL (H)). Liver Function Tests: Recent Labs  Lab 12/21/17 1744  AST 17  ALT 17  ALKPHOS 153*  BILITOT 0.7  PROT 8.9*  ALBUMIN 4.0   Recent Labs  Lab 12/21/17 1744  LIPASE 36   No results for input(s): AMMONIA in the last  168 hours. Coagulation Profile: Recent Labs  Lab 12/21/17 2120  INR 1.13   Cardiac Enzymes: Recent Labs  Lab 12/21/17 2120 12/22/17 0322 12/22/17 0910  TROPONINI <0.03 0.07* 0.06*   BNP (last 3 results) No results for input(s): PROBNP in the last 8760 hours. HbA1C: Recent Labs    12/22/17 0322  HGBA1C 6.7*   CBG: Recent Labs  Lab 12/22/17 0630  GLUCAP 189*   Lipid Profile: Recent Labs    12/22/17 0322  CHOL 130  HDL 44  LDLCALC 72  TRIG  70  CHOLHDL 3.0   Thyroid Function Tests: No results for input(s): TSH, T4TOTAL, FREET4, T3FREE, THYROIDAB in the last 72 hours. Anemia Panel: No results for input(s): VITAMINB12, FOLATE, FERRITIN, TIBC, IRON, RETICCTPCT in the last 72 hours. Sepsis Labs: No results for input(s): PROCALCITON, LATICACIDVEN in the last 168 hours.  Recent Results (from the past 240 hour(s))  Body fluid culture     Status: None (Preliminary result)   Collection Time: 12/21/17  8:12 PM  Result Value Ref Range Status   Specimen Description FLUID PERITONEAL  Final   Special Requests NONE  Final   Gram Stain RARE WBC PRESENT, PREDOMINANTLY MONONUCLEAR   Final   Culture PENDING  Incomplete   Report Status PENDING  Incomplete  Culture, blood (Routine X 2) w Reflex to ID Panel     Status: None (Preliminary result)   Collection Time: 12/21/17  9:35 PM  Result Value Ref Range Status   Specimen Description BLOOD LEFT HAND  Final   Special Requests   Final    BOTTLES DRAWN AEROBIC AND ANAEROBIC Blood Culture adequate volume   Culture NO GROWTH < 12 HOURS  Final   Report Status PENDING  Incomplete  Culture, blood (Routine X 2) w Reflex to ID Panel     Status: None (Preliminary result)   Collection Time: 12/21/17  9:41 PM  Result Value Ref Range Status   Specimen Description BLOOD LEFT HAND  Final   Special Requests IN PEDIATRIC BOTTLE Blood Culture adequate volume  Final   Culture NO GROWTH < 12 HOURS  Final   Report Status PENDING  Incomplete         Radiology Studies: Dg Chest 2 View  Result Date: 12/21/2017 CLINICAL DATA:  Right-sided chest pain, nausea and vomiting with dizziness. End-stage renal disease. EXAM: CHEST  2 VIEW COMPARISON:  10/27/2016 FINDINGS: The heart size and mediastinal contours are within normal limits. Tip of right-sided dialysis catheter is seen in the mid right atrium. Both lungs are clear. The visualized skeletal structures are unremarkable. IMPRESSION: No active  cardiopulmonary disease. Tip of right-sided dialysis catheter is seen in the mid right atrium. Electronically Signed   By: Ashley Royalty M.D.   On: 12/21/2017 18:35        Scheduled Meds: . amLODipine  10 mg Oral BID  . calcium acetate  667 mg Oral TID WC  . cholecalciferol  2,000 Units Oral Daily  . darbepoetin (ARANESP) injection - DIALYSIS  100 mcg Intravenous Q Wed-HD  . gentamicin cream  1 application Topical Daily  . [START ON 12/25/2017] pantoprazole  40 mg Intravenous Q12H   Continuous Infusions: . pantoprozole (PROTONIX) infusion 8 mg/hr (12/22/17 1006)     LOS: 1 day     Cordelia Poche, MD [facility] 12/22/2017, 1:13 PM Pager: [phone]  If 7PM-7AM, please contact night-coverage [email or web address] Password [email or web address] 12/22/2017, 1:13 PM

## 2017-12-22 NOTE — Progress Notes (Signed)
RN called Lab regarding pt's flu panel. Lab states they received it and it is negative.

## 2017-12-22 NOTE — Progress Notes (Signed)
Inpatient Diabetes Program Recommendations  AACE/ADA: New Consensus Statement on Inpatient Glycemic Control (2015)  Target Ranges:  Prepandial:   less than 140 mg/dL      Peak postprandial:   less than 180 mg/dL (1-2 hours)      Critically ill patients:  140 - 180 mg/dL   Lab Results  Component Value Date   GLUCAP 189 (H) 12/22/2017   HGBA1C 6.7 (H) 12/22/2017    Review of Glycemic Control Inpatient Diabetes Program Recommendations:   -Glycemic control order set with sensitive Novolog correction while in the hospital.  Thank you, Nani Gasser. Franci Oshana, RN, MSN, CDE  Diabetes Coordinator Inpatient Glycemic Control Team Team Pager [phone] (8am-5pm) 12/22/2017 2:21 PM

## 2017-12-22 NOTE — Progress Notes (Signed)
On call Hemodialysis nurse contacted patient's about peritoneal dialysis needs. Patient is currently dwelling. On call nurse said that the next shift would perform the next exchange.

## 2017-12-22 NOTE — Consult Note (Signed)
[facility] Renal Consultation Note  Requesting MD: Nettey Indication for Consultation: ESRD  HPI:  Allen Miller is a 62 y.o. male with past medical history significant for hypertension, history of CVA as well as end-stage renal disease-per his report been on dialysis since 2017.  He is followed by the [facility] in [place].  He was previously on hemodialysis.  He transitioned to peritoneal dialysis 2-3 weeks ago but states that he is not satisfied and he wants to go back to hemodialysis, unclear how far that transition has been taken.  He presented to the hospital with 10 episodes of coffee-ground emesis-has been admitted, placed on a PPI and aspirin has been discontinued.  The PD fluid was not indicative of infection.  He is currently uncomfortable because he has a dwell in and relates to me his desire to go back to hemodialysis.  He has a PermCath that does not look like it has been attended to in quite some time  Creat  Date/Time Value Ref Range Status  01/29/2016 03:46 PM 8.80 (H) 0.70 - 1.33 mg/dL Final    Comment:    Result repeated and verified. No visible hemolysis.    Creatinine, Ser  Date/Time Value Ref Range Status  12/22/2017 03:22 AM 15.17 (H) 0.61 - 1.24 mg/dL Final  12/21/2017 05:44 PM 15.71 (H) 0.61 - 1.24 mg/dL Final  06/25/2017 09:55 AM 9.40 (H) 0.61 - 1.24 mg/dL Final  08/25/2016 12:30 PM 9.05 (H) 0.61 - 1.24 mg/dL Final  04/16/2016 09:16 PM 4.05 (H) 0.61 - 1.24 mg/dL Final  01/24/2016 04:45 AM 5.72 (H) 0.61 - 1.24 mg/dL Final  01/23/2016 05:00 AM 5.94 (H) 0.61 - 1.24 mg/dL Final  01/22/2016 05:30 AM 6.06 (H) 0.61 - 1.24 mg/dL Final  01/21/2016 09:15 AM 5.80 (H) 0.61 - 1.24 mg/dL Final  01/21/2016 09:05 AM 6.03 (H) 0.61 - 1.24 mg/dL Final     PMHx:   Past Medical History:  Diagnosis Date  . Cancer [facility])    kidney cancer  . Coffee ground emesis 11/2017  . ESRD (end stage renal disease) ([place])    MWF  . Hemodialysis patient ([place])   .  Hypertension   . Low iron   . Shortness of breath dyspnea    when lying flat  . Stroke [facility])    effective vision- Left eye    Past Surgical History:  Procedure Laterality Date  . A/V FISTULAGRAM Right 06/25/2017   Procedure: A/V Fistulagram;  Surgeon: Elam Dutch, MD;  Location: [place] CV LAB;  Service: Cardiovascular;  Laterality: Right;  . AV FISTULA PLACEMENT Left 10/27/2016   Procedure: INSERTION LEFT UPPER ARM  OF ARTERIOVENOUS (AV) GORE-TEX GRAFT ARM;  Surgeon: Conrad Walnut, MD;  Location: [place];  Service: Vascular;  Laterality: Left;  . AV FISTULA PLACEMENT Right 03/23/2017   Procedure: ARTERIOVENOUS (AV) FISTULA CREATION;  Surgeon: Elam Dutch, MD;  Location: [place];  Service: Vascular;  Laterality: Right;  . BASCILIC VEIN TRANSPOSITION Left 02/24/2016   Procedure: BASILIC VEIN TRANSPOSITION VERSUS ARTERIOVENOUS GRAFT INSERTION;  Surgeon: [name], MD;  Location: [place];  Service: Vascular;  Laterality: Left;  . BASCILIC VEIN TRANSPOSITION Left 07/14/2016   Procedure: LEFT BASILIC VEIN TRANSPOSITION;  Surgeon: Conrad Morse Bluff, MD;  Location: [place];  Service: Vascular;  Laterality: Left;  . BASCILIC VEIN TRANSPOSITION Left 09/08/2016   Procedure: SECOND STAGE BASILIC VEIN TRANSPOSITION LEFT UPPER ARM;  Surgeon: Conrad Laurel, MD;  Location: [place];  Service: Vascular;  Laterality: Left;  . INSERTION OF DIALYSIS CATHETER N/A 02/24/2016   Procedure: INSERTION OF DIALYSIS CATHETER;  Surgeon: [name], MD;  Location: [place];  Service: Vascular;  Laterality: N/A;  . INSERTION OF DIALYSIS CATHETER Right 10/27/2016   Procedure: EXCHANGE OF RIGHT INTERNAL JUGULAR DIALYSIS CATHETER;  Surgeon: Conrad Joanna, MD;  Location: [place];  Service: Vascular;  Laterality: Right;  . PERIPHERAL VASCULAR BALLOON ANGIOPLASTY Right 06/25/2017   Procedure: Peripheral Vascular Balloon Angioplasty;  Surgeon: Elam Dutch, MD;  Location: [place] CV LAB;  Service: Cardiovascular;  Laterality:  Right;  . THROMBECTOMY AND REVISION OF ARTERIOVENTOUS (AV) GORETEX  GRAFT Left 03/02/2017   Procedure: THROMBECTOMY AND REVISION OF ARTERIOVENous left arm GORETEX  GRAFT;  Surgeon: Elam Dutch, MD;  Location: [place];  Service: Vascular;  Laterality: Left;  . THROMBECTOMY W/ EMBOLECTOMY Left 12/14/2016   Procedure: THROMBECTOMY OF LEFT  ARTERIO-VENOUS GORTEX GRAFT; REVISION OF LEFT  ARTERIO-VENOUS GORE-TEX GRAFT;  Surgeon: [name], MD;  Location: [place];  Service: Vascular;  Laterality: Left;    Family Hx: History reviewed. No pertinent family history.  Social History:  reports that  has never smoked. he has never used smokeless tobacco. He reports that he does not drink alcohol or use drugs.  Allergies:  Allergies  Allergen Reactions  . Pork-Derived Products Other (See Comments)    NOT AN ALLERGY > RELIGIOUS REASONS  . Ppd [Tuberculin Purified Protein Derivative] Rash    Medications: Prior to Admission medications   Medication Sig Start Date End Date Taking? Authorizing Provider  acetaminophen (TYLENOL) 500 MG tablet Take 1,000 mg by mouth every 6 (six) hours as needed for moderate pain or headache.   Yes [provider]  amLODipine (NORVASC) 10 MG tablet Take 10 mg by mouth 2 (two) times daily. 12/18/17  Yes [provider]  aspirin 81 MG chewable tablet Chew 81 mg by mouth daily.    Yes [provider]  calcium acetate (PHOSLO) 667 MG capsule Take 667 mg by mouth 3 (three) times daily. 03/02/16  Yes [provider]  Cholecalciferol (VITAMIN D3) 1000 units CAPS Take 2,000 Units by mouth daily.   Yes [provider]    I have reviewed the patient's current medications.  Labs:  Results for orders placed or performed during the hospital encounter of 12/21/17 (from the past 48 hour(s))  Lipase, blood     Status: None   Collection Time: 12/21/17  5:44 PM  Result Value Ref Range   Lipase 36 11 - 51 U/L  Comprehensive metabolic panel      Status: Abnormal   Collection Time: 12/21/17  5:44 PM  Result Value Ref Range   Sodium 136 135 - 145 mmol/L   Potassium 3.7 3.5 - 5.1 mmol/L   Chloride 91 (L) 101 - 111 mmol/L   CO2 22 22 - 32 mmol/L   Glucose, Bld 260 (H) 65 - 99 mg/dL   BUN 69 (H) 6 - 20 mg/dL   Creatinine, Ser 15.71 (H) 0.61 - 1.24 mg/dL   Calcium 8.6 (L) 8.9 - 10.3 mg/dL   Total Protein 8.9 (H) 6.5 - 8.1 g/dL   Albumin 4.0 3.5 - 5.0 g/dL   AST 17 15 - 41 U/L   ALT 17 17 - 63 U/L   Alkaline Phosphatase 153 (H) 38 - 126 U/L   Total Bilirubin 0.7 0.3 - 1.2 mg/dL   GFR calc non Af Amer 3 (L) >60 mL/min  GFR calc Af Amer 3 (L) >60 mL/min    Comment: (NOTE) The eGFR has been calculated using the CKD EPI equation. This calculation has not been validated in all clinical situations. eGFR's persistently <60 mL/min signify possible Chronic Kidney Disease.    Anion gap 23 (H) 5 - 15  CBC     Status: Abnormal   Collection Time: 12/21/17  5:44 PM  Result Value Ref Range   WBC 11.0 (H) 4.0 - 10.5 K/uL   RBC 4.04 (L) 4.22 - 5.81 MIL/uL   Hemoglobin 10.8 (L) 13.0 - 17.0 g/dL   HCT 33.5 (L) 39.0 - 52.0 %   MCV 82.9 78.0 - 100.0 fL   MCH 26.7 26.0 - 34.0 pg   MCHC 32.2 30.0 - 36.0 g/dL   RDW 14.8 11.5 - 15.5 %   Platelets 332 150 - 400 K/uL  I-stat troponin, ED     Status: None   Collection Time: 12/21/17  6:29 PM  Result Value Ref Range   Troponin i, poc 0.03 0.00 - 0.08 ng/mL   Comment 3            Comment: Due to the release kinetics of cTnI, a negative result within the first hours of the onset of symptoms does not rule out myocardial infarction with certainty. If myocardial infarction is still suspected, repeat the test at appropriate intervals.   POC occult blood, ED     Status: None   Collection Time: 12/21/17  7:52 PM  Result Value Ref Range   Fecal Occult Bld NEGATIVE NEGATIVE  Body fluid culture     Status: None (Preliminary result)   Collection Time: 12/21/17  8:12 PM  Result Value Ref Range    Specimen Description FLUID PERITONEAL    Special Requests NONE    Gram Stain RARE WBC PRESENT, PREDOMINANTLY MONONUCLEAR     Culture PENDING    Report Status PENDING   CBC     Status: Abnormal   Collection Time: 12/21/17  9:20 PM  Result Value Ref Range   WBC 11.8 (H) 4.0 - 10.5 K/uL   RBC 3.65 (L) 4.22 - 5.81 MIL/uL   Hemoglobin 9.7 (L) 13.0 - 17.0 g/dL   HCT 30.5 (L) 39.0 - 52.0 %   MCV 83.6 78.0 - 100.0 fL   MCH 26.6 26.0 - 34.0 pg   MCHC 31.8 30.0 - 36.0 g/dL   RDW 14.6 11.5 - 15.5 %   Platelets 276 150 - 400 K/uL  Protime-INR     Status: None   Collection Time: 12/21/17  9:20 PM  Result Value Ref Range   Prothrombin Time 14.4 11.4 - 15.2 seconds   INR 1.13   APTT     Status: None   Collection Time: 12/21/17  9:20 PM  Result Value Ref Range   aPTT 30 24 - 36 seconds  Troponin I (q 6hr x 3)     Status: None   Collection Time: 12/21/17  9:20 PM  Result Value Ref Range   Troponin I <0.03 <0.03 ng/mL  Culture, blood (Routine X 2) w Reflex to ID Panel     Status: None (Preliminary result)   Collection Time: 12/21/17  9:35 PM  Result Value Ref Range   Specimen Description BLOOD LEFT HAND    Special Requests      BOTTLES DRAWN AEROBIC AND ANAEROBIC Blood Culture adequate volume   Culture NO GROWTH < 12 HOURS    Report Status PENDING   Culture, blood (  Routine X 2) w Reflex to ID Panel     Status: None (Preliminary result)   Collection Time: 12/21/17  9:41 PM  Result Value Ref Range   Specimen Description BLOOD LEFT HAND    Special Requests IN PEDIATRIC BOTTLE Blood Culture adequate volume    Culture NO GROWTH < 12 HOURS    Report Status PENDING   Type and screen [facility]     Status: None   Collection Time: 12/21/17  9:41 PM  Result Value Ref Range   ABO/RH(D) A POS    Antibody Screen NEG    Sample Expiration 12/24/2017   ABO/Rh     Status: None   Collection Time: 12/21/17  9:41 PM  Result Value Ref Range   ABO/RH(D) A POS   Body fluid cell  count with differential     Status: Abnormal   Collection Time: 12/21/17 11:10 PM  Result Value Ref Range   Fluid Type-FCT FLUID     Comment: PERITONEAL CORRECTED ON 01/23 AT 0006: PREVIOUSLY REPORTED AS Peritoneal    Color, Fluid COLORLESS (A) YELLOW   Appearance, Fluid CLEAR CLEAR   WBC, Fluid 6 0 - 1,000 cu mm   Other Cells, Fluid TOO FEW TO COUNT, SMEAR AVAILABLE FOR REVIEW %    Comment: Few monos, rare lymphs.  Glucose, pleural or peritoneal fluid     Status: None   Collection Time: 12/21/17 11:10 PM  Result Value Ref Range   Glucose, Fluid 367 mg/dL    Comment: (NOTE) No normal range established for this test Results should be evaluated in conjunction with serum values    Fluid Type-FGLU FLUID     Comment: PERITONEAL CORRECTED ON 01/23 AT 0014: PREVIOUSLY REPORTED AS PERITONEAL CAVITY   Protein, pleural or peritoneal fluid     Status: None   Collection Time: 12/21/17 11:10 PM  Result Value Ref Range   Total protein, fluid (NOTE) g/dL    Comment: No normal range established for this test Results should be evaluated in conjunction with serum values    Fluid Type-FTP FLUID     Comment: PERITONEAL CORRECTED ON 01/23 AT 0014: PREVIOUSLY REPORTED AS PERITONEAL CAVITY   Lactate dehydrogenase (pleural or peritoneal fluid)     Status: None   Collection Time: 12/21/17 11:10 PM  Result Value Ref Range   LD, Fluid (NOTE) 3 - 23 U/L    Comment: Results should be evaluated in conjunction with serum values   Fluid Type-FLDH FLUID     Comment: PERITONEAL CORRECTED ON 01/23 AT 0014: PREVIOUSLY REPORTED AS PERITONEAL CAVITY   Albumin, pleural or peritoneal fluid     Status: None   Collection Time: 12/21/17 11:10 PM  Result Value Ref Range   Albumin, Fluid (NOTE) g/dL    Comment: No normal range established for this test Results should be evaluated in conjunction with serum values    Fluid Type-FALB FLUID     Comment: PERITONEAL CORRECTED ON 01/23 AT 0014: PREVIOUSLY REPORTED  AS PERITONEAL CAVITY   CBC     Status: Abnormal   Collection Time: 12/22/17  3:22 AM  Result Value Ref Range   WBC 12.5 (H) 4.0 - 10.5 K/uL   RBC 3.66 (L) 4.22 - 5.81 MIL/uL   Hemoglobin 9.9 (L) 13.0 - 17.0 g/dL   HCT 30.3 (L) 39.0 - 52.0 %   MCV 82.8 78.0 - 100.0 fL   MCH 27.0 26.0 - 34.0 pg   MCHC 32.7 30.0 - 36.0 g/dL  RDW 14.6 11.5 - 15.5 %   Platelets 321 150 - 400 K/uL  Troponin I (q 6hr x 3)     Status: Abnormal   Collection Time: 12/22/17  3:22 AM  Result Value Ref Range   Troponin I 0.07 (HH) <0.03 ng/mL    Comment: CRITICAL RESULT CALLED TO, READ BACK BY AND VERIFIED WITH: TO JICHELLE(RN) BY TCLEVELAND 12/22/17 AT 5:18AM   Hemoglobin A1c     Status: Abnormal   Collection Time: 12/22/17  3:22 AM  Result Value Ref Range   Hgb A1c MFr Bld 6.7 (H) 4.8 - 5.6 %    Comment: (NOTE) Pre diabetes:          5.7%-6.4% Diabetes:              >6.4% Glycemic control for   <7.0% adults with diabetes    Mean Plasma Glucose 145.59 mg/dL  Lipid panel     Status: None   Collection Time: 12/22/17  3:22 AM  Result Value Ref Range   Cholesterol 130 0 - 200 mg/dL   Triglycerides 70 <150 mg/dL   HDL 44 >40 mg/dL   Total CHOL/HDL Ratio 3.0 RATIO   VLDL 14 0 - 40 mg/dL   LDL Cholesterol 72 0 - 99 mg/dL    Comment:        Total Cholesterol/HDL:CHD Risk Coronary Heart Disease Risk Table                     Men   Women  1/2 Average Risk   3.4   3.3  Average Risk       5.0   4.4  2 X Average Risk   9.6   7.1  3 X Average Risk  23.4   11.0        Use the calculated Patient Ratio above and the CHD Risk Table to determine the patient's CHD Risk.        ATP III CLASSIFICATION (LDL):  <100     mg/dL   Optimal  100-129  mg/dL   Near or Above                    Optimal  130-159  mg/dL   Borderline  160-189  mg/dL   High  >190     mg/dL   Very High   Basic metabolic panel     Status: Abnormal   Collection Time: 12/22/17  3:22 AM  Result Value Ref Range   Sodium 139 135 - 145  mmol/L   Potassium 4.0 3.5 - 5.1 mmol/L   Chloride 93 (L) 101 - 111 mmol/L   CO2 25 22 - 32 mmol/L   Glucose, Bld 210 (H) 65 - 99 mg/dL   BUN 72 (H) 6 - 20 mg/dL   Creatinine, Ser 15.17 (H) 0.61 - 1.24 mg/dL   Calcium 8.1 (L) 8.9 - 10.3 mg/dL   GFR calc non Af Amer 3 (L) >60 mL/min   GFR calc Af Amer 3 (L) >60 mL/min    Comment: (NOTE) The eGFR has been calculated using the CKD EPI equation. This calculation has not been validated in all clinical situations. eGFR's persistently <60 mL/min signify possible Chronic Kidney Disease.    Anion gap 21 (H) 5 - 15  Glucose, capillary     Status: Abnormal   Collection Time: 12/22/17  6:30 AM  Result Value Ref Range   Glucose-Capillary 189 (H) 65 - 99 mg/dL  Troponin I (  q 6hr x 3)     Status: Abnormal   Collection Time: 12/22/17  9:10 AM  Result Value Ref Range   Troponin I 0.06 (HH) <0.03 ng/mL    Comment: CRITICAL VALUE NOTED.  VALUE IS CONSISTENT WITH PREVIOUSLY REPORTED AND CALLED VALUE.  Influenza panel by PCR (type A & B)     Status: None   Collection Time: 12/22/17  9:51 AM  Result Value Ref Range   Influenza A By PCR NEGATIVE NEGATIVE   Influenza B By PCR NEGATIVE NEGATIVE    Comment: (NOTE) The Xpert Xpress Flu assay is intended as an aid in the diagnosis of  influenza and should not be used as a sole basis for treatment.  This  assay is FDA approved for nasopharyngeal swab specimens only. Nasal  washings and aspirates are unacceptable for Xpert Xpress Flu testing.      ROS:  A comprehensive review of systems was negative except for: Constitutional: positive for fatigue Gastrointestinal: positive for abdominal pain, nausea, vomiting and Coffee-ground emesis  Physical Exam: Vitals:   12/22/17 0516 12/22/17 0914  BP: (!) 152/72 (!) 168/78  Pulse: 96 94  Resp: 18   Temp: (!) 97.4 F (36.3 C)   SpO2: 98%      General: Some distress secondary to abdominal discomfort, increased distention from PD fluid-but has emesis  bag bedside HEENT: Pupils equal round reactive to light, extra ocular motions are intact, mucous memories moist  Neck: No JVD Heart: Slightly tachycardic-has a right-sided PermCath in place that is dirty-he is not sure when it was last tended to Lungs: Poor effort, decreased breath sounds at the bases Abdomen: Distended, uncomfortable.  No guarding or rebound Extremities: Minimal edema Skin: Warm and dry Neuro: Uncomfortable but does answer my questions  Assessment/Plan: 62 year old black male with [facility] but now wants to transition back to hemodialysis.  He is admitted for coffee-ground emesis 1.Renal-ESRD-wants to transition from PD back to hemo but continue to receive that through the [facility] in [place].  He currently has fluid in.  I will contact the PD nurses to drain his fluid and will write dialysis orders for today.  I am not sure this PermCath will work.  He reports attempted access in the past that has been unsuccessful.  Is not likely we will be able to get PD catheter removed this hospitalization.  I may defer to the [facility].  I will also need to contact them to make sure that he has an outpatient spot when ready for discharge 2. Hypertension/volume  -blood pressure is reasonable.  On amlodipine as outpatient and is ordered here 3. Anemia  -due to CKD but also evidence of recent GI bleed.  Last hemoglobin 9.9.  Fecal occult positive.  GI has been consulted.  Holding aspirin, on PPI-plan per them.  Will add ESA 4.  Bones-we will continue outpatient PhosLo.  We will check phosphorus and intact PTH and treat as needed   Bryelle Spiewak A 12/22/2017, 12:09 PM

## 2017-12-22 NOTE — Progress Notes (Signed)
Pt had 8 beat run of vtach. Pt asymptomatic lying in bed resting. Vitals 162/72 HR 98. Pt denies having chest pain. Lamar Blinks, NP notified. Will continue to monitor.

## 2017-12-22 NOTE — Progress Notes (Signed)
Pt's family asked RN when dialysis would occur. RN called dialysis. Dialysis RN states she will come after she finishes with the pts she is currently with. Pt is also vomiting. Pt refuses nausea medication. Pt states, "I want to get this out of my stomach." MD notified.

## 2017-12-22 NOTE — Progress Notes (Signed)
Pt HD catheter TPA for 30 mins before initiating HD tx; arterial port not aspirating; Dr. Corliss Parish, Nephrologist made aware

## 2017-12-23 ENCOUNTER — Inpatient Hospital Stay (HOSPITAL_COMMUNITY): Payer: BLUE CROSS/BLUE SHIELD

## 2017-12-23 LAB — HEPATITIS B SURFACE ANTIGEN
HEP B S AG: NEGATIVE
Hepatitis B Surface Ag: NEGATIVE

## 2017-12-23 LAB — CBC
HCT: 32.3 % — ABNORMAL LOW (ref 39.0–52.0)
Hemoglobin: 10.1 g/dL — ABNORMAL LOW (ref 13.0–17.0)
MCH: 26.4 pg (ref 26.0–34.0)
MCHC: 31.3 g/dL (ref 30.0–36.0)
MCV: 84.3 fL (ref 78.0–100.0)
PLATELETS: 277 10*3/uL (ref 150–400)
RBC: 3.83 MIL/uL — ABNORMAL LOW (ref 4.22–5.81)
RDW: 15.1 % (ref 11.5–15.5)
WBC: 9.2 10*3/uL (ref 4.0–10.5)

## 2017-12-23 LAB — PARATHYROID HORMONE, INTACT (NO CA): PTH: 784 pg/mL — ABNORMAL HIGH (ref 15–65)

## 2017-12-23 LAB — GLUCOSE, CAPILLARY: Glucose-Capillary: 165 mg/dL — ABNORMAL HIGH (ref 65–99)

## 2017-12-23 LAB — HEPATITIS B CORE ANTIBODY, IGM: Hep B C IgM: NEGATIVE

## 2017-12-23 LAB — HEPATITIS B SURFACE ANTIBODY,QUALITATIVE: Hep B S Ab: NONREACTIVE

## 2017-12-23 MED ORDER — PANTOPRAZOLE SODIUM 40 MG PO TBEC
40.0000 mg | DELAYED_RELEASE_TABLET | Freq: Every day | ORAL | Status: DC
  Administered 2017-12-23 – 2017-12-25 (×3): 40 mg via ORAL
  Filled 2017-12-23 (×3): qty 1

## 2017-12-23 MED ORDER — SODIUM CHLORIDE 0.9 % IV SOLN
125.0000 mg | Freq: Every day | INTRAVENOUS | Status: AC
  Administered 2017-12-23 – 2017-12-26 (×4): 125 mg via INTRAVENOUS
  Filled 2017-12-23 (×7): qty 10

## 2017-12-23 MED ORDER — DARBEPOETIN ALFA 100 MCG/0.5ML IJ SOSY
100.0000 ug | PREFILLED_SYRINGE | INTRAMUSCULAR | Status: DC
  Filled 2017-12-23: qty 0.5

## 2017-12-23 NOTE — Progress Notes (Signed)
Pt has scheduled Aranesp at 1800 on the 23rd. Pt was on dialysis before the night shift started. Pt returned to the unit very late, and not sure if he received the medication at the dialysis or not. Pharmacist was notified of the medication, and he stated that the patient is supposed to receive the medicine from the dialysis. And nobody answered my call from the dialysis unit when I called. Will follow up in am.

## 2017-12-23 NOTE — Progress Notes (Signed)
Subjective:  PD fluid drained- had HD last night - removed 2200- tolerated well  Objective Vital signs in last 24 hours: Vitals:   12/22/17 2110 12/22/17 2127 12/22/17 2239 12/23/17 0427  BP: 116/73 (!) 174/83 (!) 163/75 (!) 159/63  Pulse: 94 98 100 98  Resp: 12 14 16 18   Temp:  98.5 F (36.9 C) 98.6 F (37 C) 98.4 F (36.9 C)  TempSrc:  Oral Oral Oral  SpO2:  98% 98% 99%  Weight:  81.8 kg (180 lb 5.4 oz)  80.8 kg (178 lb 1.6 oz)  Height:       Weight change: -6.198 kg (-10.6 oz)  Intake/Output Summary (Last 24 hours) at 12/23/2017 0736 Last data filed at 12/22/2017 2127 Gross per 24 hour  Intake 417.08 ml  Output 2185 ml  Net -1767.92 ml     Assessment/Plan: 62 year old black male with ESRD but managed through the [facility] in [place] attempted on PD but now wants to transition back to hemodialysis.  He is admitted for coffee-ground emesis 1.Renal-ESRD-wants to transition from PD back to hemo  receives that through the [facility] in [place].  He has been drained and we did HD yesterday via PC.  He reports attempted access in the past that has been unsuccessful.  Is not likely we will be able to get PD catheter removed this hospitalization.  I may defer to the [facility].  I will also need to contact them to make sure that he has an outpatient hemodialysis spot when ready for discharge.  Will plan next HD for tomorrow 1/25 2. Hypertension/volume  -blood pressure is higher.  On amlodipine as outpatient and is ordered here- UF as able with HD 3. Anemia  -due to CKD but also evidence of recent GI bleed.  hgb pretty stable around 10 since 1/22.  Fecal occult positive.  GI has been consulted ?  Holding aspirin, on PPI-plan per them.  Have added ESA- iron  Sat 25- will add some iron as well  4.  Bones-we will continue outpatient PhosLo.  We will check phosphorus and intact PTH and treat as needed    Dionicio Shelnutt A    Labs: Basic Metabolic Panel: Recent Labs  Lab 12/21/17 1744  12/22/17 0322  NA 136 139  K 3.7 4.0  CL 91* 93*  CO2 22 25  GLUCOSE 260* 210*  BUN 69* 72*  CREATININE 15.71* 15.17*  CALCIUM 8.6* 8.1*   Liver Function Tests: Recent Labs  Lab 12/21/17 1744  AST 17  ALT 17  ALKPHOS 153*  BILITOT 0.7  PROT 8.9*  ALBUMIN 4.0   Recent Labs  Lab 12/21/17 1744  LIPASE 36   No results for input(s): AMMONIA in the last 168 hours. CBC: Recent Labs  Lab 12/21/17 1744 12/21/17 2120 12/22/17 0322 12/23/17 0453  WBC 11.0* 11.8* 12.5* 9.2  HGB 10.8* 9.7* 9.9* 10.1*  HCT 33.5* 30.5* 30.3* 32.3*  MCV 82.9 83.6 82.8 84.3  PLT 332 276 321 277   Cardiac Enzymes: Recent Labs  Lab 12/21/17 2120 12/22/17 0322 12/22/17 0910  TROPONINI <0.03 0.07* 0.06*   CBG: Recent Labs  Lab 12/22/17 0630 12/22/17 2350 12/23/17 0623  GLUCAP 189* 125* 165*    Iron Studies:  Recent Labs    12/22/17 1752  IRON 54  TIBC 214*  FERRITIN 479*   Studies/Results: Dg Chest 2 View  Result Date: 12/21/2017 CLINICAL DATA:  Right-sided chest pain, nausea and vomiting with dizziness. End-stage renal disease. EXAM: CHEST  2 VIEW COMPARISON:  10/27/2016 FINDINGS: The heart size and mediastinal contours are within normal limits. Tip of right-sided dialysis catheter is seen in the mid right atrium. Both lungs are clear. The visualized skeletal structures are unremarkable. IMPRESSION: No active cardiopulmonary disease. Tip of right-sided dialysis catheter is seen in the mid right atrium. Electronically Signed   By: Ashley Royalty M.D.   On: 12/21/2017 18:35   Medications: Infusions: . pantoprozole (PROTONIX) infusion 8 mg/hr (12/22/17 1006)    Scheduled Medications: . amLODipine  10 mg Oral BID  . calcium acetate  667 mg Oral TID WC  . cholecalciferol  2,000 Units Oral Daily  . darbepoetin (ARANESP) injection - DIALYSIS  100 mcg Intravenous Q Wed-HD  . gentamicin cream  1 application Topical Daily  . [START ON 12/25/2017] pantoprazole  40 mg Intravenous Q12H     have reviewed scheduled and prn medications.  Physical Exam: General: NAD Heart: tachy Lungs: mostly clear Abdomen: obese, soft, non tender Extremities: minimal edema Dialysis Access: right IJ PC     12/23/2017,7:36 AM  LOS: 2 days

## 2017-12-23 NOTE — Progress Notes (Signed)
Pt came back from dialysis and his IV site has almost came off completely and was discontinued. Two IV nurses attempted to restart the IV without success. Will let the day shift nurse be aware.

## 2017-12-23 NOTE — Progress Notes (Signed)
PROGRESS NOTE    Allen Miller  VWP:[id number] DOB: Dec 20, 1955 DOA: 12/21/2017 PCP: [facility]   Brief Narrative: Allen Miller is a 62 y.o. male with medical history significant of hypertension, stroke, kidney cancer, ESRD on peritoneal dialysis. He presented with coffee ground emesis. Hemoglobin stable. Nausea and vomiting persistent. Nephrology consulted for dialysis management.   Assessment & Plan:   Principal Problem:   Coffee ground emesis Active Problems:   History of CVA (cerebrovascular accident)   Microcytic anemia   ESRD on dialysis ([place])   Chest pain   Coffee ground emesis Resolved. Discussed with GI and since patient's hemoglobin is stable. No need for inpatient evaluation. -outpatient follow-up for colonoscopy  Nausea/vomiting No evidence of SBP on fluid analysis. Culture pending but negative to date. Improved. -Zofran -Continue phenergan prn refractory N/V  ESRD on PD Patient transitioned recently to PD. Transitioning back to HD. S/p HD session 1/23. -Nephrology recommendations  Anemia Likely secondary to chronic kidney disease. Possible GI component with positive FOBT and coffee ground emesis. Hemoglobin stable.  Chest pain Resolved. Troponin mildly elevated. EKG not suggestive of ACS. -troponin trending.  Frothy sputum Per patient he is couching up sputum. No emesis. Diminished breath sounds without rales. -Chest x-ray   DVT prophylaxis: SCDs Code Status: Full code Family Communication: None at bedside Disposition Plan: Discharge pending improvement in N/V   Consultants:   Nephrology  Gastroenterology (telephone)  Procedures:   PD  Antimicrobials:  None    Subjective: Nauseated without vomiting but is having productive sputum which is frothy.  Objective: Vitals:   12/22/17 2239 12/23/17 0427 12/23/17 0838 12/23/17 0939  BP: (!) 163/75 (!) 159/63 (!) 163/74 (!) 147/60  Pulse: 100 98 (!) 101 96  Resp: 16 18 17     Temp: 98.6 F (37 C) 98.4 F (36.9 C) 98.4 F (36.9 C)   TempSrc: Oral Oral Oral   SpO2: 98% 99% 100% 99%  Weight:  80.8 kg (178 lb 1.6 oz)    Height:        Intake/Output Summary (Last 24 hours) at 12/23/2017 1056 Last data filed at 12/22/2017 2127 Gross per 24 hour  Intake 417.08 ml  Output 2185 ml  Net -1767.92 ml   Filed Weights   12/22/17 0048 12/22/17 2127 12/23/17 0427  Weight: 86.8 kg (191 lb 6.4 oz) 81.8 kg (180 lb 5.4 oz) 80.8 kg (178 lb 1.6 oz)    Examination:  General exam: Appears calm and comfortable Respiratory system: Clear to auscultation. Diminished. Respiratory effort normal. Cardiovascular system: S1 & S2 heard, RRR. No murmur. Gastrointestinal system: Abdomen is nondistended, soft and nontender. Normal bowel sounds heard. Central nervous system: Alert and oriented. No focal neurological deficits. Extremities: No edema. No calf tenderness Skin: No cyanosis. No rashes Psychiatry: Judgement and insight appear normal. Mood & affect appropriate.     Data Reviewed: I have personally reviewed following labs and imaging studies  CBC: Recent Labs  Lab 12/21/17 1744 12/21/17 2120 12/22/17 0322 12/23/17 0453  WBC 11.0* 11.8* 12.5* 9.2  HGB 10.8* 9.7* 9.9* 10.1*  HCT 33.5* 30.5* 30.3* 32.3*  MCV 82.9 83.6 82.8 84.3  PLT 332 276 321 374   Basic Metabolic Panel: Recent Labs  Lab 12/21/17 1744 12/22/17 0322  NA 136 139  K 3.7 4.0  CL 91* 93*  CO2 22 25  GLUCOSE 260* 210*  BUN 69* 72*  CREATININE 15.71* 15.17*  CALCIUM 8.6* 8.1*   GFR: Estimated Creatinine Clearance: 5 mL/min (A) (by C-G  formula based on SCr of 15.17 mg/dL (H)). Liver Function Tests: Recent Labs  Lab 12/21/17 1744  AST 17  ALT 17  ALKPHOS 153*  BILITOT 0.7  PROT 8.9*  ALBUMIN 4.0   Recent Labs  Lab 12/21/17 1744  LIPASE 36   No results for input(s): AMMONIA in the last 168 hours. Coagulation Profile: Recent Labs  Lab 12/21/17 2120  INR 1.13   Cardiac  Enzymes: Recent Labs  Lab 12/21/17 2120 12/22/17 0322 12/22/17 0910  TROPONINI <0.03 0.07* 0.06*   BNP (last 3 results) No results for input(s): PROBNP in the last 8760 hours. HbA1C: Recent Labs    12/22/17 0322  HGBA1C 6.7*   CBG: Recent Labs  Lab 12/22/17 0630 12/22/17 2350 12/23/17 0623  GLUCAP 189* 125* 165*   Lipid Profile: Recent Labs    12/22/17 0322  CHOL 130  HDL 44  LDLCALC 72  TRIG 70  CHOLHDL 3.0   Thyroid Function Tests: No results for input(s): TSH, T4TOTAL, FREET4, T3FREE, THYROIDAB in the last 72 hours. Anemia Panel: Recent Labs    12/22/17 1752  FERRITIN 479*  TIBC 214*  IRON 54   Sepsis Labs: No results for input(s): PROCALCITON, LATICACIDVEN in the last 168 hours.  Recent Results (from the past 240 hour(s))  Body fluid culture     Status: None (Preliminary result)   Collection Time: 12/21/17  8:12 PM  Result Value Ref Range Status   Specimen Description FLUID PERITONEAL  Final   Special Requests NONE  Final   Gram Stain RARE WBC PRESENT, PREDOMINANTLY MONONUCLEAR   Final   Culture NO GROWTH 1 DAY  Final   Report Status PENDING  Incomplete  Culture, blood (Routine X 2) w Reflex to ID Panel     Status: None (Preliminary result)   Collection Time: 12/21/17  9:35 PM  Result Value Ref Range Status   Specimen Description BLOOD LEFT HAND  Final   Special Requests   Final    BOTTLES DRAWN AEROBIC AND ANAEROBIC Blood Culture adequate volume   Culture NO GROWTH 2 DAYS  Final   Report Status PENDING  Incomplete  Culture, blood (Routine X 2) w Reflex to ID Panel     Status: None (Preliminary result)   Collection Time: 12/21/17  9:41 PM  Result Value Ref Range Status   Specimen Description BLOOD LEFT HAND  Final   Special Requests IN PEDIATRIC BOTTLE Blood Culture adequate volume  Final   Culture NO GROWTH 2 DAYS  Final   Report Status PENDING  Incomplete         Radiology Studies: Dg Chest 2 View  Result Date:  12/21/2017 CLINICAL DATA:  Right-sided chest pain, nausea and vomiting with dizziness. End-stage renal disease. EXAM: CHEST  2 VIEW COMPARISON:  10/27/2016 FINDINGS: The heart size and mediastinal contours are within normal limits. Tip of right-sided dialysis catheter is seen in the mid right atrium. Both lungs are clear. The visualized skeletal structures are unremarkable. IMPRESSION: No active cardiopulmonary disease. Tip of right-sided dialysis catheter is seen in the mid right atrium. Electronically Signed   By: Ashley Royalty M.D.   On: 12/21/2017 18:35        Scheduled Meds: . amLODipine  10 mg Oral BID  . calcium acetate  667 mg Oral TID WC  . cholecalciferol  2,000 Units Oral Daily  . darbepoetin (ARANESP) injection - DIALYSIS  100 mcg Intravenous Q Thu-HD  . gentamicin cream  1 application Topical Daily  . [  START ON 12/25/2017] pantoprazole  40 mg Intravenous Q12H   Continuous Infusions: . ferric gluconate (FERRLECIT/NULECIT) IV 125 mg (12/23/17 1020)  . pantoprozole (PROTONIX) infusion 8 mg/hr (12/23/17 0941)     LOS: 2 days     Cordelia Poche, MD [facility] 12/23/2017, 10:56 AM Pager: [phone]  If 7PM-7AM, please contact night-coverage [email or web address] Password [facility] 12/23/2017, 10:56 AM

## 2017-12-24 DIAGNOSIS — R0689 Other abnormalities of breathing: Secondary | ICD-10-CM

## 2017-12-24 LAB — RENAL FUNCTION PANEL
ALBUMIN: 3.2 g/dL — AB (ref 3.5–5.0)
ANION GAP: 17 — AB (ref 5–15)
BUN: 57 mg/dL — ABNORMAL HIGH (ref 6–20)
CHLORIDE: 95 mmol/L — AB (ref 101–111)
CO2: 25 mmol/L (ref 22–32)
Calcium: 8.4 mg/dL — ABNORMAL LOW (ref 8.9–10.3)
Creatinine, Ser: 11.22 mg/dL — ABNORMAL HIGH (ref 0.61–1.24)
GFR calc Af Amer: 5 mL/min — ABNORMAL LOW (ref >60)
GFR calc non Af Amer: 4 mL/min — ABNORMAL LOW (ref >60)
Glucose, Bld: 131 mg/dL — ABNORMAL HIGH (ref 65–99)
PHOSPHORUS: 5.7 mg/dL — AB (ref 2.5–4.6)
POTASSIUM: 3.8 mmol/L (ref 3.5–5.1)
Sodium: 137 mmol/L (ref 135–145)

## 2017-12-24 LAB — HEPATITIS B CORE ANTIBODY, TOTAL: HEP B C TOTAL AB: NEGATIVE

## 2017-12-24 LAB — CBC
HEMATOCRIT: 30.6 % — AB (ref 39.0–52.0)
HEMOGLOBIN: 9.8 g/dL — AB (ref 13.0–17.0)
MCH: 27.1 pg (ref 26.0–34.0)
MCHC: 32 g/dL (ref 30.0–36.0)
MCV: 84.5 fL (ref 78.0–100.0)
Platelets: 282 10*3/uL (ref 150–400)
RBC: 3.62 MIL/uL — ABNORMAL LOW (ref 4.22–5.81)
RDW: 14.8 % (ref 11.5–15.5)
WBC: 8.1 10*3/uL (ref 4.0–10.5)

## 2017-12-24 LAB — HEPATITIS B SURFACE ANTIBODY,QUALITATIVE: HEP B S AB: NONREACTIVE

## 2017-12-24 LAB — GLUCOSE, CAPILLARY: Glucose-Capillary: 138 mg/dL — ABNORMAL HIGH (ref 65–99)

## 2017-12-24 MED ORDER — PANTOPRAZOLE SODIUM 40 MG PO TBEC: 40.0000 mg | DELAYED_RELEASE_TABLET | Freq: Every day | ORAL | 0 refills | Status: DC

## 2017-12-24 MED ORDER — DARBEPOETIN ALFA 100 MCG/0.5ML IJ SOSY
100.0000 ug | PREFILLED_SYRINGE | INTRAMUSCULAR | Status: DC
  Administered 2017-12-24: 100 ug via INTRAVENOUS

## 2017-12-24 MED ORDER — DARBEPOETIN ALFA 100 MCG/0.5ML IJ SOSY
PREFILLED_SYRINGE | INTRAMUSCULAR | Status: AC
  Administered 2017-12-24: 100 ug via INTRAVENOUS
  Filled 2017-12-24: qty 0.5

## 2017-12-24 MED ORDER — CALCIUM CARBONATE ANTACID 500 MG PO CHEW
1.0000 | CHEWABLE_TABLET | Freq: Every day | ORAL | Status: DC | PRN
  Administered 2017-12-24 – 2017-12-25 (×2): 200 mg via ORAL
  Filled 2017-12-24 (×2): qty 1

## 2017-12-24 MED ORDER — ACETAMINOPHEN 325 MG PO TABS: 650.0000 mg | ORAL_TABLET | Freq: Four times a day (QID) | ORAL | Status: AC | PRN

## 2017-12-24 MED ORDER — CALCITRIOL 0.5 MCG PO CAPS
0.5000 ug | ORAL_CAPSULE | ORAL | Status: DC
  Administered 2017-12-24: 0.5 ug via ORAL
  Filled 2017-12-24: qty 1

## 2017-12-24 MED ORDER — CALCITRIOL 0.5 MCG PO CAPS: 0.5000 ug | ORAL_CAPSULE | ORAL | 0 refills | Status: DC

## 2017-12-24 MED ORDER — ONDANSETRON 4 MG PO TBDP: 4.0000 mg | ORAL_TABLET | Freq: Three times a day (TID) | ORAL | 0 refills | Status: DC | PRN

## 2017-12-24 NOTE — Discharge Instructions (Addendum)
Esophagitis Esophagitis is inflammation of the esophagus. The esophagus is the tube that carries food and liquids from your mouth to your stomach. Esophagitis can cause soreness or pain in the esophagus. This condition can make it difficult and painful to swallow. What are the causes? Most causes of esophagitis are not serious. Common causes of this condition include:  Gastroesophageal reflux disease (GERD). This is when stomach contents move back up into the esophagus (reflux).  Repeated vomiting.  An allergic-type reaction, especially caused by food allergies (eosinophilic esophagitis).  Injury to the esophagus by swallowing large pills with or without water, or swallowing certain types of medicines.  Swallowing (ingesting) harmful chemicals, such as household cleaning products.  Heavy alcohol use.  An infection of the esophagus.This most often occurs in people who have a weakened immune system.  Radiation or chemotherapy treatment for cancer.  Certain diseases such as sarcoidosis, Crohn disease, and scleroderma.  What are the signs or symptoms? Symptoms of this condition include:  Difficult or painful swallowing.  Pain with swallowing acidic liquids, such as citrus juices.  Pain with burping.  Chest pain.  Difficulty breathing.  Nausea.  Vomiting.  Pain in the abdomen.  Weight loss.  Ulcers in the mouth.  Patches of white material in the mouth (candidiasis).  Fever.  Coughing up blood or vomiting blood.  Stool that is black, tarry, or bright red.  How is this diagnosed? Your health care provider will take a medical history and perform a physical exam. You may also have other tests, including:  An endoscopy to examine your stomach and esophagus with a small camera.  A test that measures the acidity level in your esophagus.  A test that measures how much pressure is on your esophagus.  A barium swallow or modified barium swallow to show the shape,  size, and functioning of your esophagus.  Allergy tests.  How is this treated? Treatment for this condition depends on the cause of your esophagitis. In some cases, steroids or other medicines may be given to help relieve your symptoms or to treat the underlying cause of your condition. You may have to make some lifestyle changes, such as:  Avoiding alcohol.  Quitting smoking.  Changing your diet.  Exercising.  Changing your sleep habits and your sleep environment.  Follow these instructions at home: Take these actions to decrease your discomfort and to help avoid complications. Diet  Follow a diet as recommended by your health care provider. This may involve avoiding foods and drinks such as: ? Coffee and tea (with or without caffeine). ? Drinks that contain alcohol. ? Energy drinks and sports drinks. ? Carbonated drinks or sodas. ? Chocolate and cocoa. ? Peppermint and mint flavorings. ? Garlic and onions. ? Horseradish. ? Spicy and acidic foods, including peppers, chili powder, curry powder, vinegar, hot sauces, and barbecue sauce. ? Citrus fruit juices and citrus fruits, such as oranges, lemons, and limes. ? Tomato-based foods, such as red sauce, chili, salsa, and pizza with red sauce. ? Fried and fatty foods, such as donuts, french fries, potato chips, and high-fat dressings. ? High-fat meats, such as hot dogs and fatty cuts of red and white meats, such as rib eye steak, sausage, ham, and bacon. ? High-fat dairy items, such as whole milk, butter, and cream cheese.  Eat small, frequent meals instead of large meals.  Avoid drinking large amounts of liquid with your meals.  Avoid eating meals during the 2-3 hours before bedtime.  Avoid lying down right   after you eat.  Do not exercise right after you eat.  Avoid foods and drinks that seem to make your symptoms worse. General instructions  Pay attention to any changes in your symptoms.  Take over-the-counter and  prescription medicines only as told by your health care provider. Do not take aspirin, ibuprofen, or other NSAIDs unless your health care provider told you to do so.  If you have trouble taking pills, use a pill splitter to decrease the size of the pill. This will decrease the chance of the pill getting stuck or injuring your esophagus on the way down. Also, drink water after you take a pill.  Do not use any tobacco products, including cigarettes, chewing tobacco, and e-cigarettes. If you need help quitting, ask your health care provider.  Wear loose-fitting clothing. Do not wear anything tight around your waist that causes pressure on your abdomen.  Raise (elevate) the head of your bed about 6 inches (15 cm).  Try to reduce your stress, such as with yoga or meditation. If you need help reducing stress, ask your health care provider.  If you are overweight, reduce your weight to an amount that is healthy for you. Ask your health care provider for guidance about a safe weight loss goal.  Keep all follow-up visits as told by your health care provider. This is important. Contact a health care provider if:  You have new symptoms.  You have unexplained weight loss.  You have difficulty swallowing, or it hurts to swallow.  You have wheezing or a persistent cough.  Your symptoms do not improve with treatment.  You have frequent heartburn for more than two weeks. Get help right away if:  You have severe pain in your arms, neck, jaw, teeth, or back.  You feel sweaty, dizzy, or light-headed.  You have chest pain or shortness of breath.  You vomit and your vomit looks like blood or coffee grounds.  Your stool is bloody or black.  You have a fever.  You cannot swallow, drink, or eat. This information is not intended to replace advice given to you by your health care provider. Make sure you discuss any questions you have with your health care provider. Document Released: 12/24/2004  Document Revised: 04/23/2016 Document Reviewed: 03/13/2015 Elsevier Interactive Patient Education  2018 [employer]     

## 2017-12-24 NOTE — Progress Notes (Signed)
Subjective:  Seen on HD- GI sxms better- from what I understand from [facility] note- he is liekly ready to go - [facility]- have not heard back Objective Vital signs in last 24 hours: Vitals:   12/24/17 0727 12/24/17 0730 12/24/17 0800 12/24/17 0830  BP: (!) 161/85 (!) 161/85 108/61 (!) 104/57  Pulse: 92 96 91 93  Resp: 18 18 18 18   Temp:      TempSrc:      SpO2:      Weight:      Height:       Weight change: -0.515 kg (-2.2 oz)  Intake/Output Summary (Last 24 hours) at 12/24/2017 0902 Last data filed at 12/24/2017 7322 Gross per 24 hour  Intake 110 ml  Output 50 ml  Net 60 ml     Assessment/Plan: 62 year old black male with ESRD but managed through the [facility] in [place] attempted on PD but now wants to transition back to hemodialysis.  He is admitted for coffee-ground emesis 1.Renal-ESRD-wants to transition from PD back to hemo  receives that through the [facility] [place] in [place].  He has been drained and we did HD Wed via PC, another treatment today.  He reports attempted access in the past that has been unsuccessful.  Is not likely we will be able to get PD catheter removed this hospitalization.  I will defer to the [facility] [place].  We have contacted them to make sure that he has an outpatient hemodialysis spot when ready for discharge.  Hopefully will hear today 2. Hypertension/volume  -blood pressure is higher but low with HD here.  On amlodipine as outpatient and is ordered here- UF as able with HD 3. Anemia  -due to CKD but also evidence of recent GI bleed.  hgb pretty stable around 10 since 1/22.  Fecal occult positive.  GI has been consulted ?  Holding aspirin, on PPI-plan per them.  Have added ESA- iron  Sat 25- will add some iron as well  4.  Bones-we will continue outpatient PhosLo.  We will check phosphorus and intact PTH (784)- add calcitriol 0.5 TIW    Kemuel Buchmann A    Labs: Basic Metabolic Panel: Recent Labs  Lab 12/21/17 1744 12/22/17 0322 12/24/17 0756  NA 136 139  137  K 3.7 4.0 3.8  CL 91* 93* 95*  CO2 22 25 25   GLUCOSE 260* 210* 131*  BUN 69* 72* 57*  CREATININE 15.71* 15.17* 11.22*  CALCIUM 8.6* 8.1* 8.4*  PHOS  --   --  5.7*   Liver Function Tests: Recent Labs  Lab 12/21/17 1744 12/24/17 0756  AST 17  --   ALT 17  --   ALKPHOS 153*  --   BILITOT 0.7  --   PROT 8.9*  --   ALBUMIN 4.0 3.2*   Recent Labs  Lab 12/21/17 1744  LIPASE 36   No results for input(s): AMMONIA in the last 168 hours. CBC: Recent Labs  Lab 12/21/17 1744 12/21/17 2120 12/22/17 0322 12/23/17 0453 12/24/17 0756  WBC 11.0* 11.8* 12.5* 9.2 8.1  HGB 10.8* 9.7* 9.9* 10.1* 9.8*  HCT 33.5* 30.5* 30.3* 32.3* 30.6*  MCV 82.9 83.6 82.8 84.3 84.5  PLT 332 276 321 277 282   Cardiac Enzymes: Recent Labs  Lab 12/21/17 2120 12/22/17 0322 12/22/17 0910  TROPONINI <0.03 0.07* 0.06*   CBG: Recent Labs  Lab 12/22/17 0630 12/22/17 2350 12/23/17 0623 12/24/17 0605  GLUCAP 189* 125* 165* 138*    Iron Studies:  Recent Labs  12/22/17 1752  IRON 54  TIBC 214*  FERRITIN 479*   Studies/Results: Dg Chest 2 View  Result Date: 12/23/2017 CLINICAL DATA:  Cough, shortness of breath, and weakness. History of dialysis dependent renal failure, renal malignancy EXAM: CHEST  2 VIEW COMPARISON:  Chest x-ray of December 21, 2017 FINDINGS: The lungs are well-expanded. There is no focal infiltrate. There is stable scarring lateral to the left cardiac apex. There is no pleural effusion. The cardiac silhouette is top-normal in size. The pulmonary vascularity is normal. The dialysis catheter tip projects over the cavoatrial junction. There degenerative and/or post traumatic changes of the left humeral head and neck. IMPRESSION: There is no pneumonia, pulmonary edema, nor other acute cardiopulmonary abnormality. Electronically Signed   By: David  Martinique M.D.   On: 12/23/2017 13:03   Medications: Infusions: . ferric gluconate (FERRLECIT/NULECIT) IV Stopped (12/23/17 1120)     Scheduled Medications: . amLODipine  10 mg Oral BID  . calcium acetate  667 mg Oral TID WC  . cholecalciferol  2,000 Units Oral Daily  . darbepoetin (ARANESP) injection - DIALYSIS  100 mcg Intravenous Q Thu-HD  . gentamicin cream  1 application Topical Daily  . pantoprazole  40 mg Oral Daily    have reviewed scheduled and prn medications.  Physical Exam: General: NAD Heart: tachy Lungs: mostly clear Abdomen: obese, soft, non tender- PD cath in place  Extremities: minimal edema Dialysis Access: right IJ PC     12/24/2017,9:02 AM  LOS: 3 days

## 2017-12-24 NOTE — Progress Notes (Addendum)
PROGRESS NOTE    Allen Miller  KKX:[id number] DOB: [birth year] DOA: 12/21/2017 PCP: [facility]   Brief Narrative: Allen Miller is a 62 y.o. male with medical history significant of hypertension, stroke, kidney cancer, ESRD on peritoneal dialysis. He presented with coffee ground emesis. Hemoglobin stable. Nausea and vomiting persistent. Nephrology consulted for dialysis management.   Assessment & Plan:   Principal Problem:   Coffee ground emesis Active Problems:   History of CVA (cerebrovascular accident)   Microcytic anemia   ESRD on dialysis ([place])   Chest pain   Coffee ground emesis Resolved. Discussed with GI and since patient's hemoglobin is stable. No need for inpatient evaluation. -outpatient follow-up for colonoscopy  Nausea/vomiting No evidence of SBP on fluid analysis. Culture pending but negative to date. Improved. -Zofran -Continue phenergan prn refractory N/V  ESRD on PD Patient transitioned recently to PD. Transitioning back to HD. HD today -Nephrology recommendations  Anemia Likely secondary to chronic kidney disease. Possible GI component with negative FOBT and coffee ground emesis. Hemoglobin stable. Outpatient follow-up with GI  Chest pain Resolved. Troponin mildly elevated but flat in setting of kidney disease. EKG not suggestive of ACS.  Frothy sputum Per patient he is couching up sputum. No emesis. Diminished breath sounds without rales. Chest x-ray normal.   DVT prophylaxis: SCDs Code Status: Full code Family Communication: None at bedside Disposition Plan: Discharge pending improvement in nausea   Consultants:   Nephrology  Gastroenterology (telephone)  Procedures:   PD  HD  Antimicrobials:  None    Subjective: Slightly nauseated. No abdominal pain.  Objective: Vitals:   12/24/17 1030 12/24/17 1100 12/24/17 1130 12/24/17 1135  BP: (!) 109/55 (!) 108/55 97/63 (!) 114/57  Pulse: 86 91 90 90  Resp: 18 18 18  18   Temp:    97.8 F (36.6 C)  TempSrc:      SpO2:    99%  Weight:    79.6 kg (175 lb 7.8 oz)  Height:        Intake/Output Summary (Last 24 hours) at 12/24/2017 1303 Last data filed at 12/24/2017 1130 Gross per 24 hour  Intake 110 ml  Output 1757 ml  Net -1647 ml   Filed Weights   12/24/17 0556 12/24/17 0715 12/24/17 1135  Weight: 81.3 kg (179 lb 3.2 oz) 81.3 kg (179 lb 3.7 oz) 79.6 kg (175 lb 7.8 oz)    Examination:  General exam: Appears calm and comfortable Respiratory system: Respiratory effort normal. Gastrointestinal system: Abdomen is nondistended, soft and nontender. Normal bowel sounds heard. Central nervous system: Alert and oriented. No focal neurological deficits. Extremities: No edema. No calf tenderness Skin: No cyanosis. No rashes Psychiatry: Judgement and insight appear normal. Mood & affect appropriate.     Data Reviewed: I have personally reviewed following labs and imaging studies  CBC: Recent Labs  Lab 12/21/17 1744 12/21/17 2120 12/22/17 0322 12/23/17 0453 12/24/17 0756  WBC 11.0* 11.8* 12.5* 9.2 8.1  HGB 10.8* 9.7* 9.9* 10.1* 9.8*  HCT 33.5* 30.5* 30.3* 32.3* 30.6*  MCV 82.9 83.6 82.8 84.3 84.5  PLT 332 276 321 277 169   Basic Metabolic Panel: Recent Labs  Lab 12/21/17 1744 12/22/17 0322 12/24/17 0756  NA 136 139 137  K 3.7 4.0 3.8  CL 91* 93* 95*  CO2 22 25 25   GLUCOSE 260* 210* 131*  BUN 69* 72* 57*  CREATININE 15.71* 15.17* 11.22*  CALCIUM 8.6* 8.1* 8.4*  PHOS  --   --  5.7*  GFR: Estimated Creatinine Clearance: 6.7 mL/min (A) (by C-G formula based on SCr of 11.22 mg/dL (H)). Liver Function Tests: Recent Labs  Lab 12/21/17 1744 12/24/17 0756  AST 17  --   ALT 17  --   ALKPHOS 153*  --   BILITOT 0.7  --   PROT 8.9*  --   ALBUMIN 4.0 3.2*   Recent Labs  Lab 12/21/17 1744  LIPASE 36   No results for input(s): AMMONIA in the last 168 hours. Coagulation Profile: Recent Labs  Lab 12/21/17 2120  INR 1.13    Cardiac Enzymes: Recent Labs  Lab 12/21/17 2120 12/22/17 0322 12/22/17 0910  TROPONINI <0.03 0.07* 0.06*   BNP (last 3 results) No results for input(s): PROBNP in the last 8760 hours. HbA1C: Recent Labs    12/22/17 0322  HGBA1C 6.7*   CBG: Recent Labs  Lab 12/22/17 0630 12/22/17 2350 12/23/17 0623 12/24/17 0605  GLUCAP 189* 125* 165* 138*   Lipid Profile: Recent Labs    12/22/17 0322  CHOL 130  HDL 44  LDLCALC 72  TRIG 70  CHOLHDL 3.0   Thyroid Function Tests: No results for input(s): TSH, T4TOTAL, FREET4, T3FREE, THYROIDAB in the last 72 hours. Anemia Panel: Recent Labs    12/22/17 1752  FERRITIN 479*  TIBC 214*  IRON 54   Sepsis Labs: No results for input(s): PROCALCITON, LATICACIDVEN in the last 168 hours.  Recent Results (from the past 240 hour(s))  Body fluid culture     Status: None (Preliminary result)   Collection Time: 12/21/17  8:12 PM  Result Value Ref Range Status   Specimen Description FLUID PERITONEAL  Final   Special Requests NONE  Final   Gram Stain RARE WBC PRESENT, PREDOMINANTLY MONONUCLEAR   Final   Culture NO GROWTH 2 DAYS  Final   Report Status PENDING  Incomplete  Culture, blood (Routine X 2) w Reflex to ID Panel     Status: None (Preliminary result)   Collection Time: 12/21/17  9:35 PM  Result Value Ref Range Status   Specimen Description BLOOD LEFT HAND  Final   Special Requests   Final    BOTTLES DRAWN AEROBIC AND ANAEROBIC Blood Culture adequate volume   Culture NO GROWTH 2 DAYS  Final   Report Status PENDING  Incomplete  Culture, blood (Routine X 2) w Reflex to ID Panel     Status: None (Preliminary result)   Collection Time: 12/21/17  9:41 PM  Result Value Ref Range Status   Specimen Description BLOOD LEFT HAND  Final   Special Requests IN PEDIATRIC BOTTLE Blood Culture adequate volume  Final   Culture NO GROWTH 2 DAYS  Final   Report Status PENDING  Incomplete         Radiology Studies: Dg Chest 2  View  Result Date: 12/23/2017 CLINICAL DATA:  Cough, shortness of breath, and weakness. History of dialysis dependent renal failure, renal malignancy EXAM: CHEST  2 VIEW COMPARISON:  Chest x-ray of December 21, 2017 FINDINGS: The lungs are well-expanded. There is no focal infiltrate. There is stable scarring lateral to the left cardiac apex. There is no pleural effusion. The cardiac silhouette is top-normal in size. The pulmonary vascularity is normal. The dialysis catheter tip projects over the cavoatrial junction. There degenerative and/or post traumatic changes of the left humeral head and neck. IMPRESSION: There is no pneumonia, pulmonary edema, nor other acute cardiopulmonary abnormality. Electronically Signed   By: David  Martinique M.D.   On: 12/23/2017  13:03        Scheduled Meds: . amLODipine  10 mg Oral BID  . calcitRIOL  0.5 mcg Oral Q M,W,F-HD  . calcium acetate  667 mg Oral TID WC  . darbepoetin (ARANESP) injection - DIALYSIS  100 mcg Intravenous Q Fri-HD  . gentamicin cream  1 application Topical Daily  . pantoprazole  40 mg Oral Daily   Continuous Infusions: . ferric gluconate (FERRLECIT/NULECIT) IV 125 mg (12/24/17 0954)     LOS: 3 days     Cordelia Poche, MD [facility] 12/24/2017, 1:03 PM Pager: [phone]  If 7PM-7AM, please contact night-coverage [email or web address] Password [email or web address] 12/24/2017, 1:03 PM

## 2017-12-24 NOTE — Procedures (Signed)
Patient was seen on dialysis and the procedure was supervised.  BFR 400  Via PC BP is  104/57.   Patient appears to be tolerating treatment well  Allen Miller A 12/24/2017

## 2017-12-24 NOTE — Progress Notes (Signed)
Received call from the [facility] requesting clinical information for HD; clinical information faxed to [phone] as requested; Aneta Mins [place] [phone]

## 2017-12-24 NOTE — Progress Notes (Signed)
Accepted at [facility] Hemodialysis can start Monday January 28,2019 at 12:30pm .Schedule Monday,Wednesday,Friday at 12:45

## 2017-12-24 NOTE — Progress Notes (Signed)
Post trmt report called to Faylene Million, RN.  Patient tolerated treatment fairly well.  UF goal ordered 2-3 kg.  D/W Dr. Moshe Cipro will attempt 2 kg off.  After appos 1 1/2 hrs trmt, BP average 100-110 / 60's.  Received aranesp and nulicit infustion.  Received outpt HD placement.  Patient going to [facility] center.  MWF 12:45 chair.  Pt to arrive by 12:30 on Monday 12/27/17 for first trmt.

## 2017-12-24 NOTE — Discharge Summary (Addendum)
Physician Discharge Summary  Allen Miller SVX:[id number] DOB: Sep 13, 1956 DOA: 12/21/2017  PCP: [facility]  Admit date: 12/21/2017 Discharge date: 12/26/2017  Admitted From: Home Disposition: Home  Recommendations for Outpatient Follow-up:  1. Follow up with PCP in 1 week 2. Follow-up with GI in 1-2 weeks 3. Esophageal biopsy pending 4. Please obtain BMP/CBC in one week 5. PD catheter needs to be removed 6. Please follow up on the following pending results: Biopsy  Home Health: None Equipment/Devices: None  Discharge Condition: Stable CODE STATUS: Full code Diet recommendation: Heart healthy/renal   Brief/Interim Summary:  Admission HPI written by Ivor Costa, MD   Chief Complaint: Coffee-ground emesis  HPI: Allen Miller is a 62 y.o. male with medical history significant of hypertension, stroke, kidney cancer, ESRD on peritoneal dialysis, who presents with coffee ground emesis.  Patient states that he has been having nausea and vomiting in the past 2 days. He has vomited more than 10 times of coffee ground materials. No fever, but has chills. Denies abdominal pain or diarrhea. Pt states that he never had EGD or colonoscopy before. Patient is taking 81 mg aspirin daily for hx of stroke. Patient states that he does not have chest pain at rest, but severe nausea and vomiting cause some mild chest pain, which is located in the central chest. Denies cough or SOB. Patient denies symptoms of UTI.  ED Course: pt was found to have WBC 11.0, negative troponin, negative FOBT, hgb stable (10.9 on 06/25/17-->10.89 today), potassium 3.7, bicarbonate 22, creatinine 15.71, BUN 69, temperature normal, elevated blood pressure 180/104, heart rate in 90s, oxygen saturation 99% on room air, negative chest x-ray. Patient is admitted to telemetry bed as inpatient. Nephrology, Dr. Florene Glen was consulted.    Hospital course:  Esophagitis GERD Patient with recurrent vomiting and possible  coffee ground emesis. Hemoglobin remained stable. FOBT was negative. EGD performed and was significant for esophagitis. biopsy obtained. Protonix twice daily in addition to Zofran on discharge. Patient tolerated diet and is requesting discharge. Okay for discharge and patient to advance as tolerated.  Nausea/vomiting No evidence of SBP on fluid analysis. Culture negative. Blood cultures negative. No small bowel obstruction or ileus on abdominal x-ray. GI consulted and EGD significant for esophagitis. Patient feels that amlodipine was causing his symptoms. This was refused by the patient and have recommended to discontinue on discharge. Zofran as needed on discharge.  ESRD on PD Patient transitioned recently to PD. Transitioned back to HD. HD set up as an outpatient for MWF.  Anemia Likely secondary to chronic kidney disease. Possible GI component with negative FOBT and coffee ground emesis. Hemoglobin stable.  Chest pain Resolved. Troponin mildly elevated but flat in setting of kidney disease. EKG not suggestive of ACS. Likely related to GERD/esophagitis.  Frothy sputum Per patient he is coughing up sputum. No emesis. Diminished breath sounds without rales. Chest x-ray normal.  Diabetes mellitus Hemoglobin 6.7%. Recommend diet control.    Discharge Diagnoses:  Principal Problem:   GERD with esophagitis Active Problems:   History of CVA (cerebrovascular accident)   Microcytic anemia   ESRD on dialysis ([place])   Coffee ground emesis   Chest pain   Dysphagia   Non-intractable vomiting with nausea    Discharge Instructions  Discharge Instructions    Call MD for:  persistant nausea and vomiting   Complete by:  As directed    Diet - low sodium heart healthy   Complete by:  As directed    Increase activity  slowly   Complete by:  As directed    Increase activity slowly   Complete by:  As directed      Allergies as of 12/26/2017      Reactions   Pork-derived Products Other  (See Comments)   NOT AN ALLERGY > RELIGIOUS REASONS   Ppd [tuberculin Purified Protein Derivative] Rash      Medication List    STOP taking these medications   amLODipine 10 MG tablet Commonly known as:  NORVASC     TAKE these medications   acetaminophen 325 MG tablet Commonly known as:  TYLENOL Take 2 tablets (650 mg total) by mouth every 6 (six) hours as needed for mild pain, fever or headache. What changed:    medication strength  how much to take  reasons to take this   aspirin 81 MG chewable tablet Chew 81 mg by mouth daily.   calcitRIOL 0.5 MCG capsule Commonly known as:  ROCALTROL Take 1 capsule (0.5 mcg total) by mouth every Monday, Wednesday, and Friday with hemodialysis.   calcium acetate 667 MG capsule Commonly known as:  PHOSLO Take 667 mg by mouth 3 (three) times daily.   ondansetron 4 MG disintegrating tablet Commonly known as:  ZOFRAN ODT Take 1 tablet (4 mg total) by mouth every 8 (eight) hours as needed for nausea or vomiting.   pantoprazole 40 MG tablet Commonly known as:  PROTONIX Take 1 tablet (40 mg total) by mouth 2 (two) times daily.   Vitamin D3 1000 units Caps Take 2,000 Units by mouth daily.      Follow-up Information    [facility] Schedule an appointment as soon as possible for a visit in 1 week(s).   Why:  Repeat labs Contact information: [place] [address] [phone]        Juanita Craver, MD. Schedule an appointment as soon as possible for a visit in 2 week(s).   Specialty:  Gastroenterology Why:  Biopsy results from EGD Contact information: [address] [place] [address] [phone]          Allergies  Allergen Reactions  . Pork-Derived Products Other (See Comments)    NOT AN ALLERGY > RELIGIOUS REASONS  . Ppd [Tuberculin Purified Protein Derivative] Rash    Consultations:  Gastroenterology  Nephrology   Procedures/Studies: Dg  Chest 2 View  Result Date: 12/23/2017 CLINICAL DATA:  Cough, shortness of breath, and weakness. History of dialysis dependent renal failure, renal malignancy EXAM: CHEST  2 VIEW COMPARISON:  Chest x-ray of December 21, 2017 FINDINGS: The lungs are well-expanded. There is no focal infiltrate. There is stable scarring lateral to the left cardiac apex. There is no pleural effusion. The cardiac silhouette is top-normal in size. The pulmonary vascularity is normal. The dialysis catheter tip projects over the cavoatrial junction. There degenerative and/or post traumatic changes of the left humeral head and neck. IMPRESSION: There is no pneumonia, pulmonary edema, nor other acute cardiopulmonary abnormality. Electronically Signed   By: David  Martinique M.D.   On: 12/23/2017 13:03   Dg Chest 2 View  Result Date: 12/21/2017 CLINICAL DATA:  Right-sided chest pain, nausea and vomiting with dizziness. End-stage renal disease. EXAM: CHEST  2 VIEW COMPARISON:  10/27/2016 FINDINGS: The heart size and mediastinal contours are within normal limits. Tip of right-sided dialysis catheter is seen in the mid right atrium. Both lungs are clear. The visualized skeletal structures are unremarkable. IMPRESSION: No active cardiopulmonary disease. Tip of right-sided  dialysis catheter is seen in the mid right atrium. Electronically Signed   By: Ashley Royalty M.D.   On: 12/21/2017 18:35   Dg Abd 1 View  Result Date: 12/25/2017 CLINICAL DATA:  Dark emesis.  Constipation. EXAM: ABDOMEN - 1 VIEW COMPARISON:  None. FINDINGS: The abdomen is gasless. No abnormal abdominal calcification is seen. Peritoneal dialysis catheter is noted. No abnormal abdominal calcification or focal bony abnormality. IMPRESSION: No acute finding.  The abdomen is gasless. Electronically Signed   By: Inge Rise M.D.   On: 12/25/2017 13:05    EGD (12/26/2017) Impression:  - LA Grade C reflux esophagitis. Biopsied.                           - Normal stomach.                            - Normal duodenal bulb and second portion of the duodenum.  Recommendation:             - Return patient to hospital ward for ongoing care.                           - Full liquid diet today.                           - Continue present medications including PPI IV bid for now.                           - Await pathology results.                           - Drs. Collene Mares and Benson Norway to assume care on Monday.     Subjective: Nausea resolved.   Discharge Exam: Vitals:   12/26/17 1045 12/26/17 1218  BP: 120/70 (!) 161/85  Pulse: 81 84  Resp:  20  Temp:  (!) 97.5 F (36.4 C)  SpO2:  100%   Vitals:   12/26/17 1020 12/26/17 1025 12/26/17 1045 12/26/17 1218  BP: (!) 105/41 108/60 120/70 (!) 161/85  Pulse: 76 78 81 84  Resp: 13 16  20   Temp: 98.5 F (36.9 C)   (!) 97.5 F (36.4 C)  TempSrc: Oral   Oral  SpO2: 100% 99%  100%  Weight:      Height:        General: Pt is alert, awake, not in acute distress Cardiovascular: RRR, S1/S2 +, no rubs, no gallops Respiratory: CTA bilaterally, no wheezing, no rhonchi Abdominal: Soft, NT, ND, bowel sounds + Extremities: no edema, no cyanosis    The results of significant diagnostics from this hospitalization (including imaging, microbiology, ancillary and laboratory) are listed below for reference.     Microbiology: Recent Results (from the past 240 hour(s))  Body fluid culture     Status: None   Collection Time: 12/21/17  8:12 PM  Result Value Ref Range Status   Specimen Description FLUID PERITONEAL  Final   Special Requests NONE  Final   Gram Stain   Final    RARE WBC PRESENT, PREDOMINANTLY MONONUCLEAR NO ORGANISMS SEEN    Culture No growth aerobically or anaerobically.  Final   Report Status 12/25/2017 FINAL  Final  Culture, blood (Routine X 2) w  Reflex to ID Panel     Status: None   Collection Time: 12/21/17  9:35 PM  Result Value Ref Range Status   Specimen Description BLOOD LEFT HAND  Final   Special  Requests   Final    BOTTLES DRAWN AEROBIC AND ANAEROBIC Blood Culture adequate volume   Culture NO GROWTH 5 DAYS  Final   Report Status 12/26/2017 FINAL  Final  Culture, blood (Routine X 2) w Reflex to ID Panel     Status: None   Collection Time: 12/21/17  9:41 PM  Result Value Ref Range Status   Specimen Description BLOOD LEFT HAND  Final   Special Requests IN PEDIATRIC BOTTLE Blood Culture adequate volume  Final   Culture NO GROWTH 5 DAYS  Final   Report Status 12/26/2017 FINAL  Final     Labs: BNP (last 3 results) No results for input(s): BNP in the last 8760 hours. Basic Metabolic Panel: Recent Labs  Lab 12/21/17 1744 12/22/17 0322 12/24/17 0756  NA 136 139 137  K 3.7 4.0 3.8  CL 91* 93* 95*  CO2 22 25 25   GLUCOSE 260* 210* 131*  BUN 69* 72* 57*  CREATININE 15.71* 15.17* 11.22*  CALCIUM 8.6* 8.1* 8.4*  PHOS  --   --  5.7*   Liver Function Tests: Recent Labs  Lab 12/21/17 1744 12/24/17 0756  AST 17  --   ALT 17  --   ALKPHOS 153*  --   BILITOT 0.7  --   PROT 8.9*  --   ALBUMIN 4.0 3.2*   Recent Labs  Lab 12/21/17 1744  LIPASE 36   No results for input(s): AMMONIA in the last 168 hours. CBC: Recent Labs  Lab 12/21/17 1744 12/21/17 2120 12/22/17 0322 12/23/17 0453 12/24/17 0756  WBC 11.0* 11.8* 12.5* 9.2 8.1  HGB 10.8* 9.7* 9.9* 10.1* 9.8*  HCT 33.5* 30.5* 30.3* 32.3* 30.6*  MCV 82.9 83.6 82.8 84.3 84.5  PLT 332 276 321 277 282   Cardiac Enzymes: Recent Labs  Lab 12/21/17 2120 12/22/17 0322 12/22/17 0910  TROPONINI <0.03 0.07* 0.06*   BNP: Invalid input(s): POCBNP CBG: Recent Labs  Lab 12/22/17 2350 12/23/17 0623 12/24/17 0605 12/25/17 0714 12/26/17 0721  GLUCAP 125* 165* 138* 133* 146*   D-Dimer No results for input(s): DDIMER in the last 72 hours. Hgb A1c No results for input(s): HGBA1C in the last 72 hours. Lipid Profile No results for input(s): CHOL, HDL, LDLCALC, TRIG, CHOLHDL, LDLDIRECT in the last 72 hours. Thyroid  function studies No results for input(s): TSH, T4TOTAL, T3FREE, THYROIDAB in the last 72 hours.  Invalid input(s): FREET3 Anemia work up No results for input(s): VITAMINB12, FOLATE, FERRITIN, TIBC, IRON, RETICCTPCT in the last 72 hours. Urinalysis    Component Value Date/Time   COLORURINE YELLOW 01/22/2016 0808   APPEARANCEUR CLEAR 01/22/2016 0808   LABSPEC 1.005 01/22/2016 0808   PHURINE 7.5 01/22/2016 0808   GLUCOSEU NEGATIVE 01/22/2016 0808   HGBUR SMALL (A) 01/22/2016 0808   BILIRUBINUR NEGATIVE 01/22/2016 0808   KETONESUR NEGATIVE 01/22/2016 0808   PROTEINUR >300 (A) 01/22/2016 0808   NITRITE NEGATIVE 01/22/2016 0808   LEUKOCYTESUR NEGATIVE 01/22/2016 0808   Sepsis Labs Invalid input(s): PROCALCITONIN,  WBC,  LACTICIDVEN Microbiology Recent Results (from the past 240 hour(s))  Body fluid culture     Status: None   Collection Time: 12/21/17  8:12 PM  Result Value Ref Range Status   Specimen Description FLUID PERITONEAL  Final   Special Requests NONE  Final   Gram Stain   Final    RARE WBC PRESENT, PREDOMINANTLY MONONUCLEAR NO ORGANISMS SEEN    Culture No growth aerobically or anaerobically.  Final   Report Status 12/25/2017 FINAL  Final  Culture, blood (Routine X 2) w Reflex to ID Panel     Status: None   Collection Time: 12/21/17  9:35 PM  Result Value Ref Range Status   Specimen Description BLOOD LEFT HAND  Final   Special Requests   Final    BOTTLES DRAWN AEROBIC AND ANAEROBIC Blood Culture adequate volume   Culture NO GROWTH 5 DAYS  Final   Report Status 12/26/2017 FINAL  Final  Culture, blood (Routine X 2) w Reflex to ID Panel     Status: None   Collection Time: 12/21/17  9:41 PM  Result Value Ref Range Status   Specimen Description BLOOD LEFT HAND  Final   Special Requests IN PEDIATRIC BOTTLE Blood Culture adequate volume  Final   Culture NO GROWTH 5 DAYS  Final   Report Status 12/26/2017 FINAL  Final     SIGNED:   Cordelia Poche, MD [facility] 12/26/2017, 1:53 PM Pager [phone]  If 7PM-7AM, please contact night-coverage [email or web address] Password [email or web address]

## 2017-12-24 NOTE — Plan of Care (Signed)
  Progressing Safety: Ability to remain free from injury will improve 12/24/2017 0606 - Progressing by Ardine Eng, RN

## 2017-12-25 ENCOUNTER — Inpatient Hospital Stay (HOSPITAL_COMMUNITY): Payer: BLUE CROSS/BLUE SHIELD

## 2017-12-25 DIAGNOSIS — R112 Nausea with vomiting, unspecified: Secondary | ICD-10-CM

## 2017-12-25 DIAGNOSIS — R1310 Dysphagia, unspecified: Secondary | ICD-10-CM

## 2017-12-25 LAB — BODY FLUID CULTURE: CULTURE: NO GROWTH

## 2017-12-25 LAB — GLUCOSE, CAPILLARY: Glucose-Capillary: 133 mg/dL — ABNORMAL HIGH (ref 65–99)

## 2017-12-25 MED ORDER — ONDANSETRON 4 MG PO TBDP: 4.0000 mg | ORAL_TABLET | Freq: Three times a day (TID) | ORAL | Status: DC | PRN

## 2017-12-25 MED ORDER — ONDANSETRON HCL 4 MG/2ML IJ SOLN
4.0000 mg | Freq: Three times a day (TID) | INTRAMUSCULAR | Status: DC | PRN
  Administered 2017-12-25: 4 mg via INTRAVENOUS
  Filled 2017-12-25: qty 2

## 2017-12-25 MED ORDER — AMLODIPINE BESYLATE 5 MG PO TABS
5.0000 mg | ORAL_TABLET | Freq: Two times a day (BID) | ORAL | Status: DC
  Filled 2017-12-25: qty 1

## 2017-12-25 MED ORDER — PROMETHAZINE HCL 25 MG PO TABS
25.0000 mg | ORAL_TABLET | Freq: Four times a day (QID) | ORAL | Status: DC | PRN
  Administered 2017-12-25: 25 mg via ORAL
  Filled 2017-12-25: qty 1

## 2017-12-25 MED ORDER — PROMETHAZINE HCL 25 MG/ML IJ SOLN: 25.0000 mg | Freq: Four times a day (QID) | INTRAMUSCULAR | Status: DC | PRN

## 2017-12-25 MED ORDER — PANTOPRAZOLE SODIUM 40 MG IV SOLR
40.0000 mg | INTRAVENOUS | Status: DC
  Administered 2017-12-25: 40 mg via INTRAVENOUS
  Filled 2017-12-25: qty 40

## 2017-12-25 MED ORDER — ONDANSETRON HCL 4 MG/2ML IJ SOLN: 4.0000 mg | Freq: Three times a day (TID) | INTRAMUSCULAR | Status: DC | PRN

## 2017-12-25 NOTE — Progress Notes (Addendum)
PROGRESS NOTE    Allen Miller  TSV:[id number] DOB: [birth year] DOA: 12/21/2017 PCP: [facility]   Brief Narrative: Allen Miller is a 62 y.o. male with medical history significant of hypertension, stroke, kidney cancer, ESRD on peritoneal dialysis. He presented with coffee ground emesis. Hemoglobin stable. Nausea and vomiting persistent. Nephrology consulted for dialysis management.   Assessment & Plan:   Principal Problem:   Coffee ground emesis Active Problems:   History of CVA (cerebrovascular accident)   Microcytic anemia   ESRD on dialysis ([place])   Chest pain   Coffee ground emesis Patient with another ?episode overnight. Hemoglobin has remained stable. On admission, FOBT negative.  -outpatient follow-up for colonoscopy/EGD  Nausea/vomiting No evidence of SBP on fluid analysis. Culture pending but negative to date. Improved. Passing gas but no bowel movement. No abdominal tenderness. Dark emesis overnight -Zofran -Continue phenergan prn refractory N/V -abdominal x-ray to rule out ileus/SBO. If negative, will consult GI again  ESRD on PD Patient transitioned recently to PD. Transitioned back to HD. HD today -Nephrology recommendations  Anemia Likely secondary to chronic kidney disease. Possible GI component with positive FOBT and coffee ground emesis. Hemoglobin stable. Outpatient follow-up with GI  Chest pain Resolved. Troponin mildly elevated but flat in setting of kidney disease. EKG not suggestive of ACS.  Frothy sputum Per patient he is couching up sputum. No emesis. Diminished breath sounds without rales. Chest x-ray normal.   DVT prophylaxis: SCDs Code Status: Full code Family Communication: None at bedside Disposition Plan: Discharge pending improvement in nausea   Consultants:   Nephrology  Gastroenterology (telephone)  Procedures:   PD  HD  Antimicrobials:  None    Subjective: Episode of emesis overnight. Dark colored.  Described as coffee ground by patient. No bowel movement but passing some gas.  Objective: Vitals:   12/24/17 1725 12/24/17 2040 12/25/17 0500 12/25/17 0701  BP:  (!) 144/73  125/62  Pulse:  97  97  Resp:  18  18  Temp: 99.5 F (37.5 C) 99.1 F (37.3 C)  98.6 F (37 C)  TempSrc: Oral Oral  Oral  SpO2:  100%  100%  Weight:   77.6 kg (171 lb)   Height:        Intake/Output Summary (Last 24 hours) at 12/25/2017 1050 Last data filed at 12/25/2017 0931 Gross per 24 hour  Intake 360 ml  Output 2107 ml  Net -1747 ml   Filed Weights   12/24/17 0715 12/24/17 1135 12/25/17 0500  Weight: 81.3 kg (179 lb 3.7 oz) 79.6 kg (175 lb 7.8 oz) 77.6 kg (171 lb)    Examination:  General exam: Appears calm and comfortable Respiratory system: Respiratory effort normal. Cardiac: Regular rate and rhythm. Normal S1 and S2. No heart murmurs present. No extra heart sounds Gastrointestinal system: Abdomen is nondistended, soft and nontender. Minimal bowel sounds heard. Central nervous system: Alert and oriented. No focal neurological deficits. Extremities: No edema. No calf tenderness Skin: No cyanosis. No rashes Psychiatry: Judgement and insight appear normal. Mood & affect appropriate.     Data Reviewed: I have personally reviewed following labs and imaging studies  CBC: Recent Labs  Lab 12/21/17 1744 12/21/17 2120 12/22/17 0322 12/23/17 0453 12/24/17 0756  WBC 11.0* 11.8* 12.5* 9.2 8.1  HGB 10.8* 9.7* 9.9* 10.1* 9.8*  HCT 33.5* 30.5* 30.3* 32.3* 30.6*  MCV 82.9 83.6 82.8 84.3 84.5  PLT 332 276 321 277 923   Basic Metabolic Panel: Recent Labs  Lab 12/21/17 1744 12/22/17  0322 12/24/17 0756  NA 136 139 137  K 3.7 4.0 3.8  CL 91* 93* 95*  CO2 22 25 25   GLUCOSE 260* 210* 131*  BUN 69* 72* 57*  CREATININE 15.71* 15.17* 11.22*  CALCIUM 8.6* 8.1* 8.4*  PHOS  --   --  5.7*   GFR: Estimated Creatinine Clearance: 6.6 mL/min (A) (by C-G formula based on SCr of 11.22 mg/dL  (H)). Liver Function Tests: Recent Labs  Lab 12/21/17 1744 12/24/17 0756  AST 17  --   ALT 17  --   ALKPHOS 153*  --   BILITOT 0.7  --   PROT 8.9*  --   ALBUMIN 4.0 3.2*   Recent Labs  Lab 12/21/17 1744  LIPASE 36   No results for input(s): AMMONIA in the last 168 hours. Coagulation Profile: Recent Labs  Lab 12/21/17 2120  INR 1.13   Cardiac Enzymes: Recent Labs  Lab 12/21/17 2120 12/22/17 0322 12/22/17 0910  TROPONINI <0.03 0.07* 0.06*   BNP (last 3 results) No results for input(s): PROBNP in the last 8760 hours. HbA1C: No results for input(s): HGBA1C in the last 72 hours. CBG: Recent Labs  Lab 12/22/17 0630 12/22/17 2350 12/23/17 0623 12/24/17 0605 12/25/17 0714  GLUCAP 189* 125* 165* 138* 133*   Lipid Profile: No results for input(s): CHOL, HDL, LDLCALC, TRIG, CHOLHDL, LDLDIRECT in the last 72 hours. Thyroid Function Tests: No results for input(s): TSH, T4TOTAL, FREET4, T3FREE, THYROIDAB in the last 72 hours. Anemia Panel: Recent Labs    12/22/17 1752  FERRITIN 479*  TIBC 214*  IRON 54   Sepsis Labs: No results for input(s): PROCALCITON, LATICACIDVEN in the last 168 hours.  Recent Results (from the past 240 hour(s))  Body fluid culture     Status: None (Preliminary result)   Collection Time: 12/21/17  8:12 PM  Result Value Ref Range Status   Specimen Description FLUID PERITONEAL  Final   Special Requests NONE  Final   Gram Stain RARE WBC PRESENT, PREDOMINANTLY MONONUCLEAR   Final   Culture No growth aerobically or anaerobically.  Final   Report Status PENDING  Incomplete  Culture, blood (Routine X 2) w Reflex to ID Panel     Status: None (Preliminary result)   Collection Time: 12/21/17  9:35 PM  Result Value Ref Range Status   Specimen Description BLOOD LEFT HAND  Final   Special Requests   Final    BOTTLES DRAWN AEROBIC AND ANAEROBIC Blood Culture adequate volume   Culture NO GROWTH 3 DAYS  Final   Report Status PENDING  Incomplete   Culture, blood (Routine X 2) w Reflex to ID Panel     Status: None (Preliminary result)   Collection Time: 12/21/17  9:41 PM  Result Value Ref Range Status   Specimen Description BLOOD LEFT HAND  Final   Special Requests IN PEDIATRIC BOTTLE Blood Culture adequate volume  Final   Culture NO GROWTH 3 DAYS  Final   Report Status PENDING  Incomplete         Radiology Studies: Dg Chest 2 View  Result Date: 12/23/2017 CLINICAL DATA:  Cough, shortness of breath, and weakness. History of dialysis dependent renal failure, renal malignancy EXAM: CHEST  2 VIEW COMPARISON:  Chest x-ray of December 21, 2017 FINDINGS: The lungs are well-expanded. There is no focal infiltrate. There is stable scarring lateral to the left cardiac apex. There is no pleural effusion. The cardiac silhouette is top-normal in size. The pulmonary vascularity is normal.  The dialysis catheter tip projects over the cavoatrial junction. There degenerative and/or post traumatic changes of the left humeral head and neck. IMPRESSION: There is no pneumonia, pulmonary edema, nor other acute cardiopulmonary abnormality. Electronically Signed   By: David  Martinique M.D.   On: 12/23/2017 13:03        Scheduled Meds: . amLODipine  5 mg Oral BID  . calcitRIOL  0.5 mcg Oral Q M,W,F-HD  . calcium acetate  667 mg Oral TID WC  . darbepoetin (ARANESP) injection - DIALYSIS  100 mcg Intravenous Q Fri-HD  . gentamicin cream  1 application Topical Daily  . pantoprazole  40 mg Oral Daily   Continuous Infusions: . ferric gluconate (FERRLECIT/NULECIT) IV 125 mg (12/24/17 0954)     LOS: 4 days     Cordelia Poche, MD [facility] 12/25/2017, 10:50 AM Pager: [phone]  If 7PM-7AM, please contact night-coverage [email or web address] Password [email or web address] 12/25/2017, 10:50 AM

## 2017-12-25 NOTE — H&P (View-Only) (Signed)
Gastroenterology Consultation                                       (covering for Drs. Adriana Mccallum)   Referring Provider: [facility]   Primary Care Physician:  [facility] Primary Gastroenterologist:   Althia Forts - covering for Drs.Selinda Michaels  Reason for Consultation:  hematemesis  ASSESSMENT AND PLAN:    2. 62 yo male admitted with N/V/hematemesis (apparently low volume based on hgb which remains around baseline). Takes baby asa at home. Denies other NSAIDs. Of note, heme negative on admission.  No associated abdominal pain.  No melena. Other than mildly elevated alkaline phosphatase his LFTs are unremarkable.  Lipase normal.  Correlates onset of nausea and vomiting with recent addition of a new BP med but Nephrology feels this is unlikely.  -Given vomiting will change PO PPI to IV form but doesn't need continuous gtt..  -Given persistence of his symptoms and absence of other findings it is reasonable to schedule him for an EGD. The risks and benefits of EGD were discussed and the patient agrees to proceed.   2. ESRD, on PD  3 Solid food dysphagia, acute.  Started this admission.  He did not offer this complaint but mentioned it after I asked him about it.  No obvious oral Candida on exam.   -Further evaluation at time of EGD    Attending physician's note   I have taken a history, examined the patient and reviewed the chart. I agree with the Advanced Practitioner's note, impression and recommendations. Persistent nausea, vomiting, coffee ground emesis with a stable Hb. Heme negative stool on admission. He complaints of dysphagia as well. R/O candida, esophagitis, stricture, gastritis, ulcer, etc. IV PPI bid for now. EGD tomorrow morning.   Lucio Edward, MD [facility] [phone] Mon-Fri 8a-5p [phone] after 5p, weekends, holidays    HPI: Allen Miller is a 62 y.o. male with hypertension, history of renal cancer, and end-stage renal  disease on peritoneal dialysis.  Patient was admitted 12/21/2017 for hematemesis.  Hgb stable at the time at 10.8. Admitting team apparently spoke to unassigned GI group 2 days ago and it was decided that no inpatient workup would be needed but the patient would need outpatient colonoscopy/EGD since he had never had either one.  His nausea and vomiting has persisted, he apparently had dark emesis overnight, GI has been asked to consult. Hgb still at baseline,it is 9.8 this am. ASA being held.  No evidence for infected peritoneal fluid  Patient endorses some acute solid food dysphagia which started this admission.  He has no odynophagia.   Past Medical History:  Diagnosis Date  . Cancer [facility])    kidney cancer  . Coffee ground emesis 11/2017  . ESRD (end stage renal disease) ([place])    MWF  . Hemodialysis patient ([place])   . Hypertension   . Low iron   . Shortness of breath dyspnea    when lying flat  . Stroke [facility])    effective vision- Left eye    Past Surgical History:  Procedure Laterality Date  . A/V FISTULAGRAM Right 06/25/2017   Procedure: A/V Fistulagram;  Surgeon: Elam Dutch, MD;  Location: [place] CV LAB;  Service: Cardiovascular;  Laterality: Right;  . AV FISTULA PLACEMENT Left 10/27/2016   Procedure: INSERTION LEFT UPPER ARM  OF ARTERIOVENOUS (AV) GORE-TEX GRAFT ARM;  Surgeon: Aaron Edelman  Starlyn Skeans, MD;  Location: [place];  Service: Vascular;  Laterality: Left;  . AV FISTULA PLACEMENT Right 03/23/2017   Procedure: ARTERIOVENOUS (AV) FISTULA CREATION;  Surgeon: Elam Dutch, MD;  Location: [place];  Service: Vascular;  Laterality: Right;  . BASCILIC VEIN TRANSPOSITION Left 02/24/2016   Procedure: BASILIC VEIN TRANSPOSITION VERSUS ARTERIOVENOUS GRAFT INSERTION;  Surgeon: [name], MD;  Location: [place];  Service: Vascular;  Laterality: Left;  . BASCILIC VEIN TRANSPOSITION Left 07/14/2016   Procedure: LEFT BASILIC VEIN TRANSPOSITION;  Surgeon: Conrad Hawthorne, MD;  Location: [place];   Service: Vascular;  Laterality: Left;  . BASCILIC VEIN TRANSPOSITION Left 09/08/2016   Procedure: SECOND STAGE BASILIC VEIN TRANSPOSITION LEFT UPPER ARM;  Surgeon: Conrad Ewing, MD;  Location: [place];  Service: Vascular;  Laterality: Left;  . INSERTION OF DIALYSIS CATHETER N/A 02/24/2016   Procedure: INSERTION OF DIALYSIS CATHETER;  Surgeon: [name], MD;  Location: [place];  Service: Vascular;  Laterality: N/A;  . INSERTION OF DIALYSIS CATHETER Right 10/27/2016   Procedure: EXCHANGE OF RIGHT INTERNAL JUGULAR DIALYSIS CATHETER;  Surgeon: Conrad H[place] Hills, MD;  Location: [place];  Service: Vascular;  Laterality: Right;  . PERIPHERAL VASCULAR BALLOON ANGIOPLASTY Right 06/25/2017   Procedure: Peripheral Vascular Balloon Angioplasty;  Surgeon: Elam Dutch, MD;  Location: [place] CV LAB;  Service: Cardiovascular;  Laterality: Right;  . THROMBECTOMY AND REVISION OF ARTERIOVENTOUS (AV) GORETEX  GRAFT Left 03/02/2017   Procedure: THROMBECTOMY AND REVISION OF ARTERIOVENous left arm GORETEX  GRAFT;  Surgeon: Elam Dutch, MD;  Location: [place];  Service: Vascular;  Laterality: Left;  . THROMBECTOMY W/ EMBOLECTOMY Left 12/14/2016   Procedure: THROMBECTOMY OF LEFT  ARTERIO-VENOUS GORTEX GRAFT; REVISION OF LEFT  ARTERIO-VENOUS GORE-TEX GRAFT;  Surgeon: [name], MD;  Location: [place];  Service: Vascular;  Laterality: Left;    Prior to Admission medications   Medication Sig Start Date End Date Taking? Authorizing Provider  acetaminophen (TYLENOL) 500 MG tablet Take 1,000 mg by mouth every 6 (six) hours as needed for moderate pain or headache.   Yes [provider]  amLODipine (NORVASC) 10 MG tablet Take 10 mg by mouth 2 (two) times daily. 12/18/17  Yes [provider]  aspirin 81 MG chewable tablet Chew 81 mg by mouth daily.    Yes [provider]  calcium acetate (PHOSLO) 667 MG capsule Take 667 mg by mouth 3 (three) times daily. 03/02/16  Yes [provider]   Cholecalciferol (VITAMIN D3) 1000 units CAPS Take 2,000 Units by mouth daily.   Yes [provider]  acetaminophen (TYLENOL) 325 MG tablet Take 2 tablets (650 mg total) by mouth every 6 (six) hours as needed for mild pain, fever or headache. 12/24/17   Mariel Aloe, MD  calcitRIOL (ROCALTROL) 0.5 MCG capsule Take 1 capsule (0.5 mcg total) by mouth every Monday, Wednesday, and Friday with hemodialysis. 12/24/17   Mariel Aloe, MD  ondansetron (ZOFRAN ODT) 4 MG disintegrating tablet Take 1 tablet (4 mg total) by mouth every 8 (eight) hours as needed for nausea or vomiting. 12/24/17   Mariel Aloe, MD  pantoprazole (PROTONIX) 40 MG tablet Take 1 tablet (40 mg total) by mouth daily. 12/24/17   Mariel Aloe, MD    Current Facility-Administered Medications  Medication Dose Route Frequency Provider Last Rate Last Dose  . acetaminophen (TYLENOL) tablet 650 mg  650 mg Oral Q6H PRN Ivor Costa, MD   [phone]  mg at 12/24/17 1728  . amLODipine (NORVASC) tablet 5 mg  5 mg Oral BID Corliss Parish, MD      . calcitRIOL (ROCALTROL) capsule 0.5 mcg  0.5 mcg Oral Q M,W,F-HD Corliss Parish, MD   0.5 mcg at 12/24/17 1727  . calcium acetate (PHOSLO) capsule 667 mg  667 mg Oral TID WC Ivor Costa, MD   667 mg at 12/25/17 1305  . calcium carbonate (TUMS - dosed in mg elemental calcium) chewable tablet 200 mg of elemental calcium  1 tablet Oral Daily PRN Mariel Aloe, MD   200 mg of elemental calcium at 12/25/17 0220  . Darbepoetin Alfa (ARANESP) injection 100 mcg  100 mcg Intravenous Q Fri-HD Mariel Aloe, MD   100 mcg at 12/24/17 0949  . ferric gluconate (NULECIT) 125 mg in sodium chloride 0.9 % 100 mL IVPB  125 mg Intravenous Daily Corliss Parish, MD 110 mL/hr at 12/25/17 1305 125 mg at 12/25/17 1305  . gentamicin cream (GARAMYCIN) 0.1 % 1 application  1 application Topical Daily Estanislado Emms, MD   1 application at 83/41/96 1317  . heparin 2,500 Units in dialysis solution 2.5%  low-MG/low-CA 5,000 mL dialysis solution   Peritoneal Dialysis PRN Estanislado Emms, MD      . hydrALAZINE (APRESOLINE) injection 5 mg  5 mg Intravenous Q2H PRN Ivor Costa, MD      . morphine 4 MG/ML injection 2 mg  2 mg Intravenous Q4H PRN Ivor Costa, MD      . ondansetron [facility]) injection 4 mg  4 mg Intravenous Q8H PRN Mariel Aloe, MD   4 mg at 12/25/17 1310   Or  . ondansetron (ZOFRAN) injection 4 mg  4 mg Intramuscular Q8H PRN Mariel Aloe, MD       Or  . ondansetron (ZOFRAN-ODT) disintegrating tablet 4 mg  4 mg Oral Q8H PRN Mariel Aloe, MD      . pantoprazole (PROTONIX) EC tablet 40 mg  40 mg Oral Daily Mariel Aloe, MD   40 mg at 12/25/17 1305  . promethazine (PHENERGAN) injection 25 mg  25 mg Intravenous Q6H PRN Mariel Aloe, MD       Or  . promethazine (PHENERGAN) tablet 25 mg  25 mg Oral Q6H PRN Mariel Aloe, MD   25 mg at 12/25/17 1000  . zolpidem (AMBIEN) tablet 5 mg  5 mg Oral QHS PRN Ivor Costa, MD        Allergies as of 12/21/2017 - Review Complete 12/21/2017  Allergen Reaction Noted  . Pork-derived products Other (See Comments) 12/14/2016  . Ppd [tuberculin purified protein derivative] Rash 12/04/2016    History reviewed. No pertinent family history.  Social History   Socioeconomic History  . Marital status: Single    Spouse name: Not on file  . Number of children: Not on file  . Years of education: Not on file  . Highest education level: Not on file  Social Needs  . Financial resource strain: Not on file  . Food insecurity - worry: Not on file  . Food insecurity - inability: Not on file  . Transportation needs - medical: Not on file  . Transportation needs - non-medical: Not on file  Occupational History  . Not on file  Tobacco Use  . Smoking status: Never Smoker  . Smokeless tobacco: Never Used  Substance and Sexual Activity  . Alcohol use: No  . Drug use: No  . Sexual activity: Not  on file  Other Topics Concern  . Not on file   Social History Narrative  . Not on file    Review of Systems: All systems reviewed and negative except where noted in HPI.  Physical Exam: Vital signs in last 24 hours: Temp:  [97.4 F (36.3 C)-99.5 F (37.5 C)] 97.4 F (36.3 C) (01/26 1234) Pulse Rate:  [94-97] 94 (01/26 1234) Resp:  [18-20] 20 (01/26 1234) BP: (125-148)/(62-73) 148/70 (01/26 1234) SpO2:  [100 %] 100 % (01/26 1234) Weight:  [171 lb (77.6 kg)] 171 lb (77.6 kg) (01/26 0500) Last BM Date: 12/21/17 General:   Alert, well-developed, black male in NAD Psych:  Cooperative, flat affect, barely speaks above a whisper. Not too conversant. Eyes:  Pupils equal, sclera clear, no icterus.   Conjunctiva pink. Ears:  Normal auditory acuity. Nose:  No deformity, discharge,  or lesions. Neck:  Supple Lungs:  Clear throughout to auscultation.   No wheezes, crackles, or rhonchi.  Heart:  Regular rate and rhythm; no murmurs, no edema Abdomen:  Soft, non-distended, nontender, BS active, no palp mass    Rectal:  Deferred  Msk:  Symmetrical without gross deformities. . Pulses:  Normal pulses noted. Neurologic:  Alert and  oriented x4;  grossly normal neurologically. Skin:  Intact without significant lesions or rashes..   Intake/Output from previous day: 01/25 0701 - 01/26 0700 In: 360 [P.O.:360] Out: 2107 [Emesis/NG output:400] Intake/Output this shift: No intake/output data recorded.  Lab Results: Recent Labs    12/23/17 0453 12/24/17 0756  WBC 9.2 8.1  HGB 10.1* 9.8*  HCT 32.3* 30.6*  PLT 277 282   BMET Recent Labs    12/24/17 0756  NA 137  K 3.8  CL 95*  CO2 25  GLUCOSE 131*  BUN 57*  CREATININE 11.22*  CALCIUM 8.4*   LFT Recent Labs    12/24/17 0756  ALBUMIN 3.2*    Studies/Results: Dg Abd 1 View  Result Date: 12/25/2017 CLINICAL DATA:  Dark emesis.  Constipation. EXAM: ABDOMEN - 1 VIEW COMPARISON:  None. FINDINGS: The abdomen is gasless. No abnormal abdominal calcification is seen.  Peritoneal dialysis catheter is noted. No abnormal abdominal calcification or focal bony abnormality. IMPRESSION: No acute finding.  The abdomen is gasless. Electronically Signed   By: Inge Rise M.D.   On: 12/25/2017 13:05    Tye Savoy, NP-C @  12/25/2017, 1:46 PM Pager number [phone]

## 2017-12-25 NOTE — Consult Note (Signed)
Gastroenterology Consultation                                       (covering for Drs. Adriana Mccallum)   Referring Provider: [facility]   Primary Care Physician:  [facility], [facility] Primary Gastroenterologist:   Althia Forts - covering for Drs.Selinda Michaels  Reason for Consultation:  hematemesis  ASSESSMENT AND PLAN:    85. 62 yo male admitted with N/V/hematemesis (apparently low volume based on hgb which remains around baseline). Takes baby asa at home. Denies other NSAIDs. Of note, heme negative on admission.  No associated abdominal pain.  No melena. Other than mildly elevated alkaline phosphatase his LFTs are unremarkable.  Lipase normal.  Correlates onset of nausea and vomiting with recent addition of a new BP med but Nephrology feels this is unlikely.  -Given vomiting will change PO PPI to IV form but doesn't need continuous gtt..  -Given persistence of his symptoms and absence of other findings it is reasonable to schedule him for an EGD. The risks and benefits of EGD were discussed and the patient agrees to proceed.   2. ESRD, on PD  3 Solid food dysphagia, acute.  Started this admission.  He did not offer this complaint but mentioned it after I asked him about it.  No obvious oral Candida on exam.   -Further evaluation at time of EGD    Attending physician's note   I have taken a history, examined the patient and reviewed the chart. I agree with the Advanced Practitioner's note, impression and recommendations. Persistent nausea, vomiting, coffee ground emesis with a stable Hb. Heme negative stool on admission. He complaints of dysphagia as well. R/O candida, esophagitis, stricture, gastritis, ulcer, etc. IV PPI bid for now. EGD tomorrow morning.   Lucio Edward, MD Mar[facility] Regal [phone] Mon-Fri 8a-5p [phone] after 5p, weekends, holidays    HPI: Allen Miller is a 62 y.o. male with hypertension, history of renal cancer, and end-stage renal  disease on peritoneal dialysis.  Patient was admitted 12/21/2017 for hematemesis.  Hgb stable at the time at 10.8. Admitting team apparently spoke to unassigned GI group 2 days ago and it was decided that no inpatient workup would be needed but the patient would need outpatient colonoscopy/EGD since he had never had either one.  His nausea and vomiting has persisted, he apparently had dark emesis overnight, GI has been asked to consult. Hgb still at baseline,it is 9.8 this am. ASA being held.  No evidence for infected peritoneal fluid  Patient endorses some acute solid food dysphagia which started this admission.  He has no odynophagia.   Past Medical History:  Diagnosis Date  . Cancer [facility])    kidney cancer  . Coffee ground emesis 11/2017  . ESRD (end stage renal disease) ([place])    MWF  . Hemodialysis patient ([place])   . Hypertension   . Low iron   . Shortness of breath dyspnea    when lying flat  . Stroke [facility])    effective vision- Left eye    Past Surgical History:  Procedure Laterality Date  . A/V FISTULAGRAM Right 06/25/2017   Procedure: A/V Fistulagram;  Surgeon: Elam Dutch, MD;  Location: [place] [facility];  Service: Cardiovascular;  Laterality: Right;  . AV FISTULA PLACEMENT Left 10/27/2016   Procedure: INSERTION LEFT UPPER ARM  OF ARTERIOVENOUS (AV) GORE-TEX GRAFT ARM;  Surgeon: Aaron Edelman  Starlyn Skeans, MD;  Location: [place];  Service: Vascular;  Laterality: Left;  . AV FISTULA PLACEMENT Right 03/23/2017   Procedure: ARTERIOVENOUS (AV) FISTULA CREATION;  Surgeon: Elam Dutch, MD;  Location: [place];  Service: Vascular;  Laterality: Right;  . BASCILIC VEIN TRANSPOSITION Left 02/24/2016   Procedure: BASILIC VEIN TRANSPOSITION VERSUS ARTERIOVENOUS GRAFT INSERTION;  Surgeon: [name], MD;  Location: [place];  Service: Vascular;  Laterality: Left;  . BASCILIC VEIN TRANSPOSITION Left 07/14/2016   Procedure: LEFT BASILIC VEIN TRANSPOSITION;  Surgeon: Conrad Sedgwick, MD;  Location: [place];   Service: Vascular;  Laterality: Left;  . BASCILIC VEIN TRANSPOSITION Left 09/08/2016   Procedure: SECOND STAGE BASILIC VEIN TRANSPOSITION LEFT UPPER ARM;  Surgeon: Conrad Osseo, MD;  Location: [place];  Service: Vascular;  Laterality: Left;  . INSERTION OF DIALYSIS CATHETER N/A 02/24/2016   Procedure: INSERTION OF DIALYSIS CATHETER;  Surgeon: [name], MD;  Location: [place];  Service: Vascular;  Laterality: N/A;  . INSERTION OF DIALYSIS CATHETER Right 10/27/2016   Procedure: EXCHANGE OF RIGHT INTERNAL JUGULAR DIALYSIS CATHETER;  Surgeon: Conrad [place], MD;  Location: [place];  Service: Vascular;  Laterality: Right;  . PERIPHERAL VASCULAR BALLOON ANGIOPLASTY Right 06/25/2017   Procedure: Peripheral Vascular Balloon Angioplasty;  Surgeon: Elam Dutch, MD;  Location: [place] [facility];  Service: Cardiovascular;  Laterality: Right;  . THROMBECTOMY AND REVISION OF ARTERIOVENTOUS (AV) GORETEX  GRAFT Left 03/02/2017   Procedure: THROMBECTOMY AND REVISION OF ARTERIOVENous left arm GORETEX  GRAFT;  Surgeon: Elam Dutch, MD;  Location: [place];  Service: Vascular;  Laterality: Left;  . THROMBECTOMY W/ EMBOLECTOMY Left 12/14/2016   Procedure: THROMBECTOMY OF LEFT  ARTERIO-VENOUS GORTEX GRAFT; REVISION OF LEFT  ARTERIO-VENOUS GORE-TEX GRAFT;  Surgeon: [name], MD;  Location: [place];  Service: Vascular;  Laterality: Left;    Prior to Admission medications   Medication Sig Start Date End Date Taking? Authorizing Provider  acetaminophen (TYLENOL) 500 MG tablet Take 1,000 mg by mouth every 6 (six) hours as needed for moderate pain or headache.   Yes [provider]  amLODipine (NORVASC) 10 MG tablet Take 10 mg by mouth 2 (two) times daily. 12/18/17  Yes [provider]  aspirin 81 MG chewable tablet Chew 81 mg by mouth daily.    Yes [provider]  calcium acetate (PHOSLO) 667 MG capsule Take 667 mg by mouth 3 (three) times daily. 03/02/16  Yes [provider]   Cholecalciferol (VITAMIN D3) 1000 units CAPS Take 2,000 Units by mouth daily.   Yes [provider]  acetaminophen (TYLENOL) 325 MG tablet Take 2 tablets (650 mg total) by mouth every 6 (six) hours as needed for mild pain, fever or headache. 12/24/17   Mariel Aloe, MD  calcitRIOL (ROCALTROL) 0.5 MCG capsule Take 1 capsule (0.5 mcg total) by mouth every Monday, Wednesday, and Friday with hemodialysis. 12/24/17   Mariel Aloe, MD  ondansetron (ZOFRAN ODT) 4 MG disintegrating tablet Take 1 tablet (4 mg total) by mouth every 8 (eight) hours as needed for nausea or vomiting. 12/24/17   Mariel Aloe, MD  pantoprazole (PROTONIX) 40 MG tablet Take 1 tablet (40 mg total) by mouth daily. 12/24/17   Mariel Aloe, MD    Current Facility-Administered Medications  Medication Dose Route Frequency Provider Last Rate Last Dose  . acetaminophen (TYLENOL) tablet 650 mg  650 mg Oral Q6H PRN Ivor Costa, MD   [phone]  mg at 12/24/17 1728  . amLODipine (NORVASC) tablet 5 mg  5 mg Oral BID Corliss Parish, MD      . calcitRIOL (ROCALTROL) capsule 0.5 mcg  0.5 mcg Oral Q M,W,F-HD Corliss Parish, MD   0.5 mcg at 12/24/17 1727  . calcium acetate (PHOSLO) capsule 667 mg  667 mg Oral TID WC Ivor Costa, MD   667 mg at 12/25/17 1305  . calcium carbonate (TUMS - dosed in mg elemental calcium) chewable tablet 200 mg of elemental calcium  1 tablet Oral Daily PRN Mariel Aloe, MD   200 mg of elemental calcium at 12/25/17 0220  . Darbepoetin Alfa (ARANESP) injection 100 mcg  100 mcg Intravenous Q Fri-HD Mariel Aloe, MD   100 mcg at 12/24/17 0949  . ferric gluconate (NULECIT) 125 mg in sodium chloride 0.9 % 100 mL IVPB  125 mg Intravenous Daily Corliss Parish, MD 110 mL/hr at 12/25/17 1305 125 mg at 12/25/17 1305  . gentamicin cream (GARAMYCIN) 0.1 % 1 application  1 application Topical Daily Estanislado Emms, MD   1 application at 12/87/86 1317  . heparin 2,500 Units in dialysis solution 2.5%  low-MG/low-CA 5,000 mL dialysis solution   Peritoneal Dialysis PRN Estanislado Emms, MD      . hydrALAZINE (APRESOLINE) injection 5 mg  5 mg Intravenous Q2H PRN Ivor Costa, MD      . morphine 4 MG/ML injection 2 mg  2 mg Intravenous Q4H PRN Ivor Costa, MD      . ondansetron [facility]) injection 4 mg  4 mg Intravenous Q8H PRN Mariel Aloe, MD   4 mg at 12/25/17 1310   Or  . ondansetron (ZOFRAN) injection 4 mg  4 mg Intramuscular Q8H PRN Mariel Aloe, MD       Or  . ondansetron (ZOFRAN-ODT) disintegrating tablet 4 mg  4 mg Oral Q8H PRN Mariel Aloe, MD      . pantoprazole (PROTONIX) EC tablet 40 mg  40 mg Oral Daily Mariel Aloe, MD   40 mg at 12/25/17 1305  . promethazine (PHENERGAN) injection 25 mg  25 mg Intravenous Q6H PRN Mariel Aloe, MD       Or  . promethazine (PHENERGAN) tablet 25 mg  25 mg Oral Q6H PRN Mariel Aloe, MD   25 mg at 12/25/17 1000  . zolpidem (AMBIEN) tablet 5 mg  5 mg Oral QHS PRN Ivor Costa, MD        Allergies as of 12/21/2017 - Review Complete 12/21/2017  Allergen Reaction Noted  . Pork-derived products Other (See Comments) 12/14/2016  . Ppd [tuberculin purified protein derivative] Rash 12/04/2016    History reviewed. No pertinent family history.  Social History   Socioeconomic History  . Marital status: Single    Spouse name: Not on file  . Number of children: Not on file  . Years of education: Not on file  . Highest education level: Not on file  Social Needs  . Financial resource strain: Not on file  . Food insecurity - worry: Not on file  . Food insecurity - inability: Not on file  . Transportation needs - medical: Not on file  . Transportation needs - non-medical: Not on file  Occupational History  . Not on file  Tobacco Use  . Smoking status: Never Smoker  . Smokeless tobacco: Never Used  Substance and Sexual Activity  . Alcohol use: No  . Drug use: No  . Sexual activity: Not  on file  Other Topics Concern  . Not on file   Social History Narrative  . Not on file    Review of Systems: All systems reviewed and negative except where noted in HPI.  Physical Exam: Vital signs in last 24 hours: Temp:  [97.4 F (36.3 C)-99.5 F (37.5 C)] 97.4 F (36.3 C) (01/26 1234) Pulse Rate:  [94-97] 94 (01/26 1234) Resp:  [18-20] 20 (01/26 1234) BP: (125-148)/(62-73) 148/70 (01/26 1234) SpO2:  [100 %] 100 % (01/26 1234) Weight:  [171 lb (77.6 kg)] 171 lb (77.6 kg) (01/26 0500) Last BM Date: 12/21/17 General:   Alert, well-developed, black male in NAD Psych:  Cooperative, flat affect, barely speaks above a whisper. Not too conversant. Eyes:  Pupils equal, sclera clear, no icterus.   Conjunctiva pink. Ears:  Normal auditory acuity. Nose:  No deformity, discharge,  or lesions. Neck:  Supple Lungs:  Clear throughout to auscultation.   No wheezes, crackles, or rhonchi.  Heart:  Regular rate and rhythm; no murmurs, no edema Abdomen:  Soft, non-distended, nontender, BS active, no palp mass    Rectal:  Deferred  Msk:  Symmetrical without gross deformities. . Pulses:  Normal pulses noted. Neurologic:  Alert and  oriented x4;  grossly normal neurologically. Skin:  Intact without significant lesions or rashes..   Intake/Output from previous day: 01/25 0701 - 01/26 0700 In: 360 [P.O.:360] Out: 2107 [Emesis/NG output:400] Intake/Output this shift: No intake/output data recorded.  Lab Results: Recent Labs    12/23/17 0453 12/24/17 0756  WBC 9.2 8.1  HGB 10.1* 9.8*  HCT 32.3* 30.6*  PLT 277 282   BMET Recent Labs    12/24/17 0756  NA 137  K 3.8  CL 95*  CO2 25  GLUCOSE 131*  BUN 57*  CREATININE 11.22*  CALCIUM 8.4*   LFT Recent Labs    12/24/17 0756  ALBUMIN 3.2*    Studies/Results: Dg Abd 1 View  Result Date: 12/25/2017 CLINICAL DATA:  Dark emesis.  Constipation. EXAM: ABDOMEN - 1 VIEW COMPARISON:  None. FINDINGS: The abdomen is gasless. No abnormal abdominal calcification is seen.  Peritoneal dialysis catheter is noted. No abnormal abdominal calcification or focal bony abnormality. IMPRESSION: No acute finding.  The abdomen is gasless. Electronically Signed   By: Inge Rise M.D.   On: 12/25/2017 13:05    Tye Savoy, NP-C @  12/25/2017, 1:46 PM Pager number [phone]

## 2017-12-25 NOTE — Progress Notes (Signed)
Subjective:  s/p HD yest, removed 1700 tolerated well -Has spot at [facility] in [place].  Says he threw up last night and this AM- thinks is due to his BP meds ??  Objective Vital signs in last 24 hours: Vitals:   12/24/17 1725 12/24/17 2040 12/25/17 0500 12/25/17 0701  BP:  (!) 144/73  125/62  Pulse:  97  97  Resp:  18  18  Temp: 99.5 F (37.5 C) 99.1 F (37.3 C)  98.6 F (37 C)  TempSrc: Oral Oral  Oral  SpO2:  100%  100%  Weight:   77.6 kg (171 lb)   Height:       Weight change: 0.015 kg (0.5 oz)  Intake/Output Summary (Last 24 hours) at 12/25/2017 0827 Last data filed at 12/24/2017 2210 Gross per 24 hour  Intake 360 ml  Output 1707 ml  Net -1347 ml     Assessment/Plan: 62 year old black male with ESRD but managed through the [facility] in [place] attempted on PD but now wants to transition back to hemodialysis.  He is admitted for coffee-ground emesis 1.Renal-ESRD-wants to transition from PD back to hemo receives that through the [facility] in [place].  He has had PD fluid drained and we did HD Wed and Fri via PC.  He reports attempted access in the past that has been unsuccessful.  Is not likely we will be able to get PD catheter removed this hospitalization.  I will defer to the [facility].  We have contacted -  he has an outpatient hemodialysis spot as early as Monday when ready for discharge.   2. Hypertension/volume  -blood pressure is better after HD.  On amlodipine as outpatient and was increased.  He seems to think his nausea is related to BP meds- will decrease dose back to home.  UF as able with HD 3. Anemia  -due to CKD but also evidence of recent GI bleed.  hgb pretty stable around 10 since 1/22.  Fecal occult positive.   Holding aspirin, on PPI-plan per them.  Have added ESA- iron  Sat 25- will add some iron as well  4.  Bones-we will continue outpatient PhosLo.  We will check phosphorus and intact PTH (784)- add calcitriol 0.5 TIW 5. Nausea- I dont think is related to  any of his meds/PD fluid did not show evidence of infection- per primary on PPI and phenergan- will keep him here today     Letesha Klecker A    Labs: Basic Metabolic Panel: Recent Labs  Lab 12/21/17 1744 12/22/17 0322 12/24/17 0756  NA 136 139 137  K 3.7 4.0 3.8  CL 91* 93* 95*  CO2 22 25 25   GLUCOSE 260* 210* 131*  BUN 69* 72* 57*  CREATININE 15.71* 15.17* 11.22*  CALCIUM 8.6* 8.1* 8.4*  PHOS  --   --  5.7*   Liver Function Tests: Recent Labs  Lab 12/21/17 1744 12/24/17 0756  AST 17  --   ALT 17  --   ALKPHOS 153*  --   BILITOT 0.7  --   PROT 8.9*  --   ALBUMIN 4.0 3.2*   Recent Labs  Lab 12/21/17 1744  LIPASE 36   No results for input(s): AMMONIA in the last 168 hours. CBC: Recent Labs  Lab 12/21/17 1744 12/21/17 2120 12/22/17 0322 12/23/17 0453 12/24/17 0756  WBC 11.0* 11.8* 12.5* 9.2 8.1  HGB 10.8* 9.7* 9.9* 10.1* 9.8*  HCT 33.5* 30.5* 30.3* 32.3* 30.6*  MCV 82.9 83.6 82.8 84.3  84.5  PLT 332 276 321 277 282   Cardiac Enzymes: Recent Labs  Lab 12/21/17 2120 12/22/17 0322 12/22/17 0910  TROPONINI <0.03 0.07* 0.06*   CBG: Recent Labs  Lab 12/22/17 0630 12/22/17 2350 12/23/17 0623 12/24/17 0605 12/25/17 0714  GLUCAP 189* 125* 165* 138* 133*    Iron Studies:  Recent Labs    12/22/17 1752  IRON 54  TIBC 214*  FERRITIN 479*   Studies/Results: Dg Chest 2 View  Result Date: 12/23/2017 CLINICAL DATA:  Cough, shortness of breath, and weakness. History of dialysis dependent renal failure, renal malignancy EXAM: CHEST  2 VIEW COMPARISON:  Chest x-ray of December 21, 2017 FINDINGS: The lungs are well-expanded. There is no focal infiltrate. There is stable scarring lateral to the left cardiac apex. There is no pleural effusion. The cardiac silhouette is top-normal in size. The pulmonary vascularity is normal. The dialysis catheter tip projects over the cavoatrial junction. There degenerative and/or post traumatic changes of the left  humeral head and neck. IMPRESSION: There is no pneumonia, pulmonary edema, nor other acute cardiopulmonary abnormality. Electronically Signed   By: David  Martinique M.D.   On: 12/23/2017 13:03   Medications: Infusions: . ferric gluconate (FERRLECIT/NULECIT) IV 125 mg (12/24/17 0954)    Scheduled Medications: . amLODipine  10 mg Oral BID  . calcitRIOL  0.5 mcg Oral Q M,W,F-HD  . calcium acetate  667 mg Oral TID WC  . darbepoetin (ARANESP) injection - DIALYSIS  100 mcg Intravenous Q Fri-HD  . gentamicin cream  1 application Topical Daily  . pantoprazole  40 mg Oral Daily    have reviewed scheduled and prn medications.  Physical Exam: General: NAD Heart: tachy Lungs: mostly clear Abdomen: obese, soft, non tender- PD cath in place  Extremities: minimal edema Dialysis Access: right IJ PC     12/25/2017,8:27 AM  LOS: 4 days

## 2017-12-26 ENCOUNTER — Encounter (HOSPITAL_COMMUNITY): Payer: Self-pay | Admitting: Gastroenterology

## 2017-12-26 ENCOUNTER — Encounter (HOSPITAL_COMMUNITY)
Admission: EM | Disposition: A | Discharge status: Home / Self Care | Payer: Self-pay | Source: Home / Self Care | Attending: Family Medicine

## 2017-12-26 DIAGNOSIS — R112 Nausea with vomiting, unspecified: Secondary | ICD-10-CM

## 2017-12-26 DIAGNOSIS — K2210 Ulcer of esophagus without bleeding: Secondary | ICD-10-CM

## 2017-12-26 DIAGNOSIS — K2100 Gastro-esophageal reflux disease with esophagitis, without bleeding: Secondary | ICD-10-CM

## 2017-12-26 DIAGNOSIS — R1310 Dysphagia, unspecified: Secondary | ICD-10-CM

## 2017-12-26 HISTORY — PX: ESOPHAGOGASTRODUODENOSCOPY: SHX5428

## 2017-12-26 LAB — CULTURE, BLOOD (ROUTINE X 2)
CULTURE: NO GROWTH
Culture: NO GROWTH
SPECIAL REQUESTS: ADEQUATE
Special Requests: ADEQUATE

## 2017-12-26 LAB — GLUCOSE, CAPILLARY: GLUCOSE-CAPILLARY: 146 mg/dL — AB (ref 65–99)

## 2017-12-26 SURGERY — EGD (ESOPHAGOGASTRODUODENOSCOPY)
Anesthesia: Moderate Sedation

## 2017-12-26 MED ORDER — MIDAZOLAM HCL 5 MG/ML IJ SOLN
INTRAMUSCULAR | Status: AC
  Filled 2017-12-26: qty 3

## 2017-12-26 MED ORDER — INSULIN ASPART 100 UNIT/ML ~~LOC~~ SOLN: 0.0000 [IU] | Freq: Three times a day (TID) | SUBCUTANEOUS | Status: DC

## 2017-12-26 MED ORDER — FENTANYL CITRATE (PF) 100 MCG/2ML IJ SOLN
INTRAMUSCULAR | Status: DC | PRN
  Administered 2017-12-26 (×2): 25 ug via INTRAVENOUS

## 2017-12-26 MED ORDER — CALCITRIOL 0.5 MCG PO CAPS: 0.5000 ug | ORAL_CAPSULE | ORAL | 0 refills | Status: AC

## 2017-12-26 MED ORDER — SODIUM CHLORIDE 0.9 % IV SOLN
INTRAVENOUS | Status: AC | PRN
  Administered 2017-12-26: 500 mL via INTRAMUSCULAR

## 2017-12-26 MED ORDER — BUTAMBEN-TETRACAINE-BENZOCAINE 2-2-14 % EX AERO
INHALATION_SPRAY | CUTANEOUS | Status: DC | PRN
  Administered 2017-12-26: 2 via TOPICAL

## 2017-12-26 MED ORDER — PANTOPRAZOLE SODIUM 40 MG PO TBEC: 40.0000 mg | DELAYED_RELEASE_TABLET | Freq: Two times a day (BID) | ORAL | 0 refills | Status: DC

## 2017-12-26 MED ORDER — ONDANSETRON 4 MG PO TBDP: 4.0000 mg | ORAL_TABLET | Freq: Three times a day (TID) | ORAL | 0 refills | Status: AC | PRN

## 2017-12-26 MED ORDER — MIDAZOLAM HCL 10 MG/2ML IJ SOLN
INTRAMUSCULAR | Status: DC | PRN
  Administered 2017-12-26 (×2): 2 mg via INTRAVENOUS

## 2017-12-26 MED ORDER — FENTANYL CITRATE (PF) 100 MCG/2ML IJ SOLN
INTRAMUSCULAR | Status: AC
  Filled 2017-12-26: qty 4

## 2017-12-26 NOTE — Op Note (Signed)
[facility] Patient Name: Allen Miller Procedure Date : 12/26/2017 MRN: [id number] Attending MD: Ladene Artist , MD Date of Birth: Jun 09, 1956 CSN: [id number] Age: 62 Admit Type: Inpatient Procedure:                Upper GI endoscopy Indications:              Dysphagia, Coffee-ground emesis, Persistent nausea,                            vomiting of unknown cause Providers:                Pricilla Riffle. Fuller Plan, MD, Angus Seller, Tinnie Gens,                            Technician Referring MD:             [facility] Medicines:                Fentanyl 50 micrograms IV, Midazolam 4 mg IV Complications:            No immediate complications. Estimated Blood Loss:     Estimated blood loss was minimal. Procedure:                Pre-Anesthesia Assessment:                           - Prior to the procedure, a History and Physical                            was performed, and patient medications and                            allergies were reviewed. The patient's tolerance of                            previous anesthesia was also reviewed. The risks                            and benefits of the procedure and the sedation                            options and risks were discussed with the patient.                            All questions were answered, and informed consent                            was obtained. Prior Anticoagulants: The patient has                            taken no previous anticoagulant or antiplatelet                            agents. ASA Grade Assessment: III - A patient with  severe systemic disease. After reviewing the risks                            and benefits, the patient was deemed in                            satisfactory condition to undergo the procedure.                           After obtaining informed consent, the endoscope was                            passed under direct vision. Throughout the                        procedure, the patient's blood pressure, pulse, and                            oxygen saturations were monitored continuously. The                            EG-[id number] ([id number]) scope was introduced through the                            mouth, and advanced to the second part of duodenum.                            The upper GI endoscopy was accomplished without                            difficulty. The patient tolerated the procedure                            well. Scope In: Scope Out: Findings:      LA Grade C (one or more mucosal breaks continuous between tops of 2 or       more mucosal folds, less than 75% circumference) esophagitis with no       bleeding was found in the distal esophagus. Several area with white       exudate-R/O candida. Biopsies were taken with a cold forceps for       histology.      The exam of the esophagus was otherwise normal.      The entire examined stomach was normal.      The duodenal bulb and second portion of the duodenum were normal. Impression:               - LA Grade C reflux esophagitis. Biopsied.                           - Normal stomach.                           - Normal duodenal bulb and second portion of the  duodenum. Moderate Sedation:      Moderate (conscious) sedation was administered by the endoscopy nurse       and supervised by the endoscopist. The following parameters were       monitored: oxygen saturation, heart rate, blood pressure, respiratory       rate, EKG, adequacy of pulmonary ventilation, and response to care.       Total physician intraservice time was 12 minutes. Recommendation:           - Return patient to hospital ward for ongoing care.                           - Full liquid diet today.                           - Continue present medications including PPI IV bid                            for now.                           - Await pathology results.                            - Drs. Collene Mares and Benson Norway to assume care on Monday. Procedure Code(s):        --- Professional ---                           [phone], Esophagogastroduodenoscopy, flexible,                            transoral; with biopsy, single or multiple                           99152, Moderate sedation services provided by the                            same physician or other qualified health care                            professional performing the diagnostic or                            therapeutic service that the sedation supports,                            requiring the presence of an independent trained                            observer to assist in the monitoring of the                            patient's level of consciousness and physiological                            status; initial 15 minutes of intraservice time,  patient age 5 years or older Diagnosis Code(s):        --- Professional ---                           K21.0, Gastro-esophageal reflux disease with                            esophagitis                           R13.10, Dysphagia, unspecified                           K92.0, Hematemesis                           R11.10, Vomiting, unspecified CPT copyright 2016 [employer]. All rights reserved. The codes documented in this report are preliminary and upon coder review may  be revised to meet current compliance requirements. Ladene Artist, MD 12/26/2017 10:18:43 AM This report has been signed electronically. Number of Addenda: 0

## 2017-12-26 NOTE — Progress Notes (Signed)
Subjective:  GI saw him- planning on EGD today- sleeping soundly with blanket over head today   Objective Vital signs in last 24 hours: Vitals:   12/25/17 0701 12/25/17 1234 12/25/17 2210 12/26/17 0450  BP: 125/62 (!) 148/70 138/66 (!) 147/71  Pulse: 97 94 91 86  Resp: 18 20 18 18   Temp: 98.6 F (37 C) (!) 97.4 F (36.3 C) 99.8 F (37.7 C) 98.9 F (37.2 C)  TempSrc: Oral Oral Oral Oral  SpO2: 100% 100% 99% 97%  Weight:    78.3 kg (172 lb 9.6 oz)  Height:       Weight change: -3.009 kg (-10.1 oz)  Intake/Output Summary (Last 24 hours) at 12/26/2017 0858 Last data filed at 12/26/2017 8295 Gross per 24 hour  Intake 480 ml  Output 0 ml  Net 480 ml     Assessment/Plan: 62 year old black male with ESRD but managed through the [facility] in [place] attempted on PD but now wants to transition back to hemodialysis.  He is admitted for coffee-ground emesis 1.Renal-ESRD-wants to transition from PD back to hemo receives that through the [facility] in [place].  He has had PD fluid drained and we did HD Wed and Fri via PC.  He reports attempted access in the past that has been unsuccessful.  Is not likely we will be able to get PD catheter removed this hospitalization.  I will defer to the [facility].  We have contacted -  he has an outpatient hemodialysis spot MWF at the [facility]- will likely stay here to get treatment tomorrow.  [place] hopefully soon discharged cont HD there 2. Hypertension/volume  -blood pressure is better after HD.  On amlodipine as outpatient and was increased.  He seems to think his nausea is related to BP meds- will decrease dose back to home.  UF as able with HD 3. Anemia  -due to CKD but also evidence of recent GI bleed.  hgb pretty stable around 10 since 1/22.  Fecal occult positive.   Holding aspirin, on PPI-plan per them.  On ESA and iron 4.  Bones-we will continue outpatient PhosLo.  We will check phosphorus and intact PTH (784)- add calcitriol 0.5 TIW 5. Nausea- I dont think is  related to any of his meds/PD fluid did not show evidence of infection- per primary on PPI and phenergan- will keep him here today - now plan is for EGD     Whitten Andreoni A    Labs: Basic Metabolic Panel: Recent Labs  Lab 12/21/17 1744 12/22/17 0322 12/24/17 0756  NA 136 139 137  K 3.7 4.0 3.8  CL 91* 93* 95*  CO2 22 25 25   GLUCOSE 260* 210* 131*  BUN 69* 72* 57*  CREATININE 15.71* 15.17* 11.22*  CALCIUM 8.6* 8.1* 8.4*  PHOS  --   --  5.7*   Liver Function Tests: Recent Labs  Lab 12/21/17 1744 12/24/17 0756  AST 17  --   ALT 17  --   ALKPHOS 153*  --   BILITOT 0.7  --   PROT 8.9*  --   ALBUMIN 4.0 3.2*   Recent Labs  Lab 12/21/17 1744  LIPASE 36   No results for input(s): AMMONIA in the last 168 hours. CBC: Recent Labs  Lab 12/21/17 1744 12/21/17 2120 12/22/17 0322 12/23/17 0453 12/24/17 0756  WBC 11.0* 11.8* 12.5* 9.2 8.1  HGB 10.8* 9.7* 9.9* 10.1* 9.8*  HCT 33.5* 30.5* 30.3* 32.3* 30.6*  MCV 82.9 83.6 82.8 84.3 84.5  PLT 332 276  321 277 282   Cardiac Enzymes: Recent Labs  Lab 12/21/17 2120 12/22/17 0322 12/22/17 0910  TROPONINI <0.03 0.07* 0.06*   CBG: Recent Labs  Lab 12/22/17 2350 12/23/17 0623 12/24/17 0605 12/25/17 0714 12/26/17 0721  GLUCAP 125* 165* 138* 133* 146*    Iron Studies:  No results for input(s): IRON, TIBC, TRANSFERRIN, FERRITIN in the last 72 hours. Studies/Results: Dg Abd 1 View  Result Date: 12/25/2017 CLINICAL DATA:  Dark emesis.  Constipation. EXAM: ABDOMEN - 1 VIEW COMPARISON:  None. FINDINGS: The abdomen is gasless. No abnormal abdominal calcification is seen. Peritoneal dialysis catheter is noted. No abnormal abdominal calcification or focal bony abnormality. IMPRESSION: No acute finding.  The abdomen is gasless. Electronically Signed   By: Inge Rise M.D.   On: 12/25/2017 13:05   Medications: Infusions: . ferric gluconate (FERRLECIT/NULECIT) IV Stopped (12/25/17 1405)    Scheduled  Medications: . amLODipine  5 mg Oral BID  . calcitRIOL  0.5 mcg Oral Q M,W,F-HD  . calcium acetate  667 mg Oral TID WC  . darbepoetin (ARANESP) injection - DIALYSIS  100 mcg Intravenous Q Fri-HD  . gentamicin cream  1 application Topical Daily  . pantoprazole (PROTONIX) IV  40 mg Intravenous Q24H    have reviewed scheduled and prn medications.  Physical Exam: General: NAD Heart: tachy Lungs: mostly clear Abdomen: obese, soft, non tender- PD cath in place  Extremities: minimal edema Dialysis Access: right IJ PC     12/26/2017,8:58 AM  LOS: 5 days

## 2017-12-26 NOTE — Progress Notes (Signed)
Patient returned from endoscopy, alert and oriented.  VSS.  Will continue to monitor.

## 2017-12-26 NOTE — Progress Notes (Signed)
Patient given discharge information and all questions answered.

## 2017-12-26 NOTE — Plan of Care (Signed)
  Pain Managment: General experience of comfort will improve 12/26/2017 0817 - Completed/Met by Evert Kohl, RN

## 2017-12-26 NOTE — Interval H&P Note (Signed)
History and Physical Interval Note:  12/26/2017 9:36 AM  [name]  has presented today for surgery, with the diagnosis of hematemesis  The various methods of treatment have been discussed with the patient and family. After consideration of risks, benefits and other options for treatment, the patient has consented to  Procedure(s): ESOPHAGOGASTRODUODENOSCOPY (EGD) (N/A) as a surgical intervention .  The patient's history has been reviewed, patient examined, no change in status, stable for surgery.  I have reviewed the patient's chart and labs.  Questions were answered to the patient's satisfaction.     Pricilla Riffle. Fuller Plan

## 2017-12-30 ENCOUNTER — Encounter: Payer: Self-pay | Admitting: Gastroenterology

## 2018-04-24 IMAGING — DX DG CHEST 2V
2 series · 2 of 2 positions shown · non-contrast
Comparison: 10/27/2016

CLINICAL DATA: Right-sided chest pain, nausea and vomiting with
dizziness. End-stage renal disease.

EXAM:
CHEST  2 VIEW

[x chest ap]
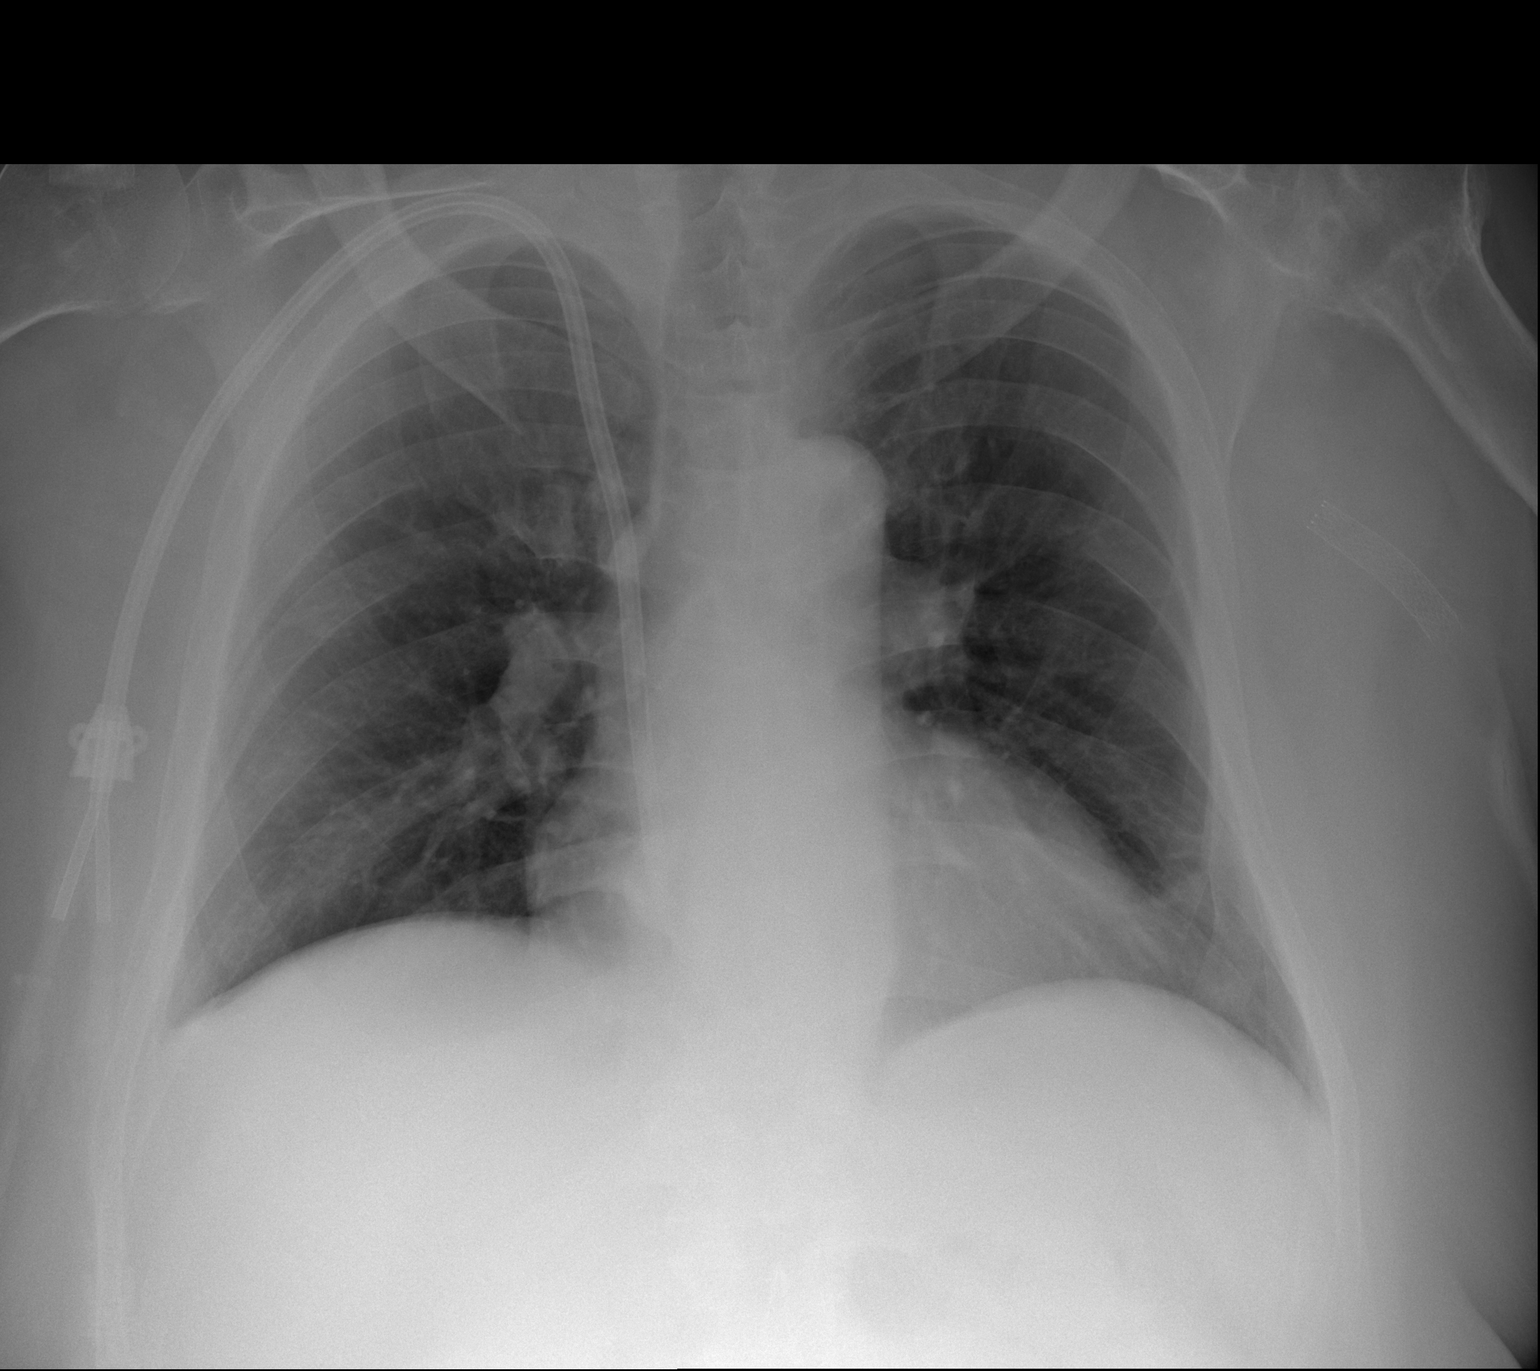

[w chest lat]
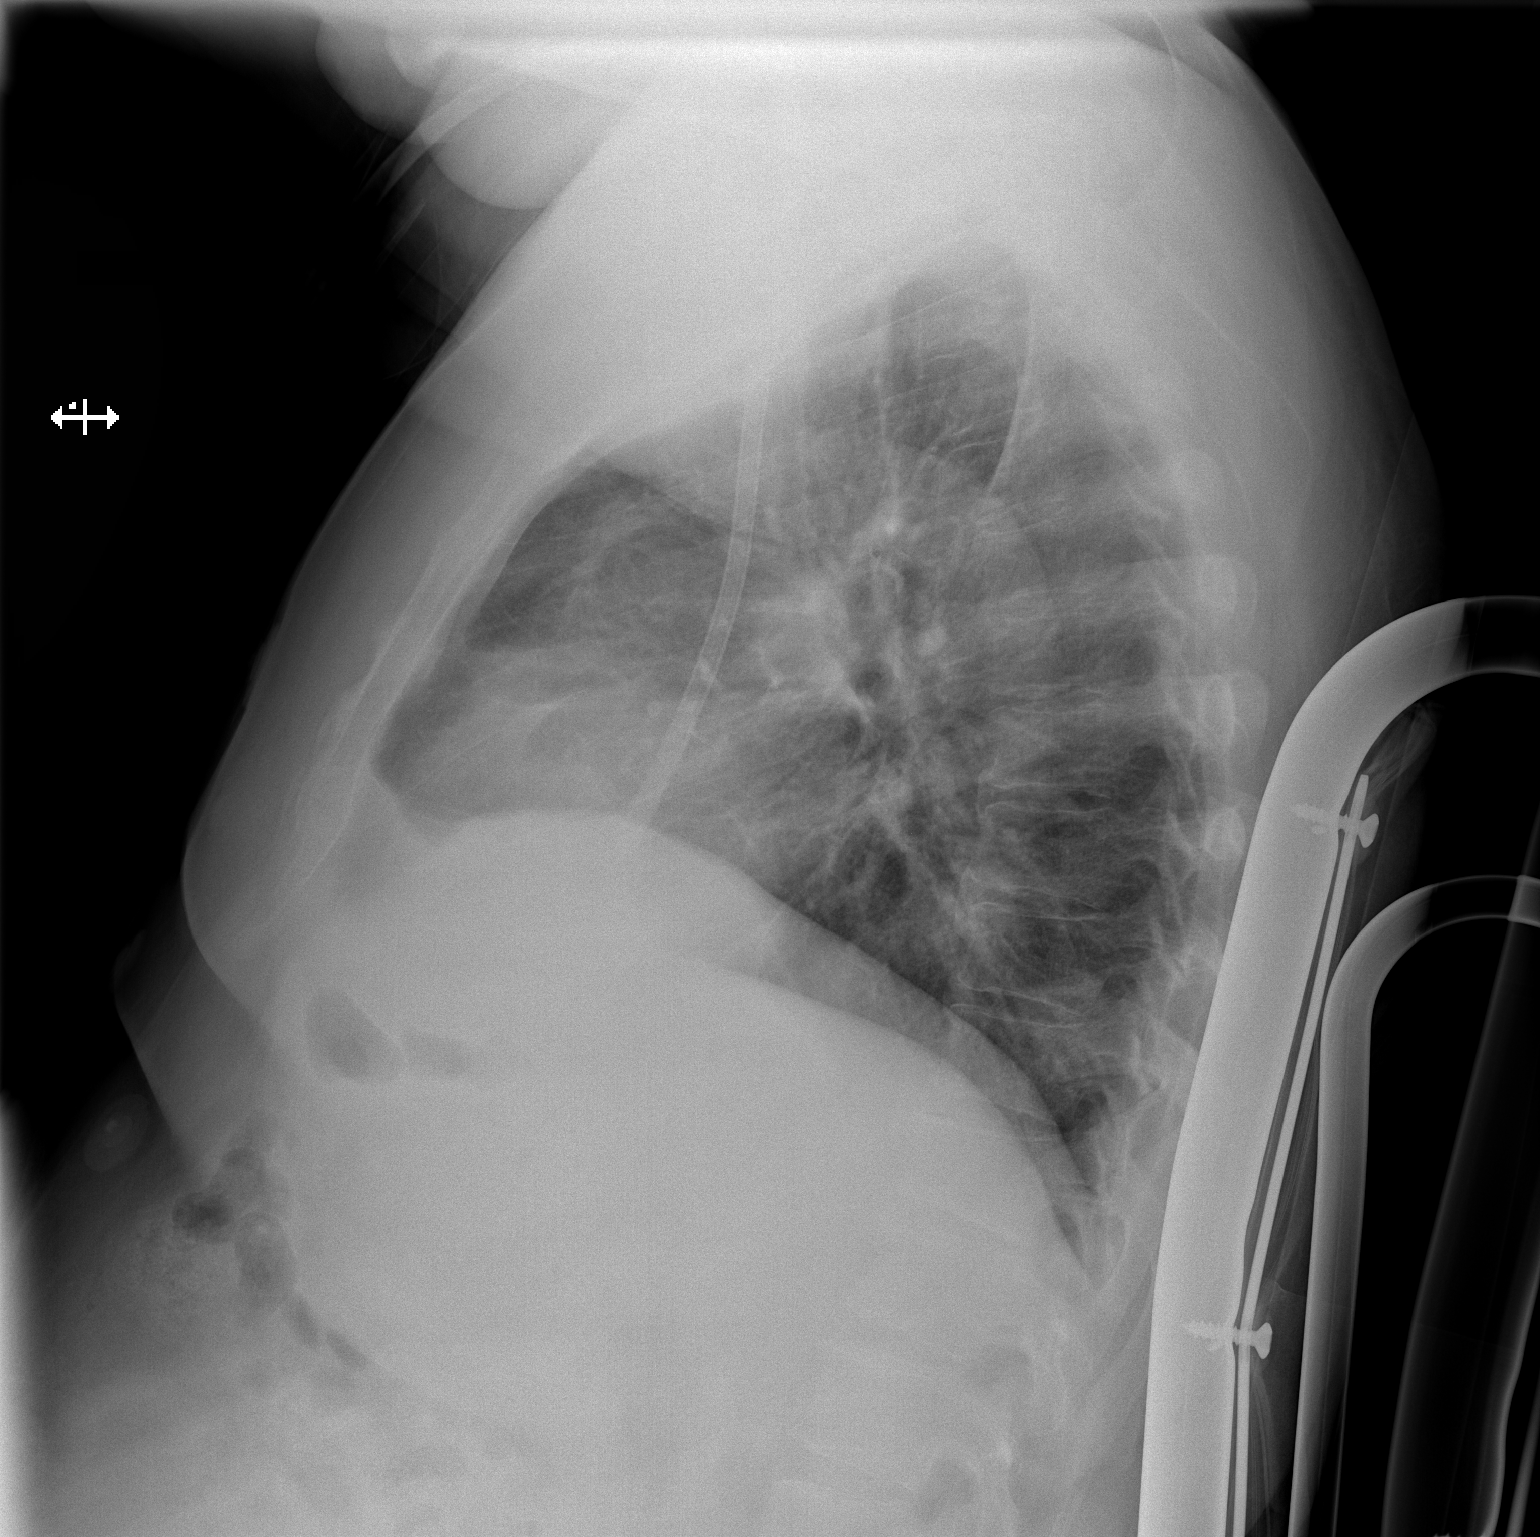

[2 of 2 positions shown; findings below may reference images not displayed]

FINDINGS: The heart size and mediastinal contours are within normal limits.
Tip of right-sided dialysis catheter is seen in the mid right
atrium. Both lungs are clear. The visualized skeletal structures are
unremarkable.
IMPRESSION: No active cardiopulmonary disease. Tip of right-sided dialysis
catheter is seen in the mid right atrium.

## 2018-07-25 ENCOUNTER — Other Ambulatory Visit: Payer: Self-pay

## 2018-07-25 ENCOUNTER — Inpatient Hospital Stay (HOSPITAL_COMMUNITY)
Admission: EM | Admit: 2018-07-25 | Discharge: 2018-07-26 | DRG: 314 | Disposition: A | Discharge status: Home / Self Care | Payer: BLUE CROSS/BLUE SHIELD | Attending: Internal Medicine | Admitting: Internal Medicine

## 2018-07-25 ENCOUNTER — Encounter (HOSPITAL_COMMUNITY): Payer: Self-pay

## 2018-07-25 DIAGNOSIS — Y832 Surgical operation with anastomosis, bypass or graft as the cause of abnormal reaction of the patient, or of later complication, without mention of misadventure at the time of the procedure: Secondary | ICD-10-CM | POA: Diagnosis present

## 2018-07-25 DIAGNOSIS — D631 Anemia in chronic kidney disease: Secondary | ICD-10-CM | POA: Diagnosis present

## 2018-07-25 DIAGNOSIS — Z887 Allergy status to serum and vaccine status: Secondary | ICD-10-CM | POA: Diagnosis not present

## 2018-07-25 DIAGNOSIS — H539 Unspecified visual disturbance: Secondary | ICD-10-CM | POA: Diagnosis present

## 2018-07-25 DIAGNOSIS — Z85528 Personal history of other malignant neoplasm of kidney: Secondary | ICD-10-CM

## 2018-07-25 DIAGNOSIS — I69398 Other sequelae of cerebral infarction: Secondary | ICD-10-CM | POA: Diagnosis not present

## 2018-07-25 DIAGNOSIS — M898X9 Other specified disorders of bone, unspecified site: Secondary | ICD-10-CM | POA: Diagnosis present

## 2018-07-25 DIAGNOSIS — Z91018 Allergy to other foods: Secondary | ICD-10-CM

## 2018-07-25 DIAGNOSIS — Z992 Dependence on renal dialysis: Secondary | ICD-10-CM | POA: Diagnosis not present

## 2018-07-25 DIAGNOSIS — E1122 Type 2 diabetes mellitus with diabetic chronic kidney disease: Secondary | ICD-10-CM | POA: Diagnosis present

## 2018-07-25 DIAGNOSIS — Z7982 Long term (current) use of aspirin: Secondary | ICD-10-CM | POA: Diagnosis not present

## 2018-07-25 DIAGNOSIS — Z79899 Other long term (current) drug therapy: Secondary | ICD-10-CM | POA: Diagnosis not present

## 2018-07-25 DIAGNOSIS — E875 Hyperkalemia: Secondary | ICD-10-CM | POA: Diagnosis present

## 2018-07-25 DIAGNOSIS — T82868A Thrombosis of vascular prosthetic devices, implants and grafts, initial encounter: Secondary | ICD-10-CM

## 2018-07-25 DIAGNOSIS — N186 End stage renal disease: Secondary | ICD-10-CM | POA: Diagnosis present

## 2018-07-25 DIAGNOSIS — Z91048 Other nonmedicinal substance allergy status: Secondary | ICD-10-CM | POA: Diagnosis not present

## 2018-07-25 DIAGNOSIS — D649 Anemia, unspecified: Secondary | ICD-10-CM | POA: Diagnosis not present

## 2018-07-25 DIAGNOSIS — T8249XA Other complication of vascular dialysis catheter, initial encounter: Secondary | ICD-10-CM | POA: Diagnosis present

## 2018-07-25 DIAGNOSIS — I120 Hypertensive chronic kidney disease with stage 5 chronic kidney disease or end stage renal disease: Secondary | ICD-10-CM | POA: Diagnosis present

## 2018-07-25 DIAGNOSIS — I10 Essential (primary) hypertension: Secondary | ICD-10-CM

## 2018-07-25 LAB — BASIC METABOLIC PANEL
Anion gap: 20 — ABNORMAL HIGH (ref 5–15)
BUN: 75 mg/dL — ABNORMAL HIGH (ref 6–20)
CHLORIDE: 102 mmol/L (ref 98–111)
CO2: 22 mmol/L (ref 22–32)
CREATININE: 13.59 mg/dL — AB (ref 0.61–1.24)
Calcium: 8.3 mg/dL — ABNORMAL LOW (ref 8.9–10.3)
GFR calc non Af Amer: 3 mL/min — ABNORMAL LOW (ref >60)
GFR, EST AFRICAN AMERICAN: 4 mL/min — AB (ref >60)
Glucose, Bld: 81 mg/dL (ref 70–99)
POTASSIUM: 5.5 mmol/L — AB (ref 3.5–5.1)
Sodium: 144 mmol/L (ref 135–145)

## 2018-07-25 LAB — CBC
HEMATOCRIT: 36.2 % — AB (ref 39.0–52.0)
HEMOGLOBIN: 10.8 g/dL — AB (ref 13.0–17.0)
MCH: 26.7 pg (ref 26.0–34.0)
MCHC: 29.8 g/dL — AB (ref 30.0–36.0)
MCV: 89.6 fL (ref 78.0–100.0)
Platelets: 297 10*3/uL (ref 150–400)
RBC: 4.04 MIL/uL — ABNORMAL LOW (ref 4.22–5.81)
RDW: 17.2 % — ABNORMAL HIGH (ref 11.5–15.5)
WBC: 5.4 10*3/uL (ref 4.0–10.5)

## 2018-07-25 LAB — GLUCOSE, CAPILLARY
GLUCOSE-CAPILLARY: 81 mg/dL (ref 70–99)
GLUCOSE-CAPILLARY: 169 mg/dL — AB (ref 70–99)

## 2018-07-25 MED ORDER — ACETAMINOPHEN 325 MG PO TABS: 650.0000 mg | ORAL_TABLET | Freq: Four times a day (QID) | ORAL | Status: DC | PRN

## 2018-07-25 MED ORDER — CHLORHEXIDINE GLUCONATE CLOTH 2 % EX PADS: 6.0000 | MEDICATED_PAD | Freq: Every day | CUTANEOUS | Status: DC

## 2018-07-25 MED ORDER — CALCIUM CARBONATE ANTACID 500 MG PO CHEW: 2.0000 | CHEWABLE_TABLET | ORAL | Status: DC | PRN

## 2018-07-25 MED ORDER — VITAMIN D 1000 UNITS PO TABS
2000.0000 [IU] | ORAL_TABLET | Freq: Every day | ORAL | Status: DC
  Administered 2018-07-25: 2000 [IU] via ORAL
  Filled 2018-07-25 (×2): qty 2

## 2018-07-25 MED ORDER — VITAMIN D3 25 MCG (1000 UT) PO CAPS: 2000.0000 [IU] | ORAL_CAPSULE | Freq: Every day | ORAL | Status: DC

## 2018-07-25 MED ORDER — PANTOPRAZOLE SODIUM 40 MG PO TBEC
40.0000 mg | DELAYED_RELEASE_TABLET | Freq: Two times a day (BID) | ORAL | Status: DC
  Administered 2018-07-25: 40 mg via ORAL
  Filled 2018-07-25 (×3): qty 1

## 2018-07-25 MED ORDER — CALCIUM ACETATE (PHOS BINDER) 667 MG PO CAPS: 667.0000 mg | ORAL_CAPSULE | Freq: Three times a day (TID) | ORAL | Status: DC

## 2018-07-25 MED ORDER — ACETAMINOPHEN 650 MG RE SUPP: 650.0000 mg | Freq: Four times a day (QID) | RECTAL | Status: DC | PRN

## 2018-07-25 MED ORDER — AMLODIPINE BESYLATE 10 MG PO TABS
10.0000 mg | ORAL_TABLET | Freq: Every day | ORAL | Status: DC
  Administered 2018-07-25: 10 mg via ORAL
  Filled 2018-07-25 (×2): qty 1

## 2018-07-25 MED ORDER — HEPARIN SODIUM (PORCINE) 5000 UNIT/ML IJ SOLN: 5000.0000 [IU] | Freq: Three times a day (TID) | INTRAMUSCULAR | Status: DC

## 2018-07-25 MED ORDER — ASPIRIN 81 MG PO CHEW
81.0000 mg | CHEWABLE_TABLET | Freq: Every day | ORAL | Status: DC
  Administered 2018-07-25: 81 mg via ORAL
  Filled 2018-07-25 (×2): qty 1

## 2018-07-25 NOTE — Progress Notes (Signed)
Patient arrived on unit via stretcher. No IV was placed before coming from ED. No telemetry orders at this time. Report taken from [place], [place]. Patient alert and oriented x4. Patient belongings with patient. Call bell within reach and will continue to monitor.   Farley Ly RN

## 2018-07-25 NOTE — Consult Note (Signed)
Reason for Consult: To manage dialysis and dialysis related needs Referring Physician: Alecia Lemming, PA-C  Allen Miller is an 62 y.o. male.  HPI: 61AAM with ESRD on HD MWF, HTN, h/o CVA who presents to the [facility] ED with clotted dialysis access.  He was last dialyzed 6 days ago.  Presented to HD last week with clotted AVG and had successful thrombectomy last week at [facility].  Unfortunately when he presented for dialysis he had re-thrombosed.  He currently denies any significant symptoms.    Dialyzes at [facility]  --> orders not currently available, will be placed in addendum  Past Medical History:  Diagnosis Date  . Cancer [facility])    kidney cancer  . Coffee ground emesis 11/2017  . ESRD (end stage renal disease) ([place])    MWF  . Hemodialysis patient ([place])   . Hypertension   . Low iron   . Shortness of breath dyspnea    when lying flat  . Stroke [facility])    effective vision- Left eye    Past Surgical History:  Procedure Laterality Date  . A/V FISTULAGRAM Right 06/25/2017   Procedure: A/V Fistulagram;  Surgeon: Elam Dutch, MD;  Location: [place] CV LAB;  Service: Cardiovascular;  Laterality: Right;  . AV FISTULA PLACEMENT Left 10/27/2016   Procedure: INSERTION LEFT UPPER ARM  OF ARTERIOVENOUS (AV) GORE-TEX GRAFT ARM;  Surgeon: Conrad [place], MD;  Location: [place];  Service: Vascular;  Laterality: Left;  . AV FISTULA PLACEMENT Right 03/23/2017   Procedure: ARTERIOVENOUS (AV) FISTULA CREATION;  Surgeon: Elam Dutch, MD;  Location: [place];  Service: Vascular;  Laterality: Right;  . BASCILIC VEIN TRANSPOSITION Left 02/24/2016   Procedure: BASILIC VEIN TRANSPOSITION VERSUS ARTERIOVENOUS GRAFT INSERTION;  Surgeon: [name], MD;  Location: [place];  Service: Vascular;  Laterality: Left;  . BASCILIC VEIN TRANSPOSITION Left 07/14/2016   Procedure: LEFT BASILIC VEIN TRANSPOSITION;  Surgeon: Conrad [place], MD;  Location: [place];  Service: Vascular;  Laterality: Left;  . BASCILIC VEIN  TRANSPOSITION Left 09/08/2016   Procedure: SECOND STAGE BASILIC VEIN TRANSPOSITION LEFT UPPER ARM;  Surgeon: Conrad McGuffey, MD;  Location: [place];  Service: Vascular;  Laterality: Left;  . ESOPHAGOGASTRODUODENOSCOPY N/A 12/26/2017   Procedure: ESOPHAGOGASTRODUODENOSCOPY (EGD);  Surgeon: Ladene Artist, MD;  Location: [facility] ENDOSCOPY;  Service: Endoscopy;  Laterality: N/A;  . INSERTION OF DIALYSIS CATHETER N/A 02/24/2016   Procedure: INSERTION OF DIALYSIS CATHETER;  Surgeon: [name], MD;  Location: [place];  Service: Vascular;  Laterality: N/A;  . INSERTION OF DIALYSIS CATHETER Right 10/27/2016   Procedure: EXCHANGE OF RIGHT INTERNAL JUGULAR DIALYSIS CATHETER;  Surgeon: Conrad [place], MD;  Location: [place];  Service: Vascular;  Laterality: Right;  . PERIPHERAL VASCULAR BALLOON ANGIOPLASTY Right 06/25/2017   Procedure: Peripheral Vascular Balloon Angioplasty;  Surgeon: Elam Dutch, MD;  Location: [place] CV LAB;  Service: Cardiovascular;  Laterality: Right;  . THROMBECTOMY AND REVISION OF ARTERIOVENTOUS (AV) GORETEX  GRAFT Left 03/02/2017   Procedure: THROMBECTOMY AND REVISION OF ARTERIOVENous left arm GORETEX  GRAFT;  Surgeon: Elam Dutch, MD;  Location: [place];  Service: Vascular;  Laterality: Left;  . THROMBECTOMY W/ EMBOLECTOMY Left 12/14/2016   Procedure: THROMBECTOMY OF LEFT  ARTERIO-VENOUS GORTEX GRAFT; REVISION OF LEFT  ARTERIO-VENOUS GORE-TEX GRAFT;  Surgeon: [name], MD;  Location: [place];  Service: Vascular;  Laterality: Left;    History reviewed. No pertinent family history.  Social History:  reports  that he has never smoked. He has never used smokeless tobacco. He reports that he does not drink alcohol or use drugs.  Allergies:  Allergies  Allergen Reactions  . Pork-Derived Products Other (See Comments)    NOT AN ALLERGY > RELIGIOUS REASONS  . Ppd [Tuberculin Purified Protein Derivative] Rash    Medications: I have reviewed the patient's current medications. *orders  not currently available, will be placed in addendum  Results for orders placed or performed during the hospital encounter of 07/25/18 (from the past 48 hour(s))  CBC     Status: Abnormal   Collection Time: 07/25/18 11:58 AM  Result Value Ref Range   WBC 5.4 4.0 - 10.5 K/uL   RBC 4.04 (L) 4.22 - 5.81 MIL/uL   Hemoglobin 10.8 (L) 13.0 - 17.0 g/dL   HCT 36.2 (L) 39.0 - 52.0 %   MCV 89.6 78.0 - 100.0 fL   MCH 26.7 26.0 - 34.0 pg   MCHC 29.8 (L) 30.0 - 36.0 g/dL   RDW 17.2 (H) 11.5 - 15.5 %   Platelets 297 150 - 400 K/uL    Comment: Performed at [facility], [place] [address] [place], [place] [address]  Basic metabolic panel     Status: Abnormal   Collection Time: 07/25/18 11:58 AM  Result Value Ref Range   Sodium 144 135 - 145 mmol/L   Potassium 5.5 (H) 3.5 - 5.1 mmol/L   Chloride 102 98 - 111 mmol/L   CO2 22 22 - 32 mmol/L   Glucose, Bld 81 70 - 99 mg/dL   BUN 75 (H) 6 - 20 mg/dL   Creatinine, Ser 13.59 (H) 0.61 - 1.24 mg/dL   Calcium 8.3 (L) 8.9 - 10.3 mg/dL   GFR calc non Af Amer 3 (L) >60 mL/min   GFR calc Af Amer 4 (L) >60 mL/min    Comment: (NOTE) The eGFR has been calculated using the CKD EPI equation. This calculation has not been validated in all clinical situations. eGFR's persistently <60 mL/min signify possible Chronic Kidney Disease.    Anion gap 20 (H) 5 - 15    Comment: Performed at [facility], [place] [address] [place], [place] [address]    No results found.  ROS: 10 system ROS negative except per HPI listed above.   Blood pressure (!) 189/87, pulse 80, temperature 98.4 F (36.9 C), temperature source Oral, resp. rate 16, SpO2 100 %. Gen: appears well ENT: MMM Neck: supple CV: RRR, no rub Lungs: normal WOB, clear Extr: no edema, RUE AVG no thrill or bruit Neuro: nonfocal  Assessment/Plan: 1. ESRD: Clotted access; no emergency indications for dialysis but last treatment was going on 1 week ago.  Admit to hospitalist for ideally thrombectomy  followed by HD, however logistically may need catheter placed as temporizing measure.  Asked ED to consult with IR regarding procedure timing for today.  3 Hypertension:  Asymptomatic HTN currently. Likely volume related at least in part. Monitor during admission, particularly after UF.  4. Anemia of ESRD: Hb reasonable at 10.8.  Will obtain outpt orders for ESA and recent iron indices.   5. Metabolic Bone Disease: Ca ok.  Can continue home meds here including binder.  Will obtain outpatient orders for VDRA, etc.  6.  Mild hyperkalemia:  Will correct with HD.  Low K diet.   Jannifer Hick A 07/25/2018, 2:01 PM

## 2018-07-25 NOTE — ED Notes (Signed)
Attempted to start IV - unsuccessful - order placed for IV team consult

## 2018-07-25 NOTE — ED Notes (Signed)
Restrictions band placed on pt's right wrist. 

## 2018-07-25 NOTE — Progress Notes (Signed)
Chief Complaint: Patient was seen in consultation today for clotted (R)UE AVF at the request of Dr. Jannifer Hick  Referring Physician(s): Dr. Jannifer Hick  Supervising Physician: Corrie Mckusick  Patient Status: [facility] - In-pt  History of Present Illness: Allen Miller is a 62 y.o. male with ESRD. He gets HD via (R)UE brachial-cephalic AVF created by Dr. Oneida Alar in 02/2017. Had to have balloon angioplasty in 05/2017 but otherwise AVF has functioned well. Last HD was about 6 days ago, AVF found to be thrombosed. Went to outpt CKD center and had successful declot procedure. However, upon arrival for subsequent HD sessions, AVF was again found to be thrombosed. He was sent to the ER and is to be admitted. Nephrology has seen pt and requests IR to attempt declot procedure again. Pt K is 5.5 and per Nephrology note, no emergent dialysis needs today. PMHx, meds, labs, imaging, allergies reviewed. Feels well, no recent fevers, chills, illness.   Past Medical History:  Diagnosis Date  . Cancer [facility])    kidney cancer  . Coffee ground emesis 11/2017  . ESRD (end stage renal disease) ([place])    MWF  . Hemodialysis patient ([place])   . Hypertension   . Low iron   . Shortness of breath dyspnea    when lying flat  . Stroke [facility])    effective vision- Left eye    Past Surgical History:  Procedure Laterality Date  . A/V FISTULAGRAM Right 06/25/2017   Procedure: A/V Fistulagram;  Surgeon: Elam Dutch, MD;  Location: [place] CV LAB;  Service: Cardiovascular;  Laterality: Right;  . AV FISTULA PLACEMENT Left 10/27/2016   Procedure: INSERTION LEFT UPPER ARM  OF ARTERIOVENOUS (AV) GORE-TEX GRAFT ARM;  Surgeon: Conrad [place], MD;  Location: [place];  Service: Vascular;  Laterality: Left;  . AV FISTULA PLACEMENT Right 03/23/2017   Procedure: ARTERIOVENOUS (AV) FISTULA CREATION;  Surgeon: Elam Dutch, MD;  Location: [place];  Service: Vascular;  Laterality: Right;  . BASCILIC VEIN TRANSPOSITION  Left 02/24/2016   Procedure: BASILIC VEIN TRANSPOSITION VERSUS ARTERIOVENOUS GRAFT INSERTION;  Surgeon: [name], MD;  Location: [place];  Service: Vascular;  Laterality: Left;  . BASCILIC VEIN TRANSPOSITION Left 07/14/2016   Procedure: LEFT BASILIC VEIN TRANSPOSITION;  Surgeon: Conrad [place], MD;  Location: [place];  Service: Vascular;  Laterality: Left;  . BASCILIC VEIN TRANSPOSITION Left 09/08/2016   Procedure: SECOND STAGE BASILIC VEIN TRANSPOSITION LEFT UPPER ARM;  Surgeon: Conrad [place], MD;  Location: [place];  Service: Vascular;  Laterality: Left;  . ESOPHAGOGASTRODUODENOSCOPY N/A 12/26/2017   Procedure: ESOPHAGOGASTRODUODENOSCOPY (EGD);  Surgeon: Ladene Artist, MD;  Location: [facility] ENDOSCOPY;  Service: Endoscopy;  Laterality: N/A;  . INSERTION OF DIALYSIS CATHETER N/A 02/24/2016   Procedure: INSERTION OF DIALYSIS CATHETER;  Surgeon: [name], MD;  Location: [place];  Service: Vascular;  Laterality: N/A;  . INSERTION OF DIALYSIS CATHETER Right 10/27/2016   Procedure: EXCHANGE OF RIGHT INTERNAL JUGULAR DIALYSIS CATHETER;  Surgeon: Conrad [place], MD;  Location: [place];  Service: Vascular;  Laterality: Right;  . PERIPHERAL VASCULAR BALLOON ANGIOPLASTY Right 06/25/2017   Procedure: Peripheral Vascular Balloon Angioplasty;  Surgeon: Elam Dutch, MD;  Location: [place] CV LAB;  Service: Cardiovascular;  Laterality: Right;  . THROMBECTOMY AND REVISION OF ARTERIOVENTOUS (AV) GORETEX  GRAFT Left 03/02/2017   Procedure: THROMBECTOMY AND REVISION OF ARTERIOVENous left arm GORETEX  GRAFT;  Surgeon: Elam Dutch, MD;  Location: [place];  Service: Vascular;  Laterality: Left;  . THROMBECTOMY W/ EMBOLECTOMY Left 12/14/2016   Procedure: THROMBECTOMY OF LEFT  ARTERIO-VENOUS GORTEX GRAFT; REVISION OF LEFT  ARTERIO-VENOUS GORE-TEX GRAFT;  Surgeon: [name], MD;  Location: [place];  Service: Vascular;  Laterality: Left;    Allergies: Pork-derived products and Ppd [tuberculin purified protein  derivative]  Medications: Prior to Admission medications   Medication Sig Start Date End Date Taking? Authorizing Provider  acetaminophen (TYLENOL) 325 MG tablet Take 2 tablets (650 mg total) by mouth every 6 (six) hours as needed for mild pain, fever or headache. 12/24/17  Yes Mariel Aloe, MD  aspirin 81 MG chewable tablet Chew 81 mg by mouth daily.    Yes [provider]  calcitRIOL (ROCALTROL) 0.5 MCG capsule Take 1 capsule (0.5 mcg total) by mouth every Monday, Wednesday, and Friday with hemodialysis. Patient taking differently: Take 0.5 mcg by mouth Every Tuesday,Thursday,and Saturday with dialysis.  12/27/17  Yes Mariel Aloe, MD  calcium acetate (PHOSLO) 667 MG capsule Take 667 mg by mouth 3 (three) times daily. 03/02/16  Yes [provider]  calcium carbonate (TUMS EX) 750 MG chewable tablet Chew 2 tablets by mouth as needed for heartburn.   Yes [provider]  Cholecalciferol (VITAMIN D3) 1000 units CAPS Take 2,000 Units by mouth daily.   Yes [provider]  ondansetron (ZOFRAN ODT) 4 MG disintegrating tablet Take 1 tablet (4 mg total) by mouth every 8 (eight) hours as needed for nausea or vomiting. 12/26/17  Yes Mariel Aloe, MD  pantoprazole (PROTONIX) 40 MG tablet Take 1 tablet (40 mg total) by mouth 2 (two) times daily. 12/26/17  Yes Mariel Aloe, MD     History reviewed. No pertinent family history.  Social History   Socioeconomic History  . Marital status: Single    Spouse name: Not on file  . Number of children: Not on file  . Years of education: Not on file  . Highest education level: Not on file  Occupational History  . Not on file  Social Needs  . Financial resource strain: Not on file  . Food insecurity:    Worry: Not on file    Inability: Not on file  . Transportation needs:    Medical: Not on file    Non-medical: Not on file  Tobacco Use  . Smoking status: Never Smoker  . Smokeless tobacco: Never Used  Substance  and Sexual Activity  . Alcohol use: No  . Drug use: No  . Sexual activity: Not on file  Lifestyle  . Physical activity:    Days per week: Not on file    Minutes per session: Not on file  . Stress: Not on file  Relationships  . Social connections:    Talks on phone: Not on file    Gets together: Not on file    Attends religious service: Not on file    Active member of club or organization: Not on file    Attends meetings of clubs or organizations: Not on file    Relationship status: Not on file  Other Topics Concern  . Not on file  Social History Narrative  . Not on file     Review of Systems: A 12 point ROS discussed and pertinent positives are indicated in the HPI above.  All other systems are negative.  Review of Systems  Vital Signs: BP (!) 189/87 (BP Location: Left Arm)   Pulse 80   Temp 98.4  F (36.9 C) (Oral)   Resp 16   SpO2 100%   Physical Exam  Constitutional: He is oriented to person, place, and time. He appears well-developed. No distress.  HENT:  Head: Normocephalic.  Mouth/Throat: Oropharynx is clear and moist.  Neck: Normal range of motion. No JVD present. No tracheal deviation present.  Cardiovascular: Normal rate, regular rhythm and normal heart sounds.  Pulmonary/Chest: Effort normal and breath sounds normal. No respiratory distress.  Musculoskeletal:  (R)UE AVF noted without palpable pulse/thrill Hand warm, good radial pulse  Neurological: He is alert and oriented to person, place, and time.  Skin: Skin is warm and dry.    Imaging: No results found.  Labs:  CBC: Recent Labs    12/22/17 0322 12/23/17 0453 12/24/17 0756 07/25/18 1158  WBC 12.5* 9.2 8.1 5.4  HGB 9.9* 10.1* 9.8* 10.8*  HCT 30.3* 32.3* 30.6* 36.2*  PLT 321 277 282 297    COAGS: Recent Labs    12/21/17 2120  INR 1.13  APTT 30    BMP: Recent Labs    12/21/17 1744 12/22/17 0322 12/24/17 0756 07/25/18 1158  NA 136 139 137 144  K 3.7 4.0 3.8 5.5*  CL 91*  93* 95* 102  CO2 22 25 25 22   GLUCOSE 260* 210* 131* 81  BUN 69* 72* 57* 75*  CALCIUM 8.6* 8.1* 8.4* 8.3*  CREATININE 15.71* 15.17* 11.22* 13.59*  GFRNONAA 3* 3* 4* 3*  GFRAA 3* 3* 5* 4*    LIVER FUNCTION TESTS: Recent Labs    12/21/17 1744 12/24/17 0756  BILITOT 0.7  --   AST 17  --   ALT 17  --   ALKPHOS 153*  --   PROT 8.9*  --   ALBUMIN 4.0 3.2*    TUMOR MARKERS: No results for input(s): AFPTM, CEA, CA199, CHROMGRNA in the last 8760 hours.  Assessment and Plan: ESRD on HD Clotted (R)UE AVF Discussed thrombolysis/thrombectomy of AV Graft/Fistula, possible angioplasty, possible stent, possible HD catheter placement if necessary. Risks, benefits, use of sedation thoroughly explained. Pt to be admitted. Plan for procedure tomorrow.  Thank you for this interesting consult.  I greatly enjoyed meeting Krew Hortman and look forward to participating in their care.  A copy of this report was sent to the requesting provider on this date.  Electronically Signed: Ascencion Dike, PA-C 07/25/2018, 3:08 PM   I spent a total of 25 minutes in face to face in clinical consultation, greater than 50% of which was counseling/coordinating care for AVF declot

## 2018-07-25 NOTE — H&P (Signed)
Date: 07/25/2018               Patient Name:  Allen Miller MRN: [id number]  DOB: January 19, 1956 Age / Sex: 62 y.o., male   PCP: [facility], [place] Service: Internal Medicine Teaching Service         Attending Physician: Dr. Oval Linsey    First Contact: Dr. Laural Golden, [place] Pager: [phone]  Second Contact: Dr. 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 He presented to HD last week and was informed his right arm graft was clotted with no vascular access for HD. He underwent a successful thrombectomy on 8/22 but unfortunately re-thrombosed when he returned for HD. His last HD session was about a week ago. He reported similar issues with his left arm graft in the past, with three failed attempts. He has been seen by [facility] vascular in the past. He has never used heparin with HD due to religious reasons regarding pork in heparin. No other anticoagulation options have ever been discussed with him. He had a PD catheter for about 3-4 months due to failed HD attempts with the left arm AV graft. The PD catheter was discontinued when he presented to the ED with hematemesis in 11/2017 and a right arm AV graft was utilized for HD. He denies any SOB, chest pain, abdominal pain, N/V, lower extremity swelling, or trouble/decreased urinating.   He denied any family history of kidney disease. He reported he was diagnosed with diabetes many years ago and associates his kidney damage to medications he was started on for his diabetes. He is currently not on any diabetes medications. He denied any residual neuro deficits from his hx of stroke except for changes in his vision.   In the ED,  patient was hypertensive with otherwise stable vitals and euvolemic on exam. He was found to have mild hyperkalemia, 5.5. He was admitted for thrombectomy vs tunneled catheter followed by HD.    Meds:  Current Meds  Medication Sig  . acetaminophen (TYLENOL) 325 MG tablet Take 2 tablets (650 mg total) by mouth every 6 (six) hours as needed for mild pain, fever or headache.  Marland Kitchen aspirin 81 MG chewable tablet Chew 81 mg by mouth daily.   . calcitRIOL (ROCALTROL) 0.5 MCG capsule Take 1 capsule (0.5 mcg total) by mouth every Monday, Wednesday, and Friday with hemodialysis. (Patient taking differently: Take 0.5 mcg by mouth Every Tuesday,Thursday,and Saturday with dialysis. )  . calcium acetate (PHOSLO) 667 MG capsule Take 667 mg by mouth 3 (three) times daily.  . calcium carbonate (TUMS EX) 750 MG chewable tablet Chew 2 tablets by mouth as needed for heartburn.  . Cholecalciferol (VITAMIN D3) 1000 units CAPS Take 2,000 Units by mouth daily.  . ondansetron (ZOFRAN ODT) 4 MG disintegrating tablet Take 1 tablet (4 mg total) by mouth every 8 (eight) hours as needed for nausea or vomiting.  . pantoprazole (PROTONIX) 40 MG tablet Take 1 tablet (40 mg total) by mouth 2 (two) times daily.     Allergies: Allergies as of 07/25/2018 - Review Complete 07/25/2018  Allergen Reaction Noted  .  Pork-derived products Other (See Comments) 12/14/2016  . Ppd [tuberculin purified protein derivative] Rash 12/04/2016   Past Medical History:  Diagnosis Date  . Cancer [facility])    kidney cancer  . Coffee ground emesis 11/2017  . ESRD (end stage renal disease) ([place])    MWF  . Hemodialysis patient ([place])   . Hypertension   . Low iron   . Shortness of breath dyspnea    when lying flat  . Stroke [facility])    effective vision- Left eye    Family History:  History reviewed. No pertinent family history.  Social History:  Social History   Socioeconomic History  . Marital status: Single    Spouse name: Not on file  .  Number of children: Not on file  . Years of education: Not on file  . Highest education level: Not on file  Occupational History  . Not on file  Social Needs  . Financial resource strain: Not on file  . Food insecurity:    Worry: Not on file    Inability: Not on file  . Transportation needs:    Medical: Not on file    Non-medical: Not on file  Tobacco Use  . Smoking status: Never Smoker  . Smokeless tobacco: Never Used  Substance and Sexual Activity  . Alcohol use: No  . Drug use: No  . Sexual activity: Not on file  Lifestyle  . Physical activity:    Days per week: Not on file    Minutes per session: Not on file  . Stress: Not on file  Relationships  . Social connections:    Talks on phone: Not on file    Gets together: Not on file    Attends religious service: Not on file    Active member of club or organization: Not on file    Attends meetings of clubs or organizations: Not on file    Relationship status: Not on file  . Intimate partner violence:    Fear of current or ex partner: Not on file    Emotionally abused: Not on file    Physically abused: Not on file    Forced sexual activity: Not on file  Other Topics Concern  . Not on file  Social History Narrative  . Not on file    Review of Systems: A complete ROS was negative except as per HPI.   Physical Exam: Blood pressure (!) 189/87, pulse 80, temperature 98.4 F (36.9 C), temperature source Oral, resp. rate 16, SpO2 100 %.  Physical Exam  Constitutional: He is oriented to person, place, and time and well-developed, well-nourished, and in no distress.  Cardiovascular: Normal rate, regular rhythm and normal heart sounds.  No murmur heard. Pulmonary/Chest: Effort normal and breath sounds normal. No respiratory distress. He has no wheezes. He has no rales.  Abdominal: Soft. Bowel sounds are normal. He exhibits no distension. There is no tenderness.  Musculoskeletal: He exhibits no edema.  Neurological: He is  alert and oriented to person, place, and time.  Skin: Skin is warm and dry.    EKG: pending  CXR: n/a  Assessment & Plan by Problem: Principal Problem:   Clotted dialysis access [facility]) Active Problems:   ESRD on dialysis [facility])   Hyperkalemia  Mr. Oquinn is a 62 y.o male with a PMHx of stroke, type II DM, and ESRD on HD presenting with clotted dialysis access of his right arm AV graft after a thrombectomy last week.   Clotted dialysis access  ESRD on dialysis: Patient has never used heparin injections with HD due to religious reasons regarding pork in heparin. No other anticoagulation options have been discussed with him. He has a history of left arm AV graft clotting with three failed attempts.  Patient had a PD catheter for 3-4 months due to failed HD; it was  discontinued due to hematemesis in 11/2017. Right arm AV graft was utilized for HD shortly after. He had a thrombectomy last week on 8/22 but unfortunately re-clotted the next day when he returned for HD. Nephrology has been consulted and there is no emergent indication for HD. Recommending thrombectomy vs tunneled catheter followed by HD. - f/u with IR - discuss anticoagulation options with nephro - Hgb stable at 10.8  Hyperkalemia: Patient presented with hyperkalemia, 5.5, in the setting of missed HD for the past week. He is currently not having any symptoms and producing urine. Will follow closely after HD. - f/u bmet  Hypertension:  Patient presented hypertensive, 189/87. Currently asymptomatic. Denies any home antihypertensive medications. Most likely volume related in the setting of missed HD. - monitor vitals  - amlodipine 10 mg   DM: Patient said he was falsely diagnosed with DM in the past and diabetes medications resulted in kidney disease. Currently not on any diabetes medications.  -f/u A1c    Dispo: Admit patient to Observation with expected length of stay less than 2 midnights.  SignedMike Craze,  DO 07/25/2018, 3:03 PM  Pager: [phone]

## 2018-07-25 NOTE — ED Provider Notes (Signed)
[facility] EMERGENCY DEPARTMENT Provider Note   CSN: [id number] Arrival date & time: 07/25/18  1003     History   Chief Complaint Chief Complaint  Patient presents with  . Vascular Access Problem    HPI Allen Miller is a 62 y.o. male.  Patient with history of end-stage renal disease on hemodialysis with graft in the right upper extremity presents to the emergency department today in need of dialysis.  Patient states that his graft is clogged.  Last dialysis session was 6 days ago.  Patient reports no current symptoms.  States that he had several procedures to the cause of the graft last week which were unsuccessful.  He states he was told to come to the [facility] emergency department and that they "sent papers" over earlier.  Patient dialyzes at the [facility] but does not know the name of his nephrologist. Graft was placed by [facility]. The onset of this condition was acute. The course is constant. Aggravating factors: none. Alleviating factors: none.       Past Medical History:  Diagnosis Date  . Cancer [facility])    kidney cancer  . Coffee ground emesis 11/2017  . ESRD (end stage renal disease) ([place])    MWF  . Hemodialysis patient ([place])   . Hypertension   . Low iron   . Shortness of breath dyspnea    when lying flat  . Stroke [facility])    effective vision- Left eye    Patient Active Problem List   Diagnosis Date Noted  . GERD with esophagitis 12/26/2017  . Dysphagia   . Non-intractable vomiting with nausea   . Coffee ground emesis 12/21/2017  . Chest pain 12/21/2017  . Hyperkalemia 08/25/2016  . Acute CVA (cerebrovascular accident) ([place]) 01/24/2016  . Hypertensive emergency 01/21/2016  . History of CVA (cerebrovascular accident) 01/21/2016  . Visual disturbance 01/21/2016  . Microcytic anemia 01/21/2016  . ESRD on dialysis ([place]) 01/21/2016  . Blurred vision, left eye     Past Surgical History:  Procedure Laterality Date  . A/V  FISTULAGRAM Right 06/25/2017   Procedure: A/V Fistulagram;  Surgeon: Elam Dutch, MD;  Location: [place] CV LAB;  Service: Cardiovascular;  Laterality: Right;  . AV FISTULA PLACEMENT Left 10/27/2016   Procedure: INSERTION LEFT UPPER ARM  OF ARTERIOVENOUS (AV) GORE-TEX GRAFT ARM;  Surgeon: Conrad [place], MD;  Location: [place];  Service: Vascular;  Laterality: Left;  . AV FISTULA PLACEMENT Right 03/23/2017   Procedure: ARTERIOVENOUS (AV) FISTULA CREATION;  Surgeon: Elam Dutch, MD;  Location: [place];  Service: Vascular;  Laterality: Right;  . BASCILIC VEIN TRANSPOSITION Left 02/24/2016   Procedure: BASILIC VEIN TRANSPOSITION VERSUS ARTERIOVENOUS GRAFT INSERTION;  Surgeon: [name], MD;  Location: [place];  Service: Vascular;  Laterality: Left;  . BASCILIC VEIN TRANSPOSITION Left 07/14/2016   Procedure: LEFT BASILIC VEIN TRANSPOSITION;  Surgeon: Conrad Shonto, MD;  Location: [place];  Service: Vascular;  Laterality: Left;  . BASCILIC VEIN TRANSPOSITION Left 09/08/2016   Procedure: SECOND STAGE BASILIC VEIN TRANSPOSITION LEFT UPPER ARM;  Surgeon: Conrad , MD;  Location: [place];  Service: Vascular;  Laterality: Left;  . ESOPHAGOGASTRODUODENOSCOPY N/A 12/26/2017   Procedure: ESOPHAGOGASTRODUODENOSCOPY (EGD);  Surgeon: Ladene Artist, MD;  Location: [facility] ENDOSCOPY;  Service: Endoscopy;  Laterality: N/A;  . INSERTION OF DIALYSIS CATHETER N/A 02/24/2016   Procedure: INSERTION OF DIALYSIS CATHETER;  Surgeon: [name], MD;  Location: [place];  Service:  Vascular;  Laterality: N/A;  . INSERTION OF DIALYSIS CATHETER Right 10/27/2016   Procedure: EXCHANGE OF RIGHT INTERNAL JUGULAR DIALYSIS CATHETER;  Surgeon: Conrad Dauphin, MD;  Location: [place];  Service: Vascular;  Laterality: Right;  . PERIPHERAL VASCULAR BALLOON ANGIOPLASTY Right 06/25/2017   Procedure: Peripheral Vascular Balloon Angioplasty;  Surgeon: Elam Dutch, MD;  Location: [place] CV LAB;  Service: Cardiovascular;  Laterality:  Right;  . THROMBECTOMY AND REVISION OF ARTERIOVENTOUS (AV) GORETEX  GRAFT Left 03/02/2017   Procedure: THROMBECTOMY AND REVISION OF ARTERIOVENous left arm GORETEX  GRAFT;  Surgeon: Elam Dutch, MD;  Location: [place];  Service: Vascular;  Laterality: Left;  . THROMBECTOMY W/ EMBOLECTOMY Left 12/14/2016   Procedure: THROMBECTOMY OF LEFT  ARTERIO-VENOUS GORTEX GRAFT; REVISION OF LEFT  ARTERIO-VENOUS GORE-TEX GRAFT;  Surgeon: [name], MD;  Location: [place];  Service: Vascular;  Laterality: Left;        Home Medications    Prior to Admission medications   Medication Sig Start Date End Date Taking? Authorizing Provider  acetaminophen (TYLENOL) 325 MG tablet Take 2 tablets (650 mg total) by mouth every 6 (six) hours as needed for mild pain, fever or headache. 12/24/17   Mariel Aloe, MD  aspirin 81 MG chewable tablet Chew 81 mg by mouth daily.     [provider]  calcitRIOL (ROCALTROL) 0.5 MCG capsule Take 1 capsule (0.5 mcg total) by mouth every Monday, Wednesday, and Friday with hemodialysis. 12/27/17   Mariel Aloe, MD  calcium acetate (PHOSLO) 667 MG capsule Take 667 mg by mouth 3 (three) times daily. 03/02/16   [provider]  Cholecalciferol (VITAMIN D3) 1000 units CAPS Take 2,000 Units by mouth daily.    [provider]  ondansetron (ZOFRAN ODT) 4 MG disintegrating tablet Take 1 tablet (4 mg total) by mouth every 8 (eight) hours as needed for nausea or vomiting. 12/26/17   Mariel Aloe, MD  pantoprazole (PROTONIX) 40 MG tablet Take 1 tablet (40 mg total) by mouth 2 (two) times daily. 12/26/17   Mariel Aloe, MD    Family History History reviewed. No pertinent family history.  Social History Social History   Tobacco Use  . Smoking status: Never Smoker  . Smokeless tobacco: Never Used  Substance Use Topics  . Alcohol use: No  . Drug use: No     Allergies   Pork-derived products and Ppd [tuberculin purified protein derivative]   Review of  Systems Review of Systems  Constitutional: Negative for fever.  HENT: Negative for rhinorrhea and sore throat.   Eyes: Negative for redness.  Respiratory: Negative for cough.   Cardiovascular: Negative for chest pain.  Gastrointestinal: Negative for abdominal pain, diarrhea, nausea and vomiting.  Genitourinary: Negative for dysuria.  Musculoskeletal: Negative for myalgias.  Skin: Negative for rash.  Neurological: Negative for headaches.     Physical Exam Updated Vital Signs BP (!) 189/87 (BP Location: Left Arm)   Pulse 80   Temp 98.4 F (36.9 C) (Oral)   Resp 16   SpO2 100%   Physical Exam  Constitutional: He appears well-developed and well-nourished.  HENT:  Head: Normocephalic and atraumatic.  Eyes: Conjunctivae are normal. Right eye exhibits no discharge. Left eye exhibits no discharge.  Neck: Normal range of motion. Neck supple.  Cardiovascular: Normal rate, regular rhythm and normal heart sounds.  Pulmonary/Chest: Effort normal and breath sounds normal.  Abdominal: Soft. There is no tenderness.  Musculoskeletal:  Palpable graft in the  R upper extremity.   Neurological: He is alert.  Skin: Skin is warm and dry.  Psychiatric: He has a normal mood and affect.  Nursing note and vitals reviewed.    ED Treatments / Results  Labs (all labs ordered are listed, but only abnormal results are displayed) Labs Reviewed  CBC - Abnormal; Notable for the following components:      Result Value   RBC 4.04 (*)    Hemoglobin 10.8 (*)    HCT 36.2 (*)    MCHC 29.8 (*)    RDW 17.2 (*)    All other components within normal limits  BASIC METABOLIC PANEL - Abnormal; Notable for the following components:   Potassium 5.5 (*)    BUN 75 (*)    Creatinine, Ser 13.59 (*)    Calcium 8.3 (*)    GFR calc non Af Amer 3 (*)    GFR calc Af Amer 4 (*)    Anion gap 20 (*)    All other components within normal limits  I-STAT CHEM 8, ED    EKG None  Radiology No results  found.  Procedures Procedures (including critical care time)  Medications Ordered in ED Medications - No data to display   Initial Impression / Assessment and Plan / ED Course  I have reviewed the triage vital signs and the nursing notes.  Pertinent labs & imaging results that were available during my care of the patient were reviewed by me and considered in my medical decision making (see chart for details).     Patient seen and examined. Work-up initiated.   Vital signs reviewed and are as follows: BP (!) 189/87 (BP Location: Left Arm)   Pulse 80   Temp 98.4 F (36.9 C) (Oral)   Resp 16   SpO2 100%   Spoke with nephrology who will help with plan for dispo. Labs pending.   1:56 PM I spoke with Dr. Johnney Ou who has now seen the patient.   Requests IR consult with hospitalist admission for access solution and inpt dialysis.   2:03 PM Spoke with Dr. Earleen Newport, interventional. IR will see patient. Will request inpatient admission.   2:13 PM IMTS admitting.    Final Clinical Impressions(s) / ED Diagnoses   Final diagnoses:  Hyperkalemia  ESRD (end stage renal disease) ([place])  Thrombosis of kidney dialysis arteriovenous graft, initial encounter ([place])   Admit.   ED Discharge Orders    None       Carlisle Cater, [place] 07/25/18 1611    Tegeler, Gwenyth Allegra, MD 07/25/18 1640

## 2018-07-25 NOTE — ED Triage Notes (Signed)
Pt states he needs dialysis and his fistula is clogged. States he has not had a full treatment since last Tuesday. No distress noted.

## 2018-07-25 NOTE — ED Notes (Signed)
Report called to Mickel Baas, RN - ready to accept pt

## 2018-07-26 ENCOUNTER — Inpatient Hospital Stay (HOSPITAL_COMMUNITY): Payer: BLUE CROSS/BLUE SHIELD

## 2018-07-26 ENCOUNTER — Encounter (HOSPITAL_COMMUNITY): Payer: Self-pay | Admitting: Interventional Radiology

## 2018-07-26 DIAGNOSIS — H539 Unspecified visual disturbance: Secondary | ICD-10-CM

## 2018-07-26 DIAGNOSIS — I120 Hypertensive chronic kidney disease with stage 5 chronic kidney disease or end stage renal disease: Secondary | ICD-10-CM

## 2018-07-26 DIAGNOSIS — Z992 Dependence on renal dialysis: Secondary | ICD-10-CM

## 2018-07-26 DIAGNOSIS — N186 End stage renal disease: Secondary | ICD-10-CM

## 2018-07-26 DIAGNOSIS — E1122 Type 2 diabetes mellitus with diabetic chronic kidney disease: Secondary | ICD-10-CM

## 2018-07-26 DIAGNOSIS — Z887 Allergy status to serum and vaccine status: Secondary | ICD-10-CM

## 2018-07-26 DIAGNOSIS — I10 Essential (primary) hypertension: Secondary | ICD-10-CM

## 2018-07-26 DIAGNOSIS — T82868A Thrombosis of vascular prosthetic devices, implants and grafts, initial encounter: Principal | ICD-10-CM

## 2018-07-26 DIAGNOSIS — Z79899 Other long term (current) drug therapy: Secondary | ICD-10-CM

## 2018-07-26 DIAGNOSIS — D649 Anemia, unspecified: Secondary | ICD-10-CM

## 2018-07-26 DIAGNOSIS — E875 Hyperkalemia: Secondary | ICD-10-CM

## 2018-07-26 DIAGNOSIS — Z91018 Allergy to other foods: Secondary | ICD-10-CM

## 2018-07-26 DIAGNOSIS — I69398 Other sequelae of cerebral infarction: Secondary | ICD-10-CM

## 2018-07-26 HISTORY — PX: IR FLUORO GUIDE CV LINE RIGHT: IMG2283

## 2018-07-26 HISTORY — PX: IR US GUIDE VASC ACCESS RIGHT: IMG2390

## 2018-07-26 LAB — CBC
HCT: 27.3 % — ABNORMAL LOW (ref 39.0–52.0)
Hemoglobin: 8.4 g/dL — ABNORMAL LOW (ref 13.0–17.0)
MCH: 26.8 pg (ref 26.0–34.0)
MCHC: 30.8 g/dL (ref 30.0–36.0)
MCV: 87.2 fL (ref 78.0–100.0)
Platelets: 288 10*3/uL (ref 150–400)
RBC: 3.13 MIL/uL — ABNORMAL LOW (ref 4.22–5.81)
RDW: 17 % — AB (ref 11.5–15.5)
WBC: 5.3 10*3/uL (ref 4.0–10.5)

## 2018-07-26 LAB — GLUCOSE, CAPILLARY
GLUCOSE-CAPILLARY: 90 mg/dL (ref 70–99)
Glucose-Capillary: 98 mg/dL (ref 70–99)
Glucose-Capillary: 85 mg/dL (ref 70–99)

## 2018-07-26 LAB — HEMOGLOBIN A1C
HEMOGLOBIN A1C: 5.7 % — AB (ref 4.8–5.6)
MEAN PLASMA GLUCOSE: 116.89 mg/dL

## 2018-07-26 LAB — MRSA PCR SCREENING: MRSA by PCR: POSITIVE — AB

## 2018-07-26 LAB — RENAL FUNCTION PANEL
ALBUMIN: 2.9 g/dL — AB (ref 3.5–5.0)
Anion gap: 17 — ABNORMAL HIGH (ref 5–15)
BUN: 82 mg/dL — AB (ref 6–20)
CO2: 23 mmol/L (ref 22–32)
CREATININE: 14.14 mg/dL — AB (ref 0.61–1.24)
Calcium: 7.6 mg/dL — ABNORMAL LOW (ref 8.9–10.3)
Chloride: 100 mmol/L (ref 98–111)
GFR calc Af Amer: 4 mL/min — ABNORMAL LOW (ref >60)
GFR, EST NON AFRICAN AMERICAN: 3 mL/min — AB (ref >60)
GLUCOSE: 110 mg/dL — AB (ref 70–99)
PHOSPHORUS: 8.9 mg/dL — AB (ref 2.5–4.6)
Potassium: 5 mmol/L (ref 3.5–5.1)
SODIUM: 140 mmol/L (ref 135–145)

## 2018-07-26 MED ORDER — HEPARIN SODIUM (PORCINE) 1000 UNIT/ML IJ SOLN
INTRAMUSCULAR | Status: AC
  Filled 2018-07-26: qty 1

## 2018-07-26 MED ORDER — LIDOCAINE-PRILOCAINE 2.5-2.5 % EX CREA: 1.0000 "application " | TOPICAL_CREAM | CUTANEOUS | Status: DC | PRN

## 2018-07-26 MED ORDER — ANTICOAGULANT SODIUM CITRATE 4% (200MG/5ML) IV SOLN
5.0000 mL | Freq: Once | Status: AC
  Administered 2018-07-26: 5 mL via INTRAVENOUS
  Filled 2018-07-26 (×2): qty 5

## 2018-07-26 MED ORDER — MIDAZOLAM HCL 2 MG/2ML IJ SOLN
INTRAMUSCULAR | Status: AC | PRN
  Administered 2018-07-26 (×2): 1 mg via INTRAVENOUS

## 2018-07-26 MED ORDER — LIDOCAINE-EPINEPHRINE (PF) 2 %-1:200000 IJ SOLN
INTRAMUSCULAR | Status: AC | PRN
  Administered 2018-07-26: 10 mL

## 2018-07-26 MED ORDER — MIDAZOLAM HCL 2 MG/2ML IJ SOLN
INTRAMUSCULAR | Status: AC
  Filled 2018-07-26: qty 2

## 2018-07-26 MED ORDER — LIDOCAINE-EPINEPHRINE (PF) 1 %-1:200000 IJ SOLN
INTRAMUSCULAR | Status: AC
  Filled 2018-07-26: qty 30

## 2018-07-26 MED ORDER — GELATIN ABSORBABLE 12-7 MM EX MISC
CUTANEOUS | Status: AC
  Administered 2018-07-26: 15:00:00
  Filled 2018-07-26: qty 1

## 2018-07-26 MED ORDER — PENTAFLUOROPROP-TETRAFLUOROETH EX AERO: 1.0000 "application " | INHALATION_SPRAY | CUTANEOUS | Status: DC | PRN

## 2018-07-26 MED ORDER — AMLODIPINE BESYLATE 5 MG PO TABS: 10.0000 mg | ORAL_TABLET | Freq: Every day | ORAL | 0 refills | Status: AC

## 2018-07-26 MED ORDER — CEFAZOLIN SODIUM-DEXTROSE 2-4 GM/100ML-% IV SOLN
INTRAVENOUS | Status: AC
  Administered 2018-07-26: 2000 mg
  Filled 2018-07-26: qty 100

## 2018-07-26 MED ORDER — ALTEPLASE 2 MG IJ SOLR: 2.0000 mg | Freq: Once | INTRAMUSCULAR | Status: DC | PRN

## 2018-07-26 MED ORDER — FENTANYL CITRATE (PF) 100 MCG/2ML IJ SOLN
INTRAMUSCULAR | Status: AC
  Filled 2018-07-26: qty 2

## 2018-07-26 MED ORDER — SODIUM CHLORIDE 0.9 % IV SOLN: 100.0000 mL | INTRAVENOUS | Status: DC | PRN

## 2018-07-26 MED ORDER — ANTICOAGULANT SODIUM CITRATE 4% (200MG/5ML) IV SOLN
5.0000 mL | Freq: Once | Status: DC
  Filled 2018-07-26: qty 5

## 2018-07-26 MED ORDER — FENTANYL CITRATE (PF) 100 MCG/2ML IJ SOLN
INTRAMUSCULAR | Status: AC | PRN
  Administered 2018-07-26 (×2): 50 ug via INTRAVENOUS

## 2018-07-26 MED ORDER — CHLORHEXIDINE GLUCONATE CLOTH 2 % EX PADS: 6.0000 | MEDICATED_PAD | Freq: Every day | CUTANEOUS | Status: DC

## 2018-07-26 MED ORDER — MUPIROCIN 2 % EX OINT: 1.0000 "application " | TOPICAL_OINTMENT | Freq: Two times a day (BID) | CUTANEOUS | Status: DC

## 2018-07-26 MED ORDER — LIDOCAINE HCL (PF) 1 % IJ SOLN: 5.0000 mL | INTRAMUSCULAR | Status: DC | PRN

## 2018-07-26 NOTE — Care Management Note (Signed)
Case Management Note  Patient Details  Name: Elye Harmsen MRN: [id number] Date of Birth: October 17, 1956  Subjective/Objective:   ESRD-TTHSAT, clotted AV Graft/Fistula                 Action/Plan: NCM spoke to pt at bedside. His PCP is at [facility], no issues with transportation or getting meds. His son assist as needed. No NCM needs identified. Will continue to follow for dc needs. Contacted [facility] CSW, Burman Nieves to follow up.   Expected Discharge Date:                  Expected Discharge Plan:  Home/Self Care  In-House Referral:  NA  Discharge planning Services  CM Consult  Post Acute Care Choice:  NA Choice offered to:  NA  DME Arranged:  N/A DME Agency:  NA  HH Arranged:  NA HH Agency:  NA  Status of Service:  In process, will continue to follow  If discussed at Long Length of Stay Meetings, dates discussed:    Additional Comments:  Erenest Rasher, RN 07/26/2018, 11:36 AM

## 2018-07-26 NOTE — Progress Notes (Signed)
HD tx completed @ 2108 w/o problem, UF goal met, blood rinsed back, VSS w/ increased bp, report called to Danae Orleans, RN

## 2018-07-26 NOTE — Progress Notes (Signed)
Called to get report on pt to bring up for HD tx and found out from primary nurse that pt never got his temp HD cath nor his declot on 8/26 for 8/26 tx

## 2018-07-26 NOTE — Progress Notes (Signed)
Dr.Bloomfield notified of patient leaving AMA.

## 2018-07-26 NOTE — Progress Notes (Signed)
Received pt from Allen Morales, RN, HD tx initiated @ 1708 w/o problem per report, bilat ports: pull/push/flush equally w/o problem per report, VSS upon pre assess and initiation of HD tx per report, will cont to monitor while on HD tx

## 2018-07-26 NOTE — Progress Notes (Signed)
   Subjective: Mr. Owensby reported feeling well today. He denied any SOB, chest pain, abdominal pain, lower extremity swelling, or decreased/trouble urinating. He denied any blood in his stool or bleeding. He said he has never been offered alternative anticoagulation therapy with HD in the past.   Objective:  Vital signs in last 24 hours: Vitals:   07/25/18 1527 07/25/18 2103 07/26/18 0625 07/26/18 0836  BP: (!) 208/97 (!) 168/80 (!) 157/76 (!) 165/80  Pulse:  79 71 69  Resp:   18 18  Temp:  98.7 F (37.1 C) 97.6 F (36.4 C) (!) 97.5 F (36.4 C)  TempSrc:  Oral Oral Oral  SpO2:  100% 100%    Physical Exam  Constitutional: He is oriented to person, place, and time and well-developed, well-nourished, and in no distress.  Cardiovascular: Normal rate, regular rhythm and normal heart sounds.  No murmur heard. Pulmonary/Chest: Effort normal and breath sounds normal. No respiratory distress. He has no wheezes.  Abdominal: Soft. Bowel sounds are normal. He exhibits no distension. There is no tenderness.  Musculoskeletal: He exhibits no edema.  Neurological: He is alert and oriented to person, place, and time.  Skin: Skin is warm and dry.    Assessment/Plan:  Principal Problem:   Clotted dialysis access [facility]) Active Problems:   ESRD on dialysis [facility])   Hyperkalemia  Mr. Schnitzer is a 62 y.o male with a PMHx of stroke, type II DM, and ESRD on HD presenting with clotted dialysis access of his right arm AV graft after a thrombectomy last week.   Clotted dialysis access ESRD on dialysis T/Th/Sat: IR to perform thrombolysis/thrombectomy of AV Graft/Fistula vs possible angioplasty vs stent vs HD catheter placement today. No indications of chronic anticoagulation therapy. Would recommend citrate vs bovine heparin with HD. Nephrology consulted to determine whether patient needs HD today. Euvolemic on exam, electrolytes stable.  - f/u with IR and nephrology  - appreciate IR and nephro  recommendations  Anemia:  Hgb decreased from 10.8 to 8.4. No s/sx of active bleeding. Will follow up with nephrology.  Hyperkalemia: K 5.0 today. He is currently not having any symptoms and producing urine. Nephro consulted - f/u nephro recommendations  Hypertension:  167/80. Currently asymptomatic. - monitor vitals  - amlodipine 10 mg   DM: A1c 5.7, could be inaccurate in the setting of ESRD and increased cell turnover.   - f/u with PCP   Dispo: Patient is medically stable for discharge today after IR thrombolysis/thrombectomy and pending HD per nephro.  Carling Liberman N, DO 07/26/2018, 11:17 AM Pager: [phone]

## 2018-07-26 NOTE — Procedures (Signed)
Esrd, re occluded RUA AVG LOOP  [place] of RUE avg today confirms reocclusion after intervention last week.  Venous outflow across axilla has already been stented and now occluded.  rec HD cath and Vas surgery eval for new access site for a more long term durable access  S/p RT EJ HD cath  Tip svcra No comp Stable EBL min Ready for use

## 2018-07-26 NOTE — Progress Notes (Signed)
Patient stated," I can't wait for doctor to come discharge me tonight.My ride is here and I"m hungry and ready to go." Patient signed AMA paper and left with son.

## 2018-07-26 NOTE — Progress Notes (Signed)
Patient back from dialysis stating he is ready to go home.Patient does not have discharge orders.Notified on call MD Dr. Koleen Distance. Dr.Bloomfield to check to see if patient can be discharged tonight.

## 2018-07-26 NOTE — Discharge Summary (Signed)
Name: Allen Miller MRN: [id number] DOB: 01-Sep-1956 62 y.o. PCP: [facility], [facility]  Date of Admission: 07/25/2018 10:05 AM Date of Discharge: 07/26/2018 Attending Physician: Dr. Oval Linsey  Discharge Diagnosis: 1. Clotted dialysis access  2. ESRD on HD   Discharge Medications: Allergies as of 07/26/2018      Reactions   Pork-derived Products Other (See Comments)   NOT AN ALLERGY > RELIGIOUS REASONS   Ppd [tuberculin Purified Protein Derivative] Rash      Medication List    TAKE these medications   acetaminophen 325 MG tablet Commonly known as:  TYLENOL Take 2 tablets (650 mg total) by mouth every 6 (six) hours as needed for mild pain, fever or headache.   amLODipine 5 MG tablet Commonly known as:  NORVASC Take 2 tablets (10 mg total) by mouth daily.   aspirin 81 MG chewable tablet Chew 81 mg by mouth daily.   calcitRIOL 0.5 MCG capsule Commonly known as:  ROCALTROL Take 1 capsule (0.5 mcg total) by mouth every Monday, Wednesday, and Friday with hemodialysis. What changed:  when to take this   calcium acetate 667 MG capsule Commonly known as:  PHOSLO Take 667 mg by mouth 3 (three) times daily.   calcium carbonate 750 MG chewable tablet Commonly known as:  TUMS EX Chew 2 tablets by mouth as needed for heartburn.   ondansetron 4 MG disintegrating tablet Commonly known as:  ZOFRAN-ODT Take 1 tablet (4 mg total) by mouth every 8 (eight) hours as needed for nausea or vomiting.   pantoprazole 40 MG tablet Commonly known as:  PROTONIX Take 1 tablet (40 mg total) by mouth 2 (two) times daily.   Vitamin D3 1000 units Caps Take 2,000 Units by mouth daily.       Disposition and follow-up:   Mr.Kamarius Ramsburg was discharged from [facility] in Stable condition.  At the hospital follow up visit please address:  1.  Dialysis access: consider bovine heparin vs citrate with HD to prevent graft clotting   2.  Labs / imaging needed at time  of follow-up: none  3.  Pending labs/ test needing follow-up: none  Follow-up Appointments:   Hospital Course by problem list: 1. Clotted dialysis access: Patient presented with clotted right arm graft. He has a history of left arm AV graft clotting with three failed attempts.  Patient had a PD catheter for 3-4 months due to failed HD; it was  discontinued due to hematemesis in 11/2017. Right arm AV graft was utilized for HD shortly after. He had a thrombectomy last week on 8/22 but unfortunately re-clotted the next day when he returned for HD. He has never used heparin injections with HD due to religious reasons regarding pork in heparin. No other anticoagulation options have been discussed with him. He was euvolemic on exam with stable electrolytes. IR placed a catheter and he had HD before discharge. Nephro recommended aggrenox (ASA+dipyridamole) to help maintain patency of AVG. No indications for chronic anticoagulation therapy.  2. ESRD on HD: Patient gets HD on T/Th/Sat. Had not received HD for the past 6 days on admission but was euvolemic and had stable electrolytes. No indication for urgent HD. Received HD on day of discharge. 3. Anemia: Presented with stable Hgb of 10.8, decreased to 8.4. No s/sx of active bleeding. ESA per HD.   Discharge Vitals:   BP (!) 188/95 (BP Location: Left Arm)   Pulse 86   Temp (!) 97.4 F (36.3 C) (Oral)  Resp 18   Wt 190 lb 4.1 oz (86.3 kg)   SpO2 100%   BMI 32.66 kg/m   Pertinent Labs, Studies, and Procedures:   CMP Latest Ref Rng & Units 07/26/2018 07/25/2018 12/24/2017  Glucose 70 - 99 mg/dL 110(H) 81 131(H)  BUN 6 - 20 mg/dL 82(H) 75(H) 57(H)  Creatinine 0.61 - 1.24 mg/dL 14.14(H) 13.59(H) 11.22(H)  Sodium 135 - 145 mmol/L 140 144 137  Potassium 3.5 - 5.1 mmol/L 5.0 5.5(H) 3.8  Chloride 98 - 111 mmol/L 100 102 95(L)  CO2 22 - 32 mmol/L 23 22 25   Calcium 8.9 - 10.3 mg/dL 7.6(L) 8.3(L) 8.4(L)  Total Protein 6.5 - 8.1 g/dL - - -  Total  Bilirubin 0.3 - 1.2 mg/dL - - -  Alkaline Phos 38 - 126 U/L - - -  AST 15 - 41 U/L - - -  ALT 17 - 63 U/L - - -   CBC Latest Ref Rng & Units 07/26/2018 07/25/2018 12/24/2017  WBC 4.0 - 10.5 K/uL 5.3 5.4 8.1  Hemoglobin 13.0 - 17.0 g/dL 8.4(L) 10.8(L) 9.8(L)  Hematocrit 39.0 - 52.0 % 27.3(L) 36.2(L) 30.6(L)  Platelets 150 - 400 K/uL 288 297 282    Discharge Instructions: Discharge Instructions    Call MD for:  persistant nausea and vomiting   Complete by:  As directed    Call MD for:  temperature >100.4   Complete by:  As directed    Diet - low sodium heart healthy   Complete by:  As directed    Diet - low sodium heart healthy   Complete by:  As directed    Discharge instructions   Complete by:  As directed    Mr. Unice Bailey,  Please note the following changes in your medications:  -START amlodipine 5 mg once a day for high blood pressure     Thank you for allowing [place] to be a part of your care!   Increase activity slowly   Complete by:  As directed    Increase activity slowly   Complete by:  As directed       Signed: Mike Craze, DO 07/28/2018, 6:38 AM   Pager: [phone]

## 2018-07-26 NOTE — Progress Notes (Signed)
Subjective:  No cos , awaiting IR  procedure for clotted HD access  Then HD , potentially if stable after HD could  Be dc home .  Objective Vital signs in last 24 hours: Vitals:   07/25/18 1527 07/25/18 2103 07/26/18 0625 07/26/18 0836  BP: (!) 208/97 (!) 168/80 (!) 157/76 (!) 165/80  Pulse:  79 71 69  Resp:   18 18  Temp:  98.7 F (37.1 C) 97.6 F (36.4 C) (!) 97.5 F (36.4 C)  TempSrc:  Oral Oral Oral  SpO2:  100% 100%    Weight change:   Physical Exam: General: alert NAd , WD WN AAM Appropriate  Heart: RRR , no mur. Rub or gallop Lungs: CTA  Abdomen: BS  Pos soft , NT, ND  Extremities:no pedal edema   Dialysis Access: R UA AVG no thrill or briot      OP HD= [facility]   TTS   Problem/Plan: 1. ESRD: Clotted access RUA AVG ; Am k 5.0 , no sob and no emergency indications for dialysis but last treatment 8/19 .   IR  Procedure  for today.  2. Hypertension:  Asymptomatic HTN currently 165/80 . Likely volume related at least in part.  ON amlodipine 10 mg q day /  Monitor during admission  3.  Anemia of ESRD: Hb reasonable at 10.8. ?? Today 8.4  Will obtain outpt orders for ESA and recent iron indices.   4.  Metabolic Bone Disease: Ca ok. Phos 8.9  Calcium acetate binder   Can continue home meds here including binder.  Will obtain outpatient orders for VDRA, etc.  5 DM type 2 - per admit   Ernest Haber, PA-C [place] [phone] 07/26/2018,12:37 PM  LOS: 1 day   Labs: Basic Metabolic Panel: Recent Labs  Lab 07/25/18 1158 07/26/18 0142  NA 144 140  K 5.5* 5.0  CL 102 100  CO2 22 23  GLUCOSE 81 110*  BUN 75* 82*  CREATININE 13.59* 14.14*  CALCIUM 8.3* 7.6*  PHOS  --  8.9*   Liver Function Tests: Recent Labs  Lab 07/26/18 0142  ALBUMIN 2.9*   No results for input(s): LIPASE, AMYLASE in the last 168 hours. No results for input(s): AMMONIA in the last 168 hours. CBC: Recent Labs  Lab 07/25/18 1158 07/26/18 0142  WBC 5.4 5.3   HGB 10.8* 8.4*  HCT 36.2* 27.3*  MCV 89.6 87.2  PLT 297 288   Cardiac Enzymes: No results for input(s): CKTOTAL, CKMB, CKMBINDEX, TROPONINI in the last 168 hours. CBG: Recent Labs  Lab 07/25/18 1603 07/25/18 2219 07/26/18 0733  GLUCAP 81 169* 90    Studies/Results: No results found. Medications: . sodium chloride    . sodium chloride     . amLODipine  10 mg Oral Daily  . aspirin  81 mg Oral Daily  . calcium acetate  667 mg Oral TID WC  . Chlorhexidine Gluconate Cloth  6 each Topical Q0600  . cholecalciferol  2,000 Units Oral Daily  . mupirocin ointment  1 application Nasal BID  . pantoprazole  40 mg Oral BID

## 2018-07-27 LAB — HEPATITIS B SURFACE ANTIGEN: Hepatitis B Surface Ag: NEGATIVE

## 2018-07-29 ENCOUNTER — Telehealth: Payer: Self-pay | Admitting: Internal Medicine

## 2018-07-29 NOTE — Telephone Encounter (Signed)
Called the patient in regards to recent hospitalization and discharge from 07/25/2018 to 07/26/2018. Discussed nephrology's recommendation/suggestion to try Aggrenox to prevent further thrombosis/clotting of AVF and other HD access sites. He voiced understanding and states that he will talk with the doctors at the [place]. Does not want a prescription.

## 2019-05-01 DIAGNOSIS — A084 Viral intestinal infection, unspecified: Secondary | ICD-10-CM

## 2019-05-01 HISTORY — DX: Viral intestinal infection, unspecified: A08.4

## 2019-05-21 ENCOUNTER — Emergency Department (HOSPITAL_COMMUNITY): Payer: Medicare Other

## 2019-05-21 ENCOUNTER — Other Ambulatory Visit: Payer: Self-pay

## 2019-05-21 ENCOUNTER — Inpatient Hospital Stay (HOSPITAL_COMMUNITY)
Admission: EM | Admit: 2019-05-21 | Discharge: 2019-05-24 | DRG: 304 | Disposition: A | Discharge status: Home / Self Care | Payer: Medicare Other | Attending: Internal Medicine | Admitting: Internal Medicine

## 2019-05-21 DIAGNOSIS — Z91018 Allergy to other foods: Secondary | ICD-10-CM

## 2019-05-21 DIAGNOSIS — H532 Diplopia: Secondary | ICD-10-CM | POA: Diagnosis present

## 2019-05-21 DIAGNOSIS — Z79899 Other long term (current) drug therapy: Secondary | ICD-10-CM

## 2019-05-21 DIAGNOSIS — N186 End stage renal disease: Secondary | ICD-10-CM | POA: Diagnosis present

## 2019-05-21 DIAGNOSIS — R112 Nausea with vomiting, unspecified: Secondary | ICD-10-CM

## 2019-05-21 DIAGNOSIS — H81399 Other peripheral vertigo, unspecified ear: Secondary | ICD-10-CM | POA: Diagnosis present

## 2019-05-21 DIAGNOSIS — Z7982 Long term (current) use of aspirin: Secondary | ICD-10-CM

## 2019-05-21 DIAGNOSIS — K819 Cholecystitis, unspecified: Secondary | ICD-10-CM

## 2019-05-21 DIAGNOSIS — I160 Hypertensive urgency: Principal | ICD-10-CM | POA: Diagnosis present

## 2019-05-21 DIAGNOSIS — Z114 Encounter for screening for human immunodeficiency virus [HIV]: Secondary | ICD-10-CM

## 2019-05-21 DIAGNOSIS — E860 Dehydration: Secondary | ICD-10-CM | POA: Diagnosis present

## 2019-05-21 DIAGNOSIS — R066 Hiccough: Secondary | ICD-10-CM | POA: Diagnosis present

## 2019-05-21 DIAGNOSIS — A084 Viral intestinal infection, unspecified: Secondary | ICD-10-CM | POA: Diagnosis present

## 2019-05-21 DIAGNOSIS — K828 Other specified diseases of gallbladder: Secondary | ICD-10-CM | POA: Diagnosis present

## 2019-05-21 DIAGNOSIS — Z992 Dependence on renal dialysis: Secondary | ICD-10-CM

## 2019-05-21 DIAGNOSIS — E1122 Type 2 diabetes mellitus with diabetic chronic kidney disease: Secondary | ICD-10-CM | POA: Diagnosis present

## 2019-05-21 DIAGNOSIS — Z8673 Personal history of transient ischemic attack (TIA), and cerebral infarction without residual deficits: Secondary | ICD-10-CM

## 2019-05-21 DIAGNOSIS — Z888 Allergy status to other drugs, medicaments and biological substances status: Secondary | ICD-10-CM

## 2019-05-21 DIAGNOSIS — I120 Hypertensive chronic kidney disease with stage 5 chronic kidney disease or end stage renal disease: Secondary | ICD-10-CM | POA: Diagnosis present

## 2019-05-21 DIAGNOSIS — I10 Essential (primary) hypertension: Secondary | ICD-10-CM

## 2019-05-21 DIAGNOSIS — Z1159 Encounter for screening for other viral diseases: Secondary | ICD-10-CM

## 2019-05-21 DIAGNOSIS — Z85528 Personal history of other malignant neoplasm of kidney: Secondary | ICD-10-CM

## 2019-05-21 DIAGNOSIS — R1110 Vomiting, unspecified: Secondary | ICD-10-CM | POA: Diagnosis not present

## 2019-05-21 LAB — COMPREHENSIVE METABOLIC PANEL
ALT: 26 U/L (ref 0–44)
AST: 21 U/L (ref 15–41)
Albumin: 4.5 g/dL (ref 3.5–5.0)
Alkaline Phosphatase: 95 U/L (ref 38–126)
Anion gap: 20 — ABNORMAL HIGH (ref 5–15)
BUN: 44 mg/dL — ABNORMAL HIGH (ref 8–23)
CO2: 26 mmol/L (ref 22–32)
Calcium: 9.9 mg/dL (ref 8.9–10.3)
Chloride: 95 mmol/L — ABNORMAL LOW (ref 98–111)
Creatinine, Ser: 10.35 mg/dL — ABNORMAL HIGH (ref 0.61–1.24)
GFR calc Af Amer: 6 mL/min — ABNORMAL LOW (ref >60)
GFR calc non Af Amer: 5 mL/min — ABNORMAL LOW (ref >60)
Glucose, Bld: 158 mg/dL — ABNORMAL HIGH (ref 70–99)
Potassium: 4 mmol/L (ref 3.5–5.1)
Sodium: 141 mmol/L (ref 135–145)
Total Bilirubin: 1.1 mg/dL (ref 0.3–1.2)
Total Protein: 9 g/dL — ABNORMAL HIGH (ref 6.5–8.1)

## 2019-05-21 LAB — CBC WITH DIFFERENTIAL/PLATELET
Abs Immature Granulocytes: 0.02 10*3/uL (ref 0.00–0.07)
Basophils Absolute: 0 10*3/uL (ref 0.0–0.1)
Basophils Relative: 0 %
Eosinophils Absolute: 0 10*3/uL (ref 0.0–0.5)
Eosinophils Relative: 0 %
HCT: 38.1 % — ABNORMAL LOW (ref 39.0–52.0)
Hemoglobin: 12.6 g/dL — ABNORMAL LOW (ref 13.0–17.0)
Immature Granulocytes: 0 %
Lymphocytes Relative: 9 %
Lymphs Abs: 0.5 10*3/uL — ABNORMAL LOW (ref 0.7–4.0)
MCH: 28.6 pg (ref 26.0–34.0)
MCHC: 33.1 g/dL (ref 30.0–36.0)
MCV: 86.6 fL (ref 80.0–100.0)
Monocytes Absolute: 0.2 10*3/uL (ref 0.1–1.0)
Monocytes Relative: 3 %
Neutro Abs: 5.5 10*3/uL (ref 1.7–7.7)
Neutrophils Relative %: 88 %
Platelets: 237 10*3/uL (ref 150–400)
RBC: 4.4 MIL/uL (ref 4.22–5.81)
RDW: 15.1 % (ref 11.5–15.5)
WBC: 6.2 10*3/uL (ref 4.0–10.5)
nRBC: 0 % (ref 0.0–0.2)

## 2019-05-21 LAB — LACTIC ACID, PLASMA: Lactic Acid, Venous: 1.7 mmol/L (ref 0.5–1.9)

## 2019-05-21 MED ORDER — ONDANSETRON HCL 4 MG/2ML IJ SOLN
4.0000 mg | Freq: Once | INTRAMUSCULAR | Status: AC
  Administered 2019-05-21: 4 mg via INTRAVENOUS
  Filled 2019-05-21: qty 2

## 2019-05-21 MED ORDER — PROCHLORPERAZINE EDISYLATE 10 MG/2ML IJ SOLN
10.0000 mg | Freq: Once | INTRAMUSCULAR | Status: AC
  Administered 2019-05-21: 10 mg via INTRAVENOUS
  Filled 2019-05-21: qty 2

## 2019-05-21 MED ORDER — METOCLOPRAMIDE HCL 5 MG/ML IJ SOLN: 10.0000 mg | Freq: Once | INTRAMUSCULAR | Status: DC

## 2019-05-21 MED ORDER — SODIUM CHLORIDE 0.9 % IV BOLUS
250.0000 mL | Freq: Once | INTRAVENOUS | Status: AC
  Administered 2019-05-21: 250 mL via INTRAVENOUS

## 2019-05-21 NOTE — ED Provider Notes (Signed)
[facility] EMERGENCY DEPARTMENT Provider Note   CSN: [id number] Arrival date & time: 05/21/19  1942     History   Chief Complaint Chief Complaint  Patient presents with   Emesis    HPI Allen Miller is a 63 y.o. male.     Patient with history of end-stage renal disease on hemodialysis, hypertension, previous stroke --presents to the emergency department today with complaint of vomiting.  Patient states that his dialysis graft recently clotted and he was sent for placement of a dialysis catheter in his left upper leg 2 days ago.  He was given anesthesia at that time.  Since that time, he has had persistent vomiting.  Patient had dialysis yesterday and was given an antiemetic there.  He has had some mild generalized abdominal pain but no focal pain.  No chest pain or shortness of breath.  He feels generalized weakness, lightheadedness and dizziness.  He denies any fevers but has felt sweaty.  No lower extremity swelling.  He states that he has had trouble with vomiting after anesthesia in the past.  Onset of symptoms acute.  Course is persistent.  Nothing makes symptoms better or worse.     Past Medical History:  Diagnosis Date   Cancer ([place])    kidney cancer   Coffee ground emesis 11/2017   ESRD (end stage renal disease) ([place])    MWF   Hemodialysis patient ([place])    Hypertension    Low iron    Shortness of breath dyspnea    when lying flat   Stroke [facility])    effective vision- Left eye    Patient Active Problem List   Diagnosis Date Noted   Essential hypertension    Clotted dialysis access ([place]) 07/25/2018   GERD with esophagitis 12/26/2017   Dysphagia    Non-intractable vomiting with nausea    Coffee ground emesis 12/21/2017   Chest pain 12/21/2017   Hyperkalemia 08/25/2016   Acute CVA (cerebrovascular accident) ([place]) 01/24/2016   Hypertensive emergency 01/21/2016   History of CVA (cerebrovascular accident) 01/21/2016    Visual disturbance 01/21/2016   Microcytic anemia 01/21/2016   ESRD on dialysis ([place]) 01/21/2016   Blurred vision, left eye     Past Surgical History:  Procedure Laterality Date   A/V FISTULAGRAM Right 06/25/2017   Procedure: A/V Fistulagram;  Surgeon: Elam Dutch, MD;  Location: [place] CV LAB;  Service: Cardiovascular;  Laterality: Right;   AV FISTULA PLACEMENT Left 10/27/2016   Procedure: INSERTION LEFT UPPER ARM  OF ARTERIOVENOUS (AV) GORE-TEX GRAFT ARM;  Surgeon: Conrad Williamsfield, MD;  Location: [place];  Service: Vascular;  Laterality: Left;   AV FISTULA PLACEMENT Right 03/23/2017   Procedure: ARTERIOVENOUS (AV) FISTULA CREATION;  Surgeon: Elam Dutch, MD;  Location: [place];  Service: Vascular;  Laterality: Right;   [place] Left 02/24/2016   Procedure: BASILIC VEIN TRANSPOSITION VERSUS ARTERIOVENOUS GRAFT INSERTION;  Surgeon: [name], MD;  Location: [place];  Service: Vascular;  Laterality: Left;   [place] Left 07/14/2016   Procedure: LEFT BASILIC VEIN TRANSPOSITION;  Surgeon: Conrad Greenback, MD;  Location: [place];  Service: Vascular;  Laterality: Left;   [place] Left 09/08/2016   Procedure: SECOND STAGE BASILIC VEIN TRANSPOSITION LEFT UPPER ARM;  Surgeon: Conrad Naugatuck, MD;  Location: [place];  Service: Vascular;  Laterality: Left;   ESOPHAGOGASTRODUODENOSCOPY N/A 12/26/2017   Procedure: ESOPHAGOGASTRODUODENOSCOPY (EGD);  Surgeon: Ladene Artist, MD;  Location: [facility];  Service: Endoscopy;  Laterality: N/A;   INSERTION OF DIALYSIS CATHETER N/A 02/24/2016   Procedure: INSERTION OF DIALYSIS CATHETER;  Surgeon: [name], MD;  Location: [place];  Service: Vascular;  Laterality: N/A;   INSERTION OF DIALYSIS CATHETER Right 10/27/2016   Procedure: EXCHANGE OF RIGHT INTERNAL JUGULAR DIALYSIS CATHETER;  Surgeon: Conrad Wabasso, MD;  Location: [place];  Service: Vascular;  Laterality: Right;   IR FLUORO GUIDE CV LINE  RIGHT  07/26/2018   IR US GUIDE VASC ACCESS RIGHT  07/26/2018   PERIPHERAL VASCULAR BALLOON ANGIOPLASTY Right 06/25/2017   Procedure: Peripheral Vascular Balloon Angioplasty;  Surgeon: Elam Dutch, MD;  Location: [place] CV LAB;  Service: Cardiovascular;  Laterality: Right;   THROMBECTOMY AND REVISION OF ARTERIOVENTOUS (AV) GORETEX  GRAFT Left 03/02/2017   Procedure: THROMBECTOMY AND REVISION OF ARTERIOVENous left arm GORETEX  GRAFT;  Surgeon: Elam Dutch, MD;  Location: [facility] OR;  Service: Vascular;  Laterality: Left;   THROMBECTOMY W/ EMBOLECTOMY Left 12/14/2016   Procedure: THROMBECTOMY OF LEFT  ARTERIO-VENOUS GORTEX GRAFT; REVISION OF LEFT  ARTERIO-VENOUS GORE-TEX GRAFT;  Surgeon: [name], MD;  Location: [place];  Service: Vascular;  Laterality: Left;        Home Medications    Prior to Admission medications   Medication Sig Start Date End Date Taking? Authorizing Provider  acetaminophen (TYLENOL) 325 MG tablet Take 2 tablets (650 mg total) by mouth every 6 (six) hours as needed for mild pain, fever or headache. 12/24/17   Mariel Aloe, MD  amLODipine (NORVASC) 5 MG tablet Take 2 tablets (10 mg total) by mouth daily. 07/26/18   Rehman, Areeg N, DO  aspirin 81 MG chewable tablet Chew 81 mg by mouth daily.     [provider]  calcitRIOL (ROCALTROL) 0.5 MCG capsule Take 1 capsule (0.5 mcg total) by mouth every Monday, Wednesday, and Friday with hemodialysis. Patient taking differently: Take 0.5 mcg by mouth Every Tuesday,Thursday,and Saturday with dialysis.  12/27/17   Mariel Aloe, MD  calcium acetate (PHOSLO) 667 MG capsule Take 667 mg by mouth 3 (three) times daily. 03/02/16   [provider]  calcium carbonate (TUMS EX) 750 MG chewable tablet Chew 2 tablets by mouth as needed for heartburn.    [provider]  Cholecalciferol (VITAMIN D3) 1000 units CAPS Take 2,000 Units by mouth daily.    [provider]  ondansetron (ZOFRAN ODT) 4 MG  disintegrating tablet Take 1 tablet (4 mg total) by mouth every 8 (eight) hours as needed for nausea or vomiting. 12/26/17   Mariel Aloe, MD  pantoprazole (PROTONIX) 40 MG tablet Take 1 tablet (40 mg total) by mouth 2 (two) times daily. 12/26/17   Mariel Aloe, MD    Family History No family history on file.  Social History Social History   Tobacco Use   Smoking status: Never Smoker   Smokeless tobacco: Never Used  Substance Use Topics   Alcohol use: No   Drug use: No     Allergies   Pork-derived products and Ppd [tuberculin purified protein derivative]   Review of Systems Review of Systems  Constitutional: Positive for fatigue. Negative for fever.  HENT: Negative for rhinorrhea and sore throat.   Eyes: Negative for redness.  Respiratory: Negative for cough and shortness of breath.   Cardiovascular: Negative for chest pain.  Gastrointestinal: Positive for abdominal pain (Generalized mild), nausea and vomiting. Negative for diarrhea.  Genitourinary: Negative  for dysuria.  Musculoskeletal: Negative for myalgias.  Skin: Negative for rash.  Neurological: Positive for weakness (Generalized) and light-headedness. Negative for headaches.     Physical Exam Updated Vital Signs BP (!) 226/103 (BP Location: Right Arm)    Pulse 89    Temp 97.9 F (36.6 C) (Oral)    Resp 18    Ht 5\' 5"  (1.651 m)    Wt 76.7 kg    SpO2 97%    BMI 28.12 kg/m   Physical Exam Vitals signs and nursing note reviewed.  Constitutional:      General: He is not in acute distress.    Appearance: He is well-developed. He is ill-appearing.  HENT:     Head: Normocephalic and atraumatic.     Mouth/Throat:     Mouth: Mucous membranes are dry.  Eyes:     General:        Right eye: No discharge.        Left eye: No discharge.     Conjunctiva/sclera: Conjunctivae normal.  Neck:     Musculoskeletal: Normal range of motion and neck supple.  Cardiovascular:     Rate and Rhythm: Normal rate and  regular rhythm.     Heart sounds: Normal heart sounds.  Pulmonary:     Effort: Pulmonary effort is normal.     Breath sounds: Normal breath sounds.  Abdominal:     Palpations: Abdomen is soft.     Tenderness: There is no abdominal tenderness. There is no guarding or rebound.     Comments: Abdomen is soft and nontender at time of exam.  Musculoskeletal:        General: No swelling.  Skin:    General: Skin is warm and dry.  Neurological:     Mental Status: He is alert.      ED Treatments / Results  Labs (all labs ordered are listed, but only abnormal results are displayed) Labs Reviewed  COMPREHENSIVE METABOLIC PANEL - Abnormal; Notable for the following components:      Result Value   Chloride 95 (*)    Glucose, Bld 158 (*)    BUN 44 (*)    Creatinine, Ser 10.35 (*)    Total Protein 9.0 (*)    GFR calc non Af Amer 5 (*)    GFR calc Af Amer 6 (*)    Anion gap 20 (*)    All other components within normal limits  CBC WITH DIFFERENTIAL/PLATELET - Abnormal; Notable for the following components:   Hemoglobin 12.6 (*)    HCT 38.1 (*)    Lymphs Abs 0.5 (*)    All other components within normal limits  SARS CORONAVIRUS 2 (HOSPITAL ORDER, [facility])  LACTIC ACID, PLASMA    EKG None  Radiology Ct Abdomen Pelvis Wo Contrast  Result Date: 05/21/2019 CLINICAL DATA:  63 year old male hemodialysis patient presenting with nausea vomiting and abdominal pain. EXAM: CT ABDOMEN AND PELVIS WITHOUT CONTRAST TECHNIQUE: Multidetector CT imaging of the abdomen and pelvis was performed following the standard protocol without IV contrast. COMPARISON:  None. FINDINGS: Evaluation of this exam is limited in the absence of intravenous contrast. Lower chest: The visualized lung bases are clear. No intra-abdominal free air or free fluid. Hepatobiliary: The liver is unremarkable. No intrahepatic biliary ductal dilatation. There is sludge or small stones within the  gallbladder. No pericholecystic fluid or evidence of acute cholecystitis by CT. Pancreas: Mild fatty infiltration of the head and uncinate process of the  pancreas. The pancreas is otherwise unremarkable. Spleen: Normal in size without focal abnormality. Adrenals/Urinary Tract: Left adrenal thickening/hyperplasia. The right adrenal gland is unremarkable. Possible punctate right renal interpolar calculus. No hydronephrosis. There is no hydronephrosis or nephrolithiasis on the left. 1 cm exophytic hypodense lesion from the upper pole of the left kidney, indeterminate, likely a cyst. The visualized ureters and urinary bladder appear unremarkable. Stomach/Bowel: There is no bowel obstruction or active inflammation. Normal appendix. Vascular/Lymphatic: Mild aortoiliac atherosclerotic disease. Left common femoral approach central venous line with tip in the intrahepatic IVC. Partially visualized catheter with tip at the junction of the SVC/RA. There is no portal venous gas. No adenopathy. Reproductive: Mildly enlarged prostate gland measuring 5 cm in transverse axial diameter. Other: Small fat containing umbilical hernia. No fluid collection. Musculoskeletal: Mild degenerative changes. No acute osseous pathology. IMPRESSION: 1. No acute intra-abdominal or pelvic pathology. No bowel obstruction or active inflammation. Normal appendix. 2. Gallbladder sludge or small stones. No evidence of acute cholecystitis by CT. Electronically Signed   By: Anner Crete M.D.   On: 05/21/2019 23:07    Procedures Procedures (including critical care time)  Medications Ordered in ED Medications  ondansetron [facility]) injection 4 mg (4 mg Intravenous Given 05/21/19 2203)  sodium chloride 0.9 % bolus 250 mL (0 mLs Intravenous Stopped 05/21/19 2318)  prochlorperazine (COMPAZINE) injection 10 mg (10 mg Intravenous Given 05/21/19 2240)     Initial Impression / Assessment and Plan / ED Course  I have reviewed the triage vital signs  and the nursing notes.  Pertinent labs & imaging results that were available during my care of the patient were reviewed by me and considered in my medical decision making (see chart for details).        Patient seen and examined.  Work-up reviewed and is otherwise reassuring.  Exam is reassuring.  Patient continues to have episodes of vomiting.  Antiemetic ordered.  He actually looks a bit fluid down in 250 cc of normal saline ordered.  Hemoglobin is concentrated above his baseline.  He is also significantly hypertensive at 226/103.  Patient has not been able to take his medications due to vomiting.  Will reassess after treatment.  May need admission for intractable vomiting and hypertensive urgency.  Vital signs reviewed and are as follows: BP (!) 226/103 (BP Location: Right Arm)    Pulse 89    Temp 97.9 F (36.6 C) (Oral)    Resp 18    Ht 5\' 5"  (1.651 m)    Wt 76.7 kg    SpO2 97%    BMI 28.12 kg/m   12:19 AM patient had persistent vomiting through Zofran.  He still has significant nausea after Compazine but has not been vomiting as much.  CT personally reviewed.  No emergent findings.  Blood pressure is improving.  Patient discussed with and seen by Dr. Ronnald Nian.  Will request observation admission given patient's comorbidities and intractable symptoms.  Final Clinical Impressions(s) / ED Diagnoses   Final diagnoses:  Intractable vomiting with nausea, unspecified vomiting type  Hypertension, unspecified type  ESRD (end stage renal disease) ([place])   Admit.   ED Discharge Orders    None       Carlisle Cater, PA-C 05/22/19 0032    Lennice Sites, DO 05/22/19 2155

## 2019-05-21 NOTE — ED Provider Notes (Signed)
Medical screening examination/treatment/procedure(s) were conducted as a shared visit with non-physician practitioner(s) and myself.  I personally evaluated the patient during the encounter. Briefly, the patient is a 63 y.o. male with history of end-stage renal disease who presents to the ED with nausea and vomiting.  Patient has had symptoms for the last 2 days after he had new fistula placed in his left groin.  Patient has not been able to tolerate medications or fluids.  He states that when he has had anesthesia in the past that he has had persistent nausea vomiting as well.  Patient states that he has not had a normal bowel movement or passed much gas.  He is having active vomiting on my evaluation.  There is some feculent type material in this vomit.  Suspect ileus or possible bowel obstruction.  Could be secondary to anesthesia.  Patient hypertensive upon arrival.  Likely due to unable to take his medication/stress from vomiting.  Lab work overall is unremarkable.  Patient given several rounds IV antiemetics.  CT scan overall unremarkable.  No obstruction.  However, given patient's severe symptoms believe he would benefit from further IV fluid, IV antiemetics.  Suspect that this may be secondary to ileus or anesthesia side effect.  Gallbladder and liver enzymes within normal limits.  No specific right upper quadrant tenderness.  This chart was dictated using voice recognition software.  Despite best efforts to proofread,  errors can occur which can change the documentation meaning.     EKG Interpretation None          Lennice Sites, DO 05/21/19 2335

## 2019-05-21 NOTE — ED Triage Notes (Signed)
Pt c/o vomiting and dizziness since he had a surgical procedure done on Friday. Had a dialysis catheter placed in left leg.

## 2019-05-22 ENCOUNTER — Encounter (HOSPITAL_COMMUNITY): Payer: Self-pay | Admitting: General Practice

## 2019-05-22 DIAGNOSIS — Z79899 Other long term (current) drug therapy: Secondary | ICD-10-CM | POA: Diagnosis not present

## 2019-05-22 DIAGNOSIS — N186 End stage renal disease: Secondary | ICD-10-CM | POA: Diagnosis present

## 2019-05-22 DIAGNOSIS — R1110 Vomiting, unspecified: Secondary | ICD-10-CM | POA: Diagnosis present

## 2019-05-22 DIAGNOSIS — H81399 Other peripheral vertigo, unspecified ear: Secondary | ICD-10-CM | POA: Diagnosis present

## 2019-05-22 DIAGNOSIS — R066 Hiccough: Secondary | ICD-10-CM | POA: Diagnosis present

## 2019-05-22 DIAGNOSIS — Z1159 Encounter for screening for other viral diseases: Secondary | ICD-10-CM | POA: Diagnosis not present

## 2019-05-22 DIAGNOSIS — Z114 Encounter for screening for human immunodeficiency virus [HIV]: Secondary | ICD-10-CM

## 2019-05-22 DIAGNOSIS — E1122 Type 2 diabetes mellitus with diabetic chronic kidney disease: Secondary | ICD-10-CM | POA: Diagnosis present

## 2019-05-22 DIAGNOSIS — I120 Hypertensive chronic kidney disease with stage 5 chronic kidney disease or end stage renal disease: Secondary | ICD-10-CM | POA: Diagnosis present

## 2019-05-22 DIAGNOSIS — Z888 Allergy status to other drugs, medicaments and biological substances status: Secondary | ICD-10-CM | POA: Diagnosis not present

## 2019-05-22 DIAGNOSIS — A084 Viral intestinal infection, unspecified: Secondary | ICD-10-CM | POA: Diagnosis present

## 2019-05-22 DIAGNOSIS — Z91018 Allergy to other foods: Secondary | ICD-10-CM | POA: Diagnosis not present

## 2019-05-22 DIAGNOSIS — R112 Nausea with vomiting, unspecified: Secondary | ICD-10-CM | POA: Diagnosis not present

## 2019-05-22 DIAGNOSIS — Z7982 Long term (current) use of aspirin: Secondary | ICD-10-CM | POA: Diagnosis not present

## 2019-05-22 DIAGNOSIS — Z992 Dependence on renal dialysis: Secondary | ICD-10-CM | POA: Diagnosis not present

## 2019-05-22 DIAGNOSIS — E860 Dehydration: Secondary | ICD-10-CM | POA: Diagnosis present

## 2019-05-22 DIAGNOSIS — Z85528 Personal history of other malignant neoplasm of kidney: Secondary | ICD-10-CM | POA: Diagnosis not present

## 2019-05-22 DIAGNOSIS — K828 Other specified diseases of gallbladder: Secondary | ICD-10-CM | POA: Diagnosis present

## 2019-05-22 DIAGNOSIS — I160 Hypertensive urgency: Secondary | ICD-10-CM | POA: Diagnosis present

## 2019-05-22 DIAGNOSIS — H532 Diplopia: Secondary | ICD-10-CM | POA: Diagnosis present

## 2019-05-22 DIAGNOSIS — Z8673 Personal history of transient ischemic attack (TIA), and cerebral infarction without residual deficits: Secondary | ICD-10-CM | POA: Diagnosis not present

## 2019-05-22 LAB — BASIC METABOLIC PANEL
Anion gap: 16 — ABNORMAL HIGH (ref 5–15)
BUN: 52 mg/dL — ABNORMAL HIGH (ref 8–23)
CO2: 27 mmol/L (ref 22–32)
Calcium: 9.1 mg/dL (ref 8.9–10.3)
Chloride: 96 mmol/L — ABNORMAL LOW (ref 98–111)
Creatinine, Ser: 11.43 mg/dL — ABNORMAL HIGH (ref 0.61–1.24)
GFR calc Af Amer: 5 mL/min — ABNORMAL LOW (ref >60)
GFR calc non Af Amer: 4 mL/min — ABNORMAL LOW (ref >60)
Glucose, Bld: 132 mg/dL — ABNORMAL HIGH (ref 70–99)
Potassium: 4.3 mmol/L (ref 3.5–5.1)
Sodium: 139 mmol/L (ref 135–145)

## 2019-05-22 LAB — HIV ANTIBODY (ROUTINE TESTING W REFLEX): HIV Screen 4th Generation wRfx: NONREACTIVE

## 2019-05-22 LAB — SARS CORONAVIRUS 2 BY RT PCR (HOSPITAL ORDER, PERFORMED IN ~~LOC~~ HOSPITAL LAB): SARS Coronavirus 2: NEGATIVE

## 2019-05-22 MED ORDER — ONDANSETRON HCL 4 MG/2ML IJ SOLN
4.0000 mg | Freq: Four times a day (QID) | INTRAMUSCULAR | Status: DC | PRN
  Administered 2019-05-22 – 2019-05-23 (×4): 4 mg via INTRAVENOUS
  Filled 2019-05-22 (×4): qty 2

## 2019-05-22 MED ORDER — LABETALOL HCL 5 MG/ML IV SOLN
10.0000 mg | Freq: Four times a day (QID) | INTRAVENOUS | Status: DC
  Administered 2019-05-23 (×2): 10 mg via INTRAVENOUS
  Filled 2019-05-22 (×5): qty 4

## 2019-05-22 MED ORDER — HYDRALAZINE HCL 20 MG/ML IJ SOLN
10.0000 mg | INTRAMUSCULAR | Status: DC | PRN
  Administered 2019-05-23 (×2): 10 mg via INTRAVENOUS
  Filled 2019-05-22 (×2): qty 1

## 2019-05-22 MED ORDER — ACETAMINOPHEN 325 MG PO TABS: 650.0000 mg | ORAL_TABLET | Freq: Four times a day (QID) | ORAL | Status: DC | PRN

## 2019-05-22 MED ORDER — CHLORHEXIDINE GLUCONATE CLOTH 2 % EX PADS
6.0000 | MEDICATED_PAD | Freq: Every day | CUTANEOUS | Status: DC
  Administered 2019-05-23 – 2019-05-24 (×2): 6 via TOPICAL

## 2019-05-22 MED ORDER — ACETAMINOPHEN 650 MG RE SUPP: 650.0000 mg | Freq: Four times a day (QID) | RECTAL | Status: DC | PRN

## 2019-05-22 MED ORDER — LABETALOL HCL 5 MG/ML IV SOLN: 5.0000 mg | Freq: Four times a day (QID) | INTRAVENOUS | Status: DC

## 2019-05-22 MED ORDER — PROMETHAZINE HCL 25 MG/ML IJ SOLN
25.0000 mg | INTRAMUSCULAR | Status: DC | PRN
  Administered 2019-05-22 – 2019-05-23 (×3): 25 mg via INTRAVENOUS
  Filled 2019-05-22 (×3): qty 1

## 2019-05-22 MED ORDER — LABETALOL HCL 5 MG/ML IV SOLN
10.0000 mg | Freq: Once | INTRAVENOUS | Status: AC
  Administered 2019-05-22: 10 mg via INTRAVENOUS

## 2019-05-22 MED ORDER — LABETALOL HCL 5 MG/ML IV SOLN
5.0000 mg | Freq: Three times a day (TID) | INTRAVENOUS | Status: DC
  Administered 2019-05-22: 18:00:00 5 mg via INTRAVENOUS
  Filled 2019-05-22: qty 4

## 2019-05-22 MED ORDER — SODIUM CHLORIDE 0.9 % IV SOLN
INTRAVENOUS | Status: AC
  Administered 2019-05-22: 04:00:00 via INTRAVENOUS

## 2019-05-22 MED ORDER — PROMETHAZINE HCL 25 MG/ML IJ SOLN
25.0000 mg | Freq: Four times a day (QID) | INTRAMUSCULAR | Status: DC | PRN
  Administered 2019-05-22: 25 mg via INTRAVENOUS
  Filled 2019-05-22: qty 1

## 2019-05-22 MED ORDER — HYDRALAZINE HCL 20 MG/ML IJ SOLN
5.0000 mg | INTRAMUSCULAR | Status: DC | PRN
  Administered 2019-05-22 (×2): 5 mg via INTRAVENOUS
  Filled 2019-05-22 (×2): qty 1

## 2019-05-22 MED ORDER — HYDRALAZINE HCL 20 MG/ML IJ SOLN
10.0000 mg | Freq: Once | INTRAMUSCULAR | Status: AC
  Administered 2019-05-22: 10 mg via INTRAVENOUS
  Filled 2019-05-22: qty 1

## 2019-05-22 MED ORDER — SODIUM CHLORIDE 0.9 % IV SOLN
INTRAVENOUS | Status: AC
  Administered 2019-05-22: 17:00:00 via INTRAVENOUS

## 2019-05-22 NOTE — ED Notes (Signed)
Hospital bed ordered. Will tranfers pt on arrival.

## 2019-05-22 NOTE — ED Notes (Signed)
Renal lunch tray ordered

## 2019-05-22 NOTE — Consult Note (Signed)
Renal Service Consult Note [facility]  Allen Miller 05/22/2019 Allen Miller Requesting Physician:  Dr Verlon Au  Reason for Consult:  ESRD pt w/ refractory N/V HPI: The patient is a 63 y.o. year-old with hx of DM2, HTN, ESRD on HD x 3 yrs TTS in [place] presented w/ N/V onset after new L thigh HD cath placement 3 days ago.  Multiple episodes of emesis which started after the procedure. Last HD was Sat, has not missed HD.  Asked to see for ESRD.  CT abd showed no acute pathology.  COVID 19 was negative. Pt rec'd reglan, zofran, compazine in the ED.  BP's were up on arrival 226/103.  Hb 12.5, lactate wnl.   Saw pt in ED, refractory N/V in the process of vomiting.  No abd pain per se.  No cough, SOB, CP or fevers.  No sig diarrhea.    ROS  denies CP  no joint pain   no HA  no blurry vision  no rash   Past Medical History  Past Medical History:  Diagnosis Date  . Cancer [facility])    kidney cancer  . Coffee ground emesis 11/2017  . ESRD (end stage renal disease) ([place])    MWF  . Hemodialysis patient ([place])   . Hypertension   . Low iron   . Shortness of breath dyspnea    when lying flat  . Stroke [facility])    effective vision- Left eye   Past Surgical History  Past Surgical History:  Procedure Laterality Date  . A/V FISTULAGRAM Right 06/25/2017   Procedure: A/V Fistulagram;  Surgeon: Elam Dutch, MD;  Location: [place] [facility];  Service: Cardiovascular;  Laterality: Right;  . AV FISTULA PLACEMENT Left 10/27/2016   Procedure: INSERTION LEFT UPPER ARM  OF ARTERIOVENOUS (AV) GORE-TEX GRAFT ARM;  Surgeon: Conrad Winston, MD;  Location: [place];  Service: Vascular;  Laterality: Left;  . AV FISTULA PLACEMENT Right 03/23/2017   Procedure: ARTERIOVENOUS (AV) FISTULA CREATION;  Surgeon: Elam Dutch, MD;  Location: [place];  Service: Vascular;  Laterality: Right;  . BASCILIC VEIN TRANSPOSITION Left 02/24/2016   Procedure: BASILIC VEIN TRANSPOSITION VERSUS  ARTERIOVENOUS GRAFT INSERTION;  Surgeon: [name], MD;  Location: [place];  Service: Vascular;  Laterality: Left;  . BASCILIC VEIN TRANSPOSITION Left 07/14/2016   Procedure: LEFT BASILIC VEIN TRANSPOSITION;  Surgeon: Conrad Tuttle, MD;  Location: [place];  Service: Vascular;  Laterality: Left;  . BASCILIC VEIN TRANSPOSITION Left 09/08/2016   Procedure: SECOND STAGE BASILIC VEIN TRANSPOSITION LEFT UPPER ARM;  Surgeon: Conrad Coffey[place], MD;  Location: [place];  Service: Vascular;  Laterality: Left;  . ESOPHAGOGASTRODUODENOSCOPY N/A 12/26/2017   Procedure: ESOPHAGOGASTRODUODENOSCOPY (EGD);  Surgeon: Ladene Artist, MD;  Location: [facility] ENDOSCOPY;  Service: Endoscopy;  Laterality: N/A;  . INSERTION OF DIALYSIS CATHETER N/A 02/24/2016   Procedure: INSERTION OF DIALYSIS CATHETER;  Surgeon: [name], MD;  Location: [place];  Service: Vascular;  Laterality: N/A;  . INSERTION OF DIALYSIS CATHETER Right 10/27/2016   Procedure: EXCHANGE OF RIGHT INTERNAL JUGULAR DIALYSIS CATHETER;  Surgeon: Conrad Ayrshire, MD;  Location: [place];  Service: Vascular;  Laterality: Right;  . IR FLUORO GUIDE CV LINE RIGHT  07/26/2018  . IR US GUIDE VASC ACCESS RIGHT  07/26/2018  . PERIPHERAL VASCULAR BALLOON ANGIOPLASTY Right 06/25/2017   Procedure: Peripheral Vascular Balloon Angioplasty;  Surgeon: Elam Dutch, MD;  Location: [place] CV LAB;  Service: Cardiovascular;  Laterality:  Right;  Marland Kitchen THROMBECTOMY AND REVISION OF ARTERIOVENTOUS (AV) GORETEX  GRAFT Left 03/02/2017   Procedure: THROMBECTOMY AND REVISION OF ARTERIOVENous left arm GORETEX  GRAFT;  Surgeon: Elam Dutch, MD;  Location: [place];  Service: Vascular;  Laterality: Left;  . THROMBECTOMY W/ EMBOLECTOMY Left 12/14/2016   Procedure: THROMBECTOMY OF LEFT  ARTERIO-VENOUS GORTEX GRAFT; REVISION OF LEFT  ARTERIO-VENOUS GORE-TEX GRAFT;  Surgeon: [name], MD;  Location: [place];  Service: Vascular;  Laterality: Left;   Family History No family history on file. Social History   reports that he has never smoked. He has never used smokeless tobacco. He reports that he does not drink alcohol or use drugs. Allergies  Allergies  Allergen Reactions  . Pork-Derived Products Other (See Comments)    NOT AN ALLERGY > RELIGIOUS REASONS  . Ppd [Tuberculin Purified Protein Derivative] Rash   Home medications Prior to Admission medications   Medication Sig Start Date End Date Taking? Authorizing Provider  acetaminophen (TYLENOL) 325 MG tablet Take 2 tablets (650 mg total) by mouth every 6 (six) hours as needed for mild pain, fever or headache. 12/24/17  Yes Mariel Aloe, MD  amLODipine (NORVASC) 5 MG tablet Take 2 tablets (10 mg total) by mouth daily. 07/26/18  Yes Rehman, Areeg N, DO  aspirin 81 MG chewable tablet Chew 81 mg by mouth daily.    Yes [provider]  brimonidine (ALPHAGAN) 0.2 % ophthalmic solution Place 1 drop into the left eye 3 (three) times daily.   Yes [provider]  calcitRIOL (ROCALTROL) 0.5 MCG capsule Take 1 capsule (0.5 mcg total) by mouth every Monday, Wednesday, and Friday with hemodialysis. Patient taking differently: Take 0.5 mcg by mouth Every Tuesday,Thursday,and Saturday with dialysis.  12/27/17  Yes Mariel Aloe, MD  calcium carbonate (TUMS EX) 750 MG chewable tablet Chew 2 tablets by mouth as needed for heartburn.   Yes [provider]  lanthanum (FOSRENOL) 500 MG chewable tablet Chew 500 mg by mouth 3 (three) times daily with meals.   Yes [provider]  ondansetron (ZOFRAN ODT) 4 MG disintegrating tablet Take 1 tablet (4 mg total) by mouth every 8 (eight) hours as needed for nausea or vomiting. 12/26/17  Yes Mariel Aloe, MD  pantoprazole (PROTONIX) 40 MG tablet Take 1 tablet (40 mg total) by mouth 2 (two) times daily. Patient not taking: Reported on 05/22/2019 12/26/17   Mariel Aloe, MD   Liver Function Tests Recent Labs  Lab 05/21/19 2002  AST 21  ALT 26  ALKPHOS 95  BILITOT 1.1  PROT 9.0*   ALBUMIN 4.5   No results for input(s): LIPASE, AMYLASE in the last 168 hours. CBC Recent Labs  Lab 05/21/19 2002  WBC 6.2  NEUTROABS 5.5  HGB 12.6*  HCT 38.1*  MCV 86.6  PLT 299   Basic Metabolic Panel Recent Labs  Lab 05/21/19 2002 05/22/19 0342  NA 141 139  K 4.0 4.3  CL 95* 96*  CO2 26 27  GLUCOSE 158* 132*  BUN 44* 52*  CREATININE 10.35* 11.43*  CALCIUM 9.9 9.1   Iron/TIBC/Ferritin/ %Sat    Component Value Date/Time   IRON 54 12/22/2017 1752   TIBC 214 (L) 12/22/2017 1752   FERRITIN 479 (H) 12/22/2017 1752   IRONPCTSAT 25 12/22/2017 1752    Vitals:   05/22/19 1145 05/22/19 1146 05/22/19 1230 05/22/19 1253  BP: (!) 191/104  (!) 183/81 (!) 189/100  Pulse:  (!) 143 92  Resp:      Temp:    97.7 F (36.5 C)  TempSrc:    Axillary  SpO2:  98% 100%   Weight:      Height:        Exam Gen alert, vomiting, on distress No rash, cyanosis or gangrene Sclera anicteric, throat not seen  No jvd or bruits Chest clear bilat to bases RRR no MRG  Abd soft ntnd no mass or ascites +bs GU normal male MS no joint effusions or deformity Ext no LE edema, no wounds or ulcers Neuro is alert, Ox 3 , nf L fem TDC    Home meds:  - amlodipine 10 qd  - aspirin 81 qd  - pantoprazole 40 bid/ ondansetron prn/ lanthanum ac tid  - prn's/ vitamins/ supplements     [facility] HD: [facility]   4h 39min   81.5kg  2K/ 2.25 bath  400800  Hep none   L thigh TDC     Assessment/ Plan: 1. Nausea/vomiting - don't see any meds implicated. No abd pain, CT abd negative. Per primary.  2. ESRD - on HD TTS x 3 yrs.  Multiple access failures (bilat UE fistulas, ax-ax AVG) have failed, now is TDC dependent w/ new L thigh TDC placed a few days ago, outside facility. HD tomorrow 6/23.  3. Volume - is dry, well under edw 4. HTN - prn IV hydralazine, will place temp clonidine patch while not able to keep pills down 5. H/o DM - no longer on meds it appears 6. H/o CVA 7. H/o cancer  (kidney)      Kelly Splinter  MD 05/22/2019, 1:23 PM

## 2019-05-22 NOTE — ED Notes (Signed)
Tele   Breakfast ordered  

## 2019-05-22 NOTE — ED Notes (Signed)
Pt transitioned into different section. Hospital bed not arrived yet. Bed status reported to nurse taking over.

## 2019-05-22 NOTE — H&P (Signed)
History and Physical    Allen Miller GLO:[id number] DOB: 03/01/1956 DOA: 05/21/2019  PCP: [facility] Patient coming from: Home  Chief Complaint: Nausea and vomiting  HPI: Allen Miller is a 63 y.o. male with medical history significant of end-stage renal disease on HD TRSat, hypertension, CVA presenting to the hospital for evaluation of nausea and vomiting.  Patient states he had a new dialysis catheter placed in his left groin region 2 days ago and received anesthesia for the procedure.  Since after the procedure he has been having nausea and vomiting.  He has not been able to tolerate p.o. intake.  States in the past he has had nausea and vomiting after receiving anesthesia.  Denies any abdominal pain or diarrhea.  He is not having any fevers.  Denies any recent sick contacts.  Denies any chest pain, cough, or shortness of breath.  States he goes for dialysis on Tuesdays, Thursdays, and Saturdays.  His last session was on Saturday, June 20.Allen Miller  ED Course: Afebrile. Not tachycardic. Blood pressure significantly elevated, 226/103 on arrival.  Improved to 175/103 without any antihypertensive.  No leukocytosis.  Absolute lymphocyte count low.  Lactic acid normal.  Hemoglobin 12.6, improved since prior labs.  COVID-19 rapid test pending.  CT showing no acute intra-abdominal or pelvic pathology.  No bowel obstruction or active inflammation.  There is gallbladder sludge or small stones, no evidence of acute cholecystitis. Patient received Reglan, Zofran, and Compazine in the ED.  In addition, a 250 cc fluid bolus was given.  Continued to vomit and admission requested for intractable nausea and vomiting.  Review of Systems:  All systems reviewed and apart from history of presenting illness, are negative.  Past Medical History:  Diagnosis Date  . Cancer [facility])    kidney cancer  . Coffee ground emesis 11/2017  . ESRD (end stage renal disease) ([place])    MWF  . Hemodialysis patient ([place])    . Hypertension   . Low iron   . Shortness of breath dyspnea    when lying flat  . Stroke [facility])    effective vision- Left eye    Past Surgical History:  Procedure Laterality Date  . A/V FISTULAGRAM Right 06/25/2017   Procedure: A/V Fistulagram;  Surgeon: Elam Dutch, MD;  Location: [place] CV LAB;  Service: Cardiovascular;  Laterality: Right;  . AV FISTULA PLACEMENT Left 10/27/2016   Procedure: INSERTION LEFT UPPER ARM  OF ARTERIOVENOUS (AV) GORE-TEX GRAFT ARM;  Surgeon: Conrad [place], MD;  Location: [place];  Service: Vascular;  Laterality: Left;  . AV FISTULA PLACEMENT Right 03/23/2017   Procedure: ARTERIOVENOUS (AV) FISTULA CREATION;  Surgeon: Elam Dutch, MD;  Location: [place];  Service: Vascular;  Laterality: Right;  . BASCILIC VEIN TRANSPOSITION Left 02/24/2016   Procedure: BASILIC VEIN TRANSPOSITION VERSUS ARTERIOVENOUS GRAFT INSERTION;  Surgeon: [name], MD;  Location: [place];  Service: Vascular;  Laterality: Left;  . BASCILIC VEIN TRANSPOSITION Left 07/14/2016   Procedure: LEFT BASILIC VEIN TRANSPOSITION;  Surgeon: Conrad Callaway, MD;  Location: [place];  Service: Vascular;  Laterality: Left;  . BASCILIC VEIN TRANSPOSITION Left 09/08/2016   Procedure: SECOND STAGE BASILIC VEIN TRANSPOSITION LEFT UPPER ARM;  Surgeon: Conrad Latham, MD;  Location: [place];  Service: Vascular;  Laterality: Left;  . ESOPHAGOGASTRODUODENOSCOPY N/A 12/26/2017   Procedure: ESOPHAGOGASTRODUODENOSCOPY (EGD);  Surgeon: Ladene Artist, MD;  Location: [facility] ENDOSCOPY;  Service: Endoscopy;  Laterality: N/A;  . INSERTION OF DIALYSIS CATHETER N/A  02/24/2016   Procedure: INSERTION OF DIALYSIS CATHETER;  Surgeon: [name], MD;  Location: [place];  Service: Vascular;  Laterality: N/A;  . INSERTION OF DIALYSIS CATHETER Right 10/27/2016   Procedure: EXCHANGE OF RIGHT INTERNAL JUGULAR DIALYSIS CATHETER;  Surgeon: Conrad Mutual, MD;  Location: [place];  Service: Vascular;  Laterality: Right;  . IR FLUORO GUIDE CV LINE  RIGHT  07/26/2018  . IR US GUIDE VASC ACCESS RIGHT  07/26/2018  . PERIPHERAL VASCULAR BALLOON ANGIOPLASTY Right 06/25/2017   Procedure: Peripheral Vascular Balloon Angioplasty;  Surgeon: Elam Dutch, MD;  Location: [place] CV LAB;  Service: Cardiovascular;  Laterality: Right;  . THROMBECTOMY AND REVISION OF ARTERIOVENTOUS (AV) GORETEX  GRAFT Left 03/02/2017   Procedure: THROMBECTOMY AND REVISION OF ARTERIOVENous left arm GORETEX  GRAFT;  Surgeon: Elam Dutch, MD;  Location: [place];  Service: Vascular;  Laterality: Left;  . THROMBECTOMY W/ EMBOLECTOMY Left 12/14/2016   Procedure: THROMBECTOMY OF LEFT  ARTERIO-VENOUS GORTEX GRAFT; REVISION OF LEFT  ARTERIO-VENOUS GORE-TEX GRAFT;  Surgeon: [name], MD;  Location: [place];  Service: Vascular;  Laterality: Left;     reports that he has never smoked. He has never used smokeless tobacco. He reports that he does not drink alcohol or use drugs.  Allergies  Allergen Reactions  . Pork-Derived Products Other (See Comments)    NOT AN ALLERGY > RELIGIOUS REASONS  . Ppd [Tuberculin Purified Protein Derivative] Rash    No family history on file.  Prior to Admission medications   Medication Sig Start Date End Date Taking? Authorizing Provider  acetaminophen (TYLENOL) 325 MG tablet Take 2 tablets (650 mg total) by mouth every 6 (six) hours as needed for mild pain, fever or headache. 12/24/17   Mariel Aloe, MD  amLODipine (NORVASC) 5 MG tablet Take 2 tablets (10 mg total) by mouth daily. 07/26/18   Rehman, Areeg N, DO  aspirin 81 MG chewable tablet Chew 81 mg by mouth daily.     [provider]  calcitRIOL (ROCALTROL) 0.5 MCG capsule Take 1 capsule (0.5 mcg total) by mouth every Monday, Wednesday, and Friday with hemodialysis. Patient taking differently: Take 0.5 mcg by mouth Every Tuesday,Thursday,and Saturday with dialysis.  12/27/17   Mariel Aloe, MD  calcium acetate (PHOSLO) 667 MG capsule Take 667 mg by mouth 3 (three) times  daily. 03/02/16   [provider]  calcium carbonate (TUMS EX) 750 MG chewable tablet Chew 2 tablets by mouth as needed for heartburn.    [provider]  Cholecalciferol (VITAMIN D3) 1000 units CAPS Take 2,000 Units by mouth daily.    [provider]  ondansetron (ZOFRAN ODT) 4 MG disintegrating tablet Take 1 tablet (4 mg total) by mouth every 8 (eight) hours as needed for nausea or vomiting. 12/26/17   Mariel Aloe, MD  pantoprazole (PROTONIX) 40 MG tablet Take 1 tablet (40 mg total) by mouth 2 (two) times daily. 12/26/17   Mariel Aloe, MD    Physical Exam: Vitals:   05/21/19 2115 05/22/19 0000 05/22/19 0100 05/22/19 0230  BP: (!) 183/84 (!) 175/103 (!) 145/82 (!) 169/95  Pulse: 96 68  70  Resp: 16 16  18   Temp:      TempSrc:      SpO2: 100% 100%  100%  Weight:      Height:        Physical Exam  Constitutional: He is oriented to person, place, and time. He appears well-developed  and well-nourished. No distress.  HENT:  Head: Normocephalic.  Slightly dry mucous membranes  Eyes: Right eye exhibits no discharge. Left eye exhibits no discharge.  Neck: Neck supple.  Cardiovascular: Normal rate, regular rhythm and intact distal pulses.  Pulmonary/Chest: Effort normal and breath sounds normal. No respiratory distress. He has no wheezes. He has no rales.  Abdominal: Soft. Bowel sounds are normal. He exhibits no distension. There is no abdominal tenderness. There is no rebound and no guarding.  Musculoskeletal:        General: No edema.     Comments: Left groin: Dialysis catheter in place.  No signs of infection.  Neurological: He is alert and oriented to person, place, and time.  Skin: Skin is warm and dry. He is not diaphoretic.     Labs on Admission: I have personally reviewed following labs and imaging studies  CBC: Recent Labs  Lab 05/21/19 2002  WBC 6.2  NEUTROABS 5.5  HGB 12.6*  HCT 38.1*  MCV 86.6  PLT 735   Basic Metabolic Panel:  Recent Labs  Lab 05/21/19 2002  NA 141  K 4.0  CL 95*  CO2 26  GLUCOSE 158*  BUN 44*  CREATININE 10.35*  CALCIUM 9.9   GFR: Estimated Creatinine Clearance: 7.2 mL/min (A) (by C-G formula based on SCr of 10.35 mg/dL (H)). Liver Function Tests: Recent Labs  Lab 05/21/19 2002  AST 21  ALT 26  ALKPHOS 95  BILITOT 1.1  PROT 9.0*  ALBUMIN 4.5   No results for input(s): LIPASE, AMYLASE in the last 168 hours. No results for input(s): AMMONIA in the last 168 hours. Coagulation Profile: No results for input(s): INR, PROTIME in the last 168 hours. Cardiac Enzymes: No results for input(s): CKTOTAL, CKMB, CKMBINDEX, TROPONINI in the last 168 hours. BNP (last 3 results) No results for input(s): PROBNP in the last 8760 hours. HbA1C: No results for input(s): HGBA1C in the last 72 hours. CBG: No results for input(s): GLUCAP in the last 168 hours. Lipid Profile: No results for input(s): CHOL, HDL, LDLCALC, TRIG, CHOLHDL, LDLDIRECT in the last 72 hours. Thyroid Function Tests: No results for input(s): TSH, T4TOTAL, FREET4, T3FREE, THYROIDAB in the last 72 hours. Anemia Panel: No results for input(s): VITAMINB12, FOLATE, FERRITIN, TIBC, IRON, RETICCTPCT in the last 72 hours. Urine analysis:    Component Value Date/Time   COLORURINE YELLOW 01/22/2016 0808   APPEARANCEUR CLEAR 01/22/2016 0808   LABSPEC 1.005 01/22/2016 0808   PHURINE 7.5 01/22/2016 0808   GLUCOSEU NEGATIVE 01/22/2016 0808   HGBUR SMALL (A) 01/22/2016 0808   BILIRUBINUR NEGATIVE 01/22/2016 0808   KETONESUR NEGATIVE 01/22/2016 0808   PROTEINUR >300 (A) 01/22/2016 0808   NITRITE NEGATIVE 01/22/2016 0808   LEUKOCYTESUR NEGATIVE 01/22/2016 0808    Radiological Exams on Admission: Ct Abdomen Pelvis Wo Contrast  Result Date: 05/21/2019 CLINICAL DATA:  63 year old male hemodialysis patient presenting with nausea vomiting and abdominal pain. EXAM: CT ABDOMEN AND PELVIS WITHOUT CONTRAST TECHNIQUE: Multidetector CT  imaging of the abdomen and pelvis was performed following the standard protocol without IV contrast. COMPARISON:  None. FINDINGS: Evaluation of this exam is limited in the absence of intravenous contrast. Lower chest: The visualized lung bases are clear. No intra-abdominal free air or free fluid. Hepatobiliary: The liver is unremarkable. No intrahepatic biliary ductal dilatation. There is sludge or small stones within the gallbladder. No pericholecystic fluid or evidence of acute cholecystitis by CT. Pancreas: Mild fatty infiltration of the head and uncinate process of the pancreas.  The pancreas is otherwise unremarkable. Spleen: Normal in size without focal abnormality. Adrenals/Urinary Tract: Left adrenal thickening/hyperplasia. The right adrenal gland is unremarkable. Possible punctate right renal interpolar calculus. No hydronephrosis. There is no hydronephrosis or nephrolithiasis on the left. 1 cm exophytic hypodense lesion from the upper pole of the left kidney, indeterminate, likely a cyst. The visualized ureters and urinary bladder appear unremarkable. Stomach/Bowel: There is no bowel obstruction or active inflammation. Normal appendix. Vascular/Lymphatic: Mild aortoiliac atherosclerotic disease. Left common femoral approach central venous line with tip in the intrahepatic IVC. Partially visualized catheter with tip at the junction of the SVC/RA. There is no portal venous gas. No adenopathy. Reproductive: Mildly enlarged prostate gland measuring 5 cm in transverse axial diameter. Other: Small fat containing umbilical hernia. No fluid collection. Musculoskeletal: Mild degenerative changes. No acute osseous pathology. IMPRESSION: 1. No acute intra-abdominal or pelvic pathology. No bowel obstruction or active inflammation. Normal appendix. 2. Gallbladder sludge or small stones. No evidence of acute cholecystitis by CT. Electronically Signed   By: Anner Crete M.D.   On: 05/21/2019 23:07     Assessment/Plan Principal Problem:   Viral gastroenteritis Active Problems:   ESRD on dialysis [facility])   Hypertensive urgency   Encounter for screening for HIV   Viral gastroenteritis Presenting with a 2-day history of intractable nausea and vomiting. He received anesthesia 2 days ago, would not explain persistent nausea and vomiting for over 48 hours.  CT showing no bowel obstruction or active inflammation.  Showing gallbladder sludge or small stones, no evidence of acute cholecystitis.  Abdominal exam benign.  Afebrile and no leukocytosis.  Lactic acid normal.  Dialysis catheter insertion site does not appear infected.  COVID-19 rapid test negative. -Appears dehydrated.  Give gentle IV fluid hydration. -IV Zofran PRN  Hypertensive urgency Blood pressure significantly elevated, 226/103 on arrival.  Improved to 175/103 without any antihypertensive. Patient has not been able to take his home medications due to persistent vomiting for the past 2 days. -Continue to monitor blood pressure -Hydralazine PRN  ESRD on HD No signs of volume overload at this time.  Appears dry.  Potassium 4.0 and bicarb 26. -Will need nephrology consultation for routine dialysis  HIV screening The patient falls between the ages of 13-64 and should be screened for HIV, therefore HIV testing ordered.  Unable to safely order home medications at this time as pharmacy medication reconciliation is pending.  DVT prophylaxis: SCDs.  Heparin is a pork derived product.  Patient does not take pork derived products due to religious reasons.  Not able to do Lovenox due to end-stage renal disease. Code Status: Patient wishes to be full code. Family Communication: No family available. Disposition Plan: Anticipate discharge after clinical improvement. Consults called: None Admission status: It is my clinical opinion that referral for OBSERVATION is reasonable and necessary in this patient based on the above information  provided. The aforementioned taken together are felt to place the patient at high risk for further clinical deterioration. However it is anticipated that the patient may be medically stable for discharge from the hospital within 24 to 48 hours.  The medical decision making on this patient was of high complexity and the patient is at high risk for clinical deterioration, therefore this is a level 3 visit.  Shela Leff MD [facility] Pager [phone]  If 7PM-7AM, please contact night-coverage [email or web address] Password [facility]  05/22/2019, 3:54 AM

## 2019-05-22 NOTE — Progress Notes (Signed)
Renal Navigator notified OP HD clinic/[facility] of patient's admission and negative COVID rapid test result in order to provide continuity of care.  Alphonzo Cruise,  Renal Navigator [phone]

## 2019-05-22 NOTE — Progress Notes (Signed)
Patient arrived to unit, had emesis en route.Continues to have vomiting. MD notified. BP 189/100, AOx4

## 2019-05-22 NOTE — ED Notes (Signed)
Family at bedside. 

## 2019-05-22 NOTE — Progress Notes (Addendum)
Patient seen and examined after midnight 6/22 I agree with my partner's assessment and plan 63 year old African American male ESRD TTS-recentPlacement 05/19/2019 left dialysis access in the thigh [@ OP center?]-comes back to the emergency room 6/21 with 1/7 h/oh of  myalgias, fever, diarrhea--predminant symptoms however vomiting--non-bloody emesis  Tells me that his diarrhea is characterized by watery stool-has had episodes of emesis in addition-has not eaten anywhere suspicious or any outside food-no other ill contacts-coronavirus 19 testing was negative  O/e BP (!) 169/95   Pulse 70   Temp 97.9 F (36.6 C) (Oral)   Resp 18   Ht 5\' 5"  (1.651 m)   Wt 76.7 kg   SpO2 100%   BMI 28.12 kg/m  sleepy cta b no rales abd soft L thigh graft well intentitoned, no purulence  Assessment/plan 63 year old male ESRD TTS-probable viral versus C. difficile colitis-we will get gastric pathogen panel given his symptoms and we will reevaluate with the same I have asked nephrology to kindly assist in coordinating dialysis a.m. 6/23 Check labs a.m. Keep on precautions Careful saline repletion Attempt diet

## 2019-05-22 NOTE — ED Notes (Signed)
ED TO INPATIENT HANDOFF REPORT  ED Nurse Name and Phone #: Janine Limbo [phone]  S Name/Age/Gender Allen Miller 63 y.o. male Room/Bed: 058C/058C  Code Status   Code Status: Full Code  Home/SNF/Other Home Patient oriented to: self, place, time and situation Is this baseline? Yes   Triage Complete: Triage complete  Chief Complaint Vomitting, Dizziness  Triage Note Pt c/o vomiting and dizziness since he had a surgical procedure done on Friday. Had a dialysis catheter placed in left leg.   Allergies Allergies  Allergen Reactions  . Pork-Derived Products Other (See Comments)    NOT AN ALLERGY > RELIGIOUS REASONS  . Ppd [Tuberculin Purified Protein Derivative] Rash    Level of Care/Admitting Diagnosis ED Disposition    ED Disposition Condition Comment   Admit  Hospital Area: [place] [[id number]]  Level of Care: Telemetry Medical [104]  Covid Evaluation: Person Under Investigation (PUI)  Isolation Risk Level: Low Risk/Droplet (Less than 4L [place] supplementation)  Diagnosis: Viral gastroenteritis [671245]  Admitting Physician: [place], [place]  Attending Physician: Nita Sells [phone]  Estimated length of stay: past midnight tomorrow  Certification:: I certify this patient will need inpatient services for at least 2 midnights  PT Class (Do Not Modify): Inpatient [101]  PT Acc Code (Do Not Modify): Private [1]       B Medical/Surgery History Past Medical History:  Diagnosis Date  . Cancer [facility])    kidney cancer  . Coffee ground emesis 11/2017  . ESRD (end stage renal disease) ([place])    MWF  . Hemodialysis patient ([place])   . Hypertension   . Low iron   . Shortness of breath dyspnea    when lying flat  . Stroke [facility])    effective vision- Left eye   Past Surgical History:  Procedure Laterality Date  . A/V FISTULAGRAM Right 06/25/2017   Procedure: A/V Fistulagram;  Surgeon: Elam Dutch, MD;  Location: [place] [facility];  Service:  Cardiovascular;  Laterality: Right;  . AV FISTULA PLACEMENT Left 10/27/2016   Procedure: INSERTION LEFT UPPER ARM  OF ARTERIOVENOUS (AV) GORE-TEX GRAFT ARM;  Surgeon: Conrad Strawn, MD;  Location: [place];  Service: Vascular;  Laterality: Left;  . AV FISTULA PLACEMENT Right 03/23/2017   Procedure: ARTERIOVENOUS (AV) FISTULA CREATION;  Surgeon: Elam Dutch, MD;  Location: [place];  Service: Vascular;  Laterality: Right;  . BASCILIC VEIN TRANSPOSITION Left 02/24/2016   Procedure: BASILIC VEIN TRANSPOSITION VERSUS ARTERIOVENOUS GRAFT INSERTION;  Surgeon: [name], MD;  Location: [place];  Service: Vascular;  Laterality: Left;  . BASCILIC VEIN TRANSPOSITION Left 07/14/2016   Procedure: LEFT BASILIC VEIN TRANSPOSITION;  Surgeon: Conrad [place], MD;  Location: [place];  Service: Vascular;  Laterality: Left;  . BASCILIC VEIN TRANSPOSITION Left 09/08/2016   Procedure: SECOND STAGE BASILIC VEIN TRANSPOSITION LEFT UPPER ARM;  Surgeon: Conrad [place], MD;  Location: [place];  Service: Vascular;  Laterality: Left;  . ESOPHAGOGASTRODUODENOSCOPY N/A 12/26/2017   Procedure: ESOPHAGOGASTRODUODENOSCOPY (EGD);  Surgeon: Ladene Artist, MD;  Location: [facility];  Service: Endoscopy;  Laterality: N/A;  . INSERTION OF DIALYSIS CATHETER N/A 02/24/2016   Procedure: INSERTION OF DIALYSIS CATHETER;  Surgeon: [name], MD;  Location: [place];  Service: Vascular;  Laterality: N/A;  . INSERTION OF DIALYSIS CATHETER Right 10/27/2016   Procedure: EXCHANGE OF RIGHT INTERNAL JUGULAR DIALYSIS CATHETER;  Surgeon: Conrad Rhinelander, MD;  Location: [place];  Service: Vascular;  Laterality: Right;  .  IR FLUORO GUIDE CV LINE RIGHT  07/26/2018  . IR US GUIDE VASC ACCESS RIGHT  07/26/2018  . PERIPHERAL VASCULAR BALLOON ANGIOPLASTY Right 06/25/2017   Procedure: Peripheral Vascular Balloon Angioplasty;  Surgeon: Elam Dutch, MD;  Location: [place] [facility];  Service: Cardiovascular;  Laterality: Right;  . THROMBECTOMY AND REVISION OF  ARTERIOVENTOUS (AV) GORETEX  GRAFT Left 03/02/2017   Procedure: THROMBECTOMY AND REVISION OF ARTERIOVENous left arm GORETEX  GRAFT;  Surgeon: Elam Dutch, MD;  Location: [place];  Service: Vascular;  Laterality: Left;  . THROMBECTOMY W/ EMBOLECTOMY Left 12/14/2016   Procedure: THROMBECTOMY OF LEFT  ARTERIO-VENOUS GORTEX GRAFT; REVISION OF LEFT  ARTERIO-VENOUS GORE-TEX GRAFT;  Surgeon: [name], MD;  Location: [place];  Service: Vascular;  Laterality: Left;     A IV Location/Drains/Wounds Patient Lines/Drains/Airways Status   Active Line/Drains/Airways    Name:   Placement date:   Placement time:   Site:   Days:   Peripheral IV 05/21/19 Right Hand   05/21/19    2202    Hand   1   Fistula / Graft Left Forearm Arteriovenous fistula   02/24/16    1402    Forearm   1183   Fistula / Graft Left Other (Comment) Arteriovenous fistula   07/14/16    0824    Other (Comment)   1042   Fistula / Graft Left Upper arm Arteriovenous fistula   09/08/16    1530    Upper arm   986   Fistula / Graft Left Upper arm Arteriovenous vein graft   10/27/16    1119    Upper arm   937   Fistula / Graft Left Upper arm    12/14/16    1321    Upper arm   889   Fistula / Graft Left Upper arm Arteriovenous vein graft   03/02/17    1127    Upper arm   811   Fistula / Graft Left Upper arm Arteriovenous fistula   03/23/17    1103    Upper arm   790   Hemodialysis Catheter Right Internal jugular Double-lumen   -    -    Internal jugular      Hemodialysis Catheter Right Internal jugular Double-lumen;Permanent   07/26/18    1516    Internal jugular   300   Sheath 06/25/17 Right Venous   06/25/17    1138    Venous   696   Incision (Closed) 10/27/16 Chest Right   10/27/16    1154     937   Incision (Closed) 10/27/16 Arm Left   10/27/16    1154     937   Incision (Closed) 12/14/16 Arm Left   12/14/16    1400     889   Incision (Closed) 03/02/17 Arm Left   03/02/17    1045     811   Incision (Closed) 03/23/17 Arm Right   03/23/17     1059     790   Wound / Incision (Open or Dehisced) 12/22/17 Non-pressure wound Leg Right;Lower dry scabbed over   12/22/17    0030    Leg   516          Intake/Output Last 24 hours No intake or output data in the 24 hours ending 05/22/19 1149  Labs/Imaging Results for orders placed or performed during the hospital encounter of 05/21/19 (from the past 48 hour(s))  Lactic acid,  plasma     Status: None   Collection Time: 05/21/19  8:02 PM  Result Value Ref Range   Lactic Acid, Venous 1.7 0.5 - 1.9 mmol/L    Comment: Performed at [facility], [place] [address] [place], [place] [address]  Comprehensive metabolic panel     Status: Abnormal   Collection Time: 05/21/19  8:02 PM  Result Value Ref Range   Sodium 141 135 - 145 mmol/L   Potassium 4.0 3.5 - 5.1 mmol/L   Chloride 95 (L) 98 - 111 mmol/L   CO2 26 22 - 32 mmol/L   Glucose, Bld 158 (H) 70 - 99 mg/dL   BUN 44 (H) 8 - 23 mg/dL   Creatinine, Ser 10.35 (H) 0.61 - 1.24 mg/dL   Calcium 9.9 8.9 - 10.3 mg/dL   Total Protein 9.0 (H) 6.5 - 8.1 g/dL   Albumin 4.5 3.5 - 5.0 g/dL   AST 21 15 - 41 U/L   ALT 26 0 - 44 U/L   Alkaline Phosphatase 95 38 - 126 U/L   Total Bilirubin 1.1 0.3 - 1.2 mg/dL   GFR calc non Af Amer 5 (L) >60 mL/min   GFR calc Af Amer 6 (L) >60 mL/min   Anion gap 20 (H) 5 - 15    Comment: Performed at [facility], [place] [address], [place], [place] [address]  CBC with Differential     Status: Abnormal   Collection Time: 05/21/19  8:02 PM  Result Value Ref Range   WBC 6.2 4.0 - 10.5 K/uL   RBC 4.40 4.22 - 5.81 MIL/uL   Hemoglobin 12.6 (L) 13.0 - 17.0 g/dL   HCT 38.1 (L) 39.0 - 52.0 %   MCV 86.6 80.0 - 100.0 fL   MCH 28.6 26.0 - 34.0 pg   MCHC 33.1 30.0 - 36.0 g/dL   RDW 15.1 11.5 - 15.5 %   Platelets 237 150 - 400 K/uL   nRBC 0.0 0.0 - 0.2 %   Neutrophils Relative % 88 %   Neutro Abs 5.5 1.7 - 7.7 K/uL   Lymphocytes Relative 9 %   Lymphs Abs 0.5 (L) 0.7 - 4.0 K/uL   Monocytes Relative 3 %    Monocytes Absolute 0.2 0.1 - 1.0 K/uL   Eosinophils Relative 0 %   Eosinophils Absolute 0.0 0.0 - 0.5 K/uL   Basophils Relative 0 %   Basophils Absolute 0.0 0.0 - 0.1 K/uL   Immature Granulocytes 0 %   Abs Immature Granulocytes 0.02 0.00 - 0.07 K/uL    Comment: Performed at [facility] [address], [place],  [address]  SARS Coronavirus 2 (CEPHEID - Performed in [place] hospital lab), Hosp Order     Status: None   Collection Time: 05/22/19 12:00 AM   Specimen: Nasopharyngeal Swab  Result Value Ref Range   SARS Coronavirus 2 NEGATIVE NEGATIVE    Comment: (NOTE) If result is NEGATIVE SARS-CoV-2 target nucleic acids are NOT DETECTED. The SARS-CoV-2 RNA is generally detectable in upper and lower  respiratory specimens during the acute phase of infection. The lowest  concentration of SARS-CoV-2 viral copies this assay can detect is 250  copies / mL. A negative result does not preclude SARS-CoV-2 infection  and should not be used as the sole basis for treatment or other  patient management decisions.  A negative result may occur with  improper specimen collection / handling, submission of specimen other  than nasopharyngeal swab, presence  of viral mutation(s) within the  areas targeted by this assay, and inadequate number of viral copies  (<250 copies / mL). A negative result must be combined with clinical  observations, patient history, and epidemiological information. If result is POSITIVE SARS-CoV-2 target nucleic acids are DETECTED. The SARS-CoV-2 RNA is generally detectable in upper and lower  respiratory specimens dur ing the acute phase of infection.  Positive  results are indicative of active infection with SARS-CoV-2.  Clinical  correlation with patient history and other diagnostic information is  necessary to determine patient infection status.  Positive results do  not rule out bacterial infection or co-infection with other viruses. If result is  PRESUMPTIVE POSTIVE SARS-CoV-2 nucleic acids MAY BE PRESENT.   A presumptive positive result was obtained on the submitted specimen  and confirmed on repeat testing.  While 2019 novel coronavirus  (SARS-CoV-2) nucleic acids may be present in the submitted sample  additional confirmatory testing may be necessary for epidemiological  and / or clinical management purposes  to differentiate between  SARS-CoV-2 and other Sarbecovirus currently known to infect humans.  If clinically indicated additional testing with an alternate test  methodology [phone]) is advised. The SARS-CoV-2 RNA is generally  detectable in upper and lower respiratory sp ecimens during the acute  phase of infection. The expected result is Negative. Fact Sheet for Patients:  StrictlyIdeas.no Fact Sheet for Healthcare Providers: BankingDealers.co.za This test is not yet approved or cleared by the [place] FDA and has been authorized for detection and/or diagnosis of SARS-CoV-2 by FDA under an Emergency Use Authorization (EUA).  This EUA will remain in effect (meaning this test can be used) for the duration of the COVID-19 declaration under Section 564(b)(1) of the Act, 21 U.S.C. section 360bbb-3(b)(1), unless the authorization is terminated or revoked sooner. Performed at [facility], [place] [address] [place], [place] [address]   Basic metabolic panel     Status: Abnormal   Collection Time: 05/22/19  3:42 AM  Result Value Ref Range   Sodium 139 135 - 145 mmol/L   Potassium 4.3 3.5 - 5.1 mmol/L   Chloride 96 (L) 98 - 111 mmol/L   CO2 27 22 - 32 mmol/L   Glucose, Bld 132 (H) 70 - 99 mg/dL   BUN 52 (H) 8 - 23 mg/dL   Creatinine, Ser 11.43 (H) 0.61 - 1.24 mg/dL   Calcium 9.1 8.9 - 10.3 mg/dL   GFR calc non Af Amer 4 (L) >60 mL/min   GFR calc Af Amer 5 (L) >60 mL/min   Anion gap 16 (H) 5 - 15    Comment: Performed at [facility] [address]  [place], [place] [address]   Ct Abdomen Pelvis Wo Contrast  Result Date: 05/21/2019 CLINICAL DATA:  63 year old male hemodialysis patient presenting with nausea vomiting and abdominal pain. EXAM: CT ABDOMEN AND PELVIS WITHOUT CONTRAST TECHNIQUE: Multidetector CT imaging of the abdomen and pelvis was performed following the standard protocol without IV contrast. COMPARISON:  None. FINDINGS: Evaluation of this exam is limited in the absence of intravenous contrast. Lower chest: The visualized lung bases are clear. No intra-abdominal free air or free fluid. Hepatobiliary: The liver is unremarkable. No intrahepatic biliary ductal dilatation. There is sludge or small stones within the gallbladder. No pericholecystic fluid or evidence of acute cholecystitis by CT. Pancreas: Mild fatty infiltration of the head and uncinate process of the pancreas. The pancreas is otherwise unremarkable. Spleen: Normal in size without focal abnormality.  Adrenals/Urinary Tract: Left adrenal thickening/hyperplasia. The right adrenal gland is unremarkable. Possible punctate right renal interpolar calculus. No hydronephrosis. There is no hydronephrosis or nephrolithiasis on the left. 1 cm exophytic hypodense lesion from the upper pole of the left kidney, indeterminate, likely a cyst. The visualized ureters and urinary bladder appear unremarkable. Stomach/Bowel: There is no bowel obstruction or active inflammation. Normal appendix. Vascular/Lymphatic: Mild aortoiliac atherosclerotic disease. Left common femoral approach central venous line with tip in the intrahepatic IVC. Partially visualized catheter with tip at the junction of the SVC/RA. There is no portal venous gas. No adenopathy. Reproductive: Mildly enlarged prostate gland measuring 5 cm in transverse axial diameter. Other: Small fat containing umbilical hernia. No fluid collection. Musculoskeletal: Mild degenerative changes. No acute osseous pathology. IMPRESSION: 1. No acute  intra-abdominal or pelvic pathology. No bowel obstruction or active inflammation. Normal appendix. 2. Gallbladder sludge or small stones. No evidence of acute cholecystitis by CT. Electronically Signed   By: Anner Crete M.D.   On: 05/21/2019 23:07    Pending Labs Unresulted Labs (From admission, onward)    Start     Ordered   05/23/19 0500  Renal function panel  Tomorrow morning,   R     05/22/19 0957   05/23/19 0500  CBC with Differential/Platelet  Tomorrow morning,   R     05/22/19 0957   05/22/19 0954  Gastrointestinal Panel by PCR , Stool  (Gastrointestinal Panel by PCR, Stool)  ONCE - STAT,   STAT     05/22/19 0954   05/22/19 0500  HIV antibody (Routine Testing)  Once,   STAT     05/22/19 0128          Vitals/Pain Today's Vitals   05/22/19 0000 05/22/19 0100 05/22/19 0230 05/22/19 0813  BP: (!) 175/103 (!) 145/82 (!) 169/95   Pulse: 68  70   Resp: 16  18   Temp:      TempSrc:      SpO2: 100%  100%   Weight:      Height:      PainSc:    0-No pain    Isolation Precautions Enteric precautions (UV disinfection)  Medications Medications  acetaminophen (TYLENOL) tablet 650 mg (has no administration in time range)    Or  acetaminophen (TYLENOL) suppository 650 mg (has no administration in time range)  0.9 %  sodium chloride infusion ( Intravenous Rate/Dose Change 05/22/19 1031)  ondansetron (ZOFRAN) injection 4 mg (4 mg Intravenous Given 05/22/19 1031)  hydrALAZINE (APRESOLINE) injection 5 mg (has no administration in time range)  ondansetron (ZOFRAN) injection 4 mg (4 mg Intravenous Given 05/21/19 2203)  sodium chloride 0.9 % bolus 250 mL (0 mLs Intravenous Stopped 05/21/19 2318)  prochlorperazine (COMPAZINE) injection 10 mg (10 mg Intravenous Given 05/21/19 2240)    Mobility walks     Focused Assessments    R Recommendations: See Admitting Provider Note  Report given to:   Additional Notes:   s

## 2019-05-23 ENCOUNTER — Inpatient Hospital Stay (HOSPITAL_COMMUNITY): Payer: Medicare Other

## 2019-05-23 DIAGNOSIS — I160 Hypertensive urgency: Principal | ICD-10-CM

## 2019-05-23 LAB — CBC WITH DIFFERENTIAL/PLATELET
Abs Immature Granulocytes: 0.08 10*3/uL — ABNORMAL HIGH (ref 0.00–0.07)
Basophils Absolute: 0 10*3/uL (ref 0.0–0.1)
Basophils Relative: 0 %
Eosinophils Absolute: 0 10*3/uL (ref 0.0–0.5)
Eosinophils Relative: 0 %
HCT: 31 % — ABNORMAL LOW (ref 39.0–52.0)
Hemoglobin: 10.3 g/dL — ABNORMAL LOW (ref 13.0–17.0)
Immature Granulocytes: 1 %
Lymphocytes Relative: 7 %
Lymphs Abs: 0.6 10*3/uL — ABNORMAL LOW (ref 0.7–4.0)
MCH: 28.7 pg (ref 26.0–34.0)
MCHC: 33.2 g/dL (ref 30.0–36.0)
MCV: 86.4 fL (ref 80.0–100.0)
Monocytes Absolute: 0.7 10*3/uL (ref 0.1–1.0)
Monocytes Relative: 9 %
Neutro Abs: 6.4 10*3/uL (ref 1.7–7.7)
Neutrophils Relative %: 83 %
Platelets: 191 10*3/uL (ref 150–400)
RBC: 3.59 MIL/uL — ABNORMAL LOW (ref 4.22–5.81)
RDW: 15.3 % (ref 11.5–15.5)
WBC: 7.8 10*3/uL (ref 4.0–10.5)
nRBC: 0 % (ref 0.0–0.2)

## 2019-05-23 LAB — RENAL FUNCTION PANEL
Albumin: 3.5 g/dL (ref 3.5–5.0)
Anion gap: 17 — ABNORMAL HIGH (ref 5–15)
BUN: 69 mg/dL — ABNORMAL HIGH (ref 8–23)
CO2: 29 mmol/L (ref 22–32)
Calcium: 8.4 mg/dL — ABNORMAL LOW (ref 8.9–10.3)
Chloride: 99 mmol/L (ref 98–111)
Creatinine, Ser: 13.86 mg/dL — ABNORMAL HIGH (ref 0.61–1.24)
GFR calc Af Amer: 4 mL/min — ABNORMAL LOW (ref >60)
GFR calc non Af Amer: 3 mL/min — ABNORMAL LOW (ref >60)
Glucose, Bld: 158 mg/dL — ABNORMAL HIGH (ref 70–99)
Phosphorus: 7.7 mg/dL — ABNORMAL HIGH (ref 2.5–4.6)
Potassium: 3.9 mmol/L (ref 3.5–5.1)
Sodium: 145 mmol/L (ref 135–145)

## 2019-05-23 LAB — LIPASE, BLOOD: Lipase: 27 U/L (ref 11–51)

## 2019-05-23 MED ORDER — ALUM & MAG HYDROXIDE-SIMETH 200-200-20 MG/5ML PO SUSP
30.0000 mL | Freq: Four times a day (QID) | ORAL | Status: DC | PRN
  Administered 2019-05-23: 02:00:00 30 mL via ORAL
  Filled 2019-05-23: qty 30

## 2019-05-23 MED ORDER — MECLIZINE HCL 25 MG PO TABS
25.0000 mg | ORAL_TABLET | Freq: Two times a day (BID) | ORAL | Status: DC
  Administered 2019-05-23 – 2019-05-24 (×3): 25 mg via ORAL
  Filled 2019-05-23 (×4): qty 1

## 2019-05-23 NOTE — Progress Notes (Signed)
[facility] Progress Note  Subjective: seen in room, still nauseous  Vitals:   05/23/19 0417 05/23/19 0500 05/23/19 0849 05/23/19 1151  BP: (!) 190/82  (!) 194/97 (!) 192/83  Pulse: (!) 104 (!) 101 97 98  Resp: 16 16 19 18   Temp: 98.2 F (36.8 C)  98.6 F (37 C) 98.5 F (36.9 C)  TempSrc: Oral  Oral Oral  SpO2: 95% 98% 99% 100%  Weight:      Height:        Inpatient medications: . Chlorhexidine Gluconate Cloth  6 each Topical Q0600  . labetalol  10 mg Intravenous Q6H  . meclizine  25 mg Oral BID    acetaminophen **OR** acetaminophen, alum & mag hydroxide-simeth, hydrALAZINE, ondansetron (ZOFRAN) IV, promethazine    Exam: Gen alert, more calm No jvd or bruits Chest clear bilat to bases RRR no MRG  Abd soft ntnd no mass or ascites +bs Ext no LE edema Neuro is alert, Ox 3 , nf L fem TDC    Home meds:  - amlodipine 10 qd  - aspirin 81 qd  - pantoprazole 40 bid/ ondansetron prn/ lanthanum ac tid  - prn's/ vitamins/ supplements     Outpt HD: [facility]   4h 48min   81.5kg  2K/ 2.25 bath  400800  Hep none   L thigh TDC   Assessment/ Plan: 1. Nausea/vomiting - don't see any meds implicated. No abd pain, CT abd negative. Per primary.  2. ESRD - on HD TTS x 3 yrs.  Multiple accesses (bilat UE fistulas, ax-ax AVG) have failed, now is [facility] dependent w/ new L thigh TDC placed a few days ago. HD today.  3. Volume - looks dry, well under edw 4. HTN - prn IV meds, BP's better controlled 5. H/o DM - no longer on meds 6. H/o CVA 7. H/o cancer (kidney)    [facility] 05/23/2019, 4:00 PM  Iron/TIBC/Ferritin/ %Sat    Component Value Date/Time   IRON 54 12/22/2017 1752   TIBC 214 (L) 12/22/2017 1752   FERRITIN 479 (H) 12/22/2017 1752   IRONPCTSAT 25 12/22/2017 1752   Recent Labs  Lab 05/23/19 0740  NA 145  K 3.9  CL 99  CO2 29  GLUCOSE 158*  BUN 69*  CREATININE 13.86*  CALCIUM 8.4*  PHOS 7.7*  ALBUMIN  3.5   Recent Labs  Lab 05/21/19 2002  AST 21  ALT 26  ALKPHOS 95  BILITOT 1.1  PROT 9.0*   Recent Labs  Lab 05/23/19 0740  WBC 7.8  HGB 10.3*  HCT 31.0*  PLT 191

## 2019-05-23 NOTE — Progress Notes (Signed)
[facility] PROGRESS NOTE  Allen Miller JXB:[id number] DOB: 1956/03/10 DOA: 05/21/2019 PCP: [facility]   Narrative: 63 year old African-American male ESRD TTS X 3 years CVA 2017 Tells me 2 weeks ago he was seen at the [place] because of blurred and double vision as well as nausea vomiting and had an MRI He does not know the results of this he has not been called back He came to emergency room at [facility] with recurrent nausea and vomiting-no sick contacts no other issues COVID testing negative   A & Plan ?  Central vertigo-less likely to have infectious etiology has no fever no white count Stat CT head with contrast to delineate posterior anatomy-add on MRI patient has past-pointing on the left side and he has quadrantanopsia on my exam on left eye-this is probably secondary to a visual deficit and I will rule out stroke as above Nausea vomiting Clearly positional-when patient sits up he vomits-this is greenish nonbloody he has not had diarrhea today Discontinue contact precautions completely I do not think this is infectious Abdominal ultrasound performed 623 [-], LFTs normal, lipase normal Trial meclizine ESRD TTS Defer to nephrology Hypertensive urgency? Follow blood pressures would not aggressively control until imaging is back BMI 28 Outpatient counseling   DVT Lovenox code Status: Full code communication: No family disposition Plan: Inpatient   [name], MD  [facility] [employer] [email or web address] 7PM-7AM contact night coverage as above 05/23/2019, 3:14 PM  LOS: 1 day   Consultants:  None yet  Procedures:  none  Antimicrobials:  nad  Interval history/Subjective: Still nauseous some vomiting no diarrhea no fever no chills no chest pain   Objective:  Vitals:  Vitals:   05/23/19 0849 05/23/19 1151  BP: (!) 194/97 (!) 192/83  Pulse: 97 98  Resp: 19 18  Temp: 98.6 F (37 C) 98.5 F (36.9 C)  SpO2: 99% 100%     Exam:  Awake alert sleepy this morning when I saw him but when I reviewed him he seemed a little bit more awake he still has some dry mucosa He has a cloudy eye on the left side to some degree and he does not have proper visual acuity in the left temporal region of the left eye Finger-nose-finger on the left side is limited Power is 5/5 Lower extremities are soft   I have personally reviewed the following:  DATA   Labs:  BUN/creatinine 69/13 anion gap 17, Phos 7.7  Imaging studies: Ct abd pelv 6/22 1. No acute intra-abdominal or pelvic pathology. No bowel obstruction or active inflammation. Normal appendix. 2. Gallbladder sludge or small stones. No evidence of acute cholecystitis by CT.  [place] abd pelv 6/23 IMPRESSION: 1. No cholelithiasis or sonographic evidence of acute cholecystitis. 2. Bilateral renal atrophy.  Test discussed with performing physician:  nad  Decision to obtain old records:  nad  Review and summation of old records:  reviewed  Scheduled Meds: . Chlorhexidine Gluconate Cloth  6 each Topical Q0600  . labetalol  10 mg Intravenous Q6H  . meclizine  25 mg Oral BID   Continuous Infusions:  Principal Problem:   Viral gastroenteritis Active Problems:   ESRD on dialysis [facility])   Hypertensive urgency   Encounter for screening for HIV   LOS: 1 day

## 2019-05-23 NOTE — Progress Notes (Signed)
PT Cancellation Note  Patient Details Name: Allen Miller MRN: [id number] DOB: 30-Dec-1955   Cancelled Treatment:    Reason Eval/Treat Not Completed: Fatigue/lethargy limiting ability to participate.  Pt is sleeping soundly.  I am unable to awaken him with gentle taps.  Per RN he did not sleep much last night and is still very nauseated.  Thanks,  Barbarann Ehlers. Eliane Hammersmith, PT, DPT  Acute Rehabilitation [phone] pager [phone] office  @ [facility]: [phone]     [name] 05/23/2019, 10:42 AM

## 2019-05-23 NOTE — Consult Note (Signed)
Neurology Consultation Reason for Consult: Nausea and vomiting Referring Physician: Nada Libman  CC: Nausea and vomiting  History is obtained from: Patient  HPI: Allen Miller is a 63 y.o. male he was in his normal state of health until he underwent anesthesia for new dialysis catheter on the 19th and has had persistent nausea and vomiting ever since.  He also has had hiccups.  He denies any vertigo, disequilibrium, new numbness or weakness.  He does have chronic bilateral foot numbness which he states is longstanding.  His nausea semiology is unusual and therefore the possibility of central process has been considered and therefore neurology has been consulted.   LKW: 6/19 tpa given?: no, out of window   ROS: A 14 point ROS was performed and is negative except as noted in the HPI.  Past Medical History:  Diagnosis Date  . Cancer [facility])    kidney cancer  . Coffee ground emesis 11/2017  . ESRD (end stage renal disease) ([place])    MWF  . Hemodialysis patient ([place])   . Hypertension   . Low iron   . Shortness of breath dyspnea    when lying flat  . Stroke [facility])    effective vision- Left eye  . Viral gastroenteritis 05/2019     History reviewed. No pertinent family history.   Social History:  reports that he has never smoked. He has never used smokeless tobacco. He reports that he does not drink alcohol or use drugs.   Exam: Current vital signs: BP (!) 189/97 (BP Location: Right Arm)   Pulse 94   Temp 98.8 F (37.1 C) (Oral)   Resp 16   Ht 5\' 5"  (1.651 m)   Wt 76.7 kg   SpO2 99%   BMI 28.12 kg/m  Vital signs in last 24 hours: Temp:  [98.2 F (36.8 C)-98.8 F (37.1 C)] 98.8 F (37.1 C) (06/23 1638) Pulse Rate:  [88-104] 94 (06/23 1638) Resp:  [13-26] 16 (06/23 1638) BP: (113-223)/(60-104) 189/97 (06/23 1638) SpO2:  [95 %-100 %] 99 % (06/23 1638)   Physical Exam  Constitutional: Appears well-developed and well-nourished.  Psych: Affect appropriate to  situation Eyes: No scleral injection HENT: No OP obstrucion Head: Normocephalic.  Cardiovascular: Normal rate and regular rhythm.  Respiratory: Effort normal, non-labored breathing GI: Soft.  No distension. There is no tenderness.  Skin: WDI  Neuro: Mental Status: Patient is awake, alert, oriented to person, place, month, year, and situation. Patient is able to give a clear and coherent history. No signs of aphasia or neglect Cranial Nerves: II: Visual Fields are full. Pupils are equal, round, and reactive to light.   III,IV, VI: EOMI without ptosis or diploplia.  He does not have any nystagmus in any direction of gaze. V: Facial sensation is symmetric to temperature VII: Facial movement is symmetric.  VIII: hearing is intact to voice X: Uvula elevates symmetrically XI: Shoulder shrug is symmetric. XII: tongue is midline without atrophy or fasciculations.  Motor: He has mild left arm weakness with pronator drift and mild discoordination.  He has good strength on the right and in bilateral legs. Sensory: Sensation is symmetric to light touch and temperature in the arms and legs.  Mildly decreased in the feet bilaterally Deep Tendon Reflexes: 1+ and symmetric in the biceps and absent at the patella Cerebellar: He has some difficulty on finger-nose-finger on the left, with increasing speed he actually has less past-pointing, I suspect that this is consistent with a mild discoordination consistent with  weakness.  I have reviewed labs in epic and the results pertinent to this consultation are: Elevated creatinine and BUN Normal lipase  I have reviewed the images obtained: MRI-multiple old strokes including left parietal and right basal ganglia.  Impression: 63 year old male with nausea and vomiting that started abruptly with a surgical procedure.  I suspect that his left sided deficits are chronic with possible superimposed recrudescence (though this is unclear given that he does not  feel it is worsened).  With negative MRI, and no findings that typically accompany central nausea/vomiting such as vertigo, nystagmus, or other acute symptoms, I think that a central process is markedly less likely.  Other neurological causes of nausea/vomiting(e.g. labyrinthitis, Idamae Schuller syndrome) are also typically accompanied by vertigo.  I wonder if this could be related to uremia, or possibly some lingering effect of anesthesia due to his renal failure.  Hypertensive encephalopathy can sometimes be associated with nausea and vomiting.  With negative MRI, I feel there is no contraindication to aggressive blood pressure control.   Recommendations: 1) MRI had initially been recommended, but was performed at the time of finalizing this note. 2) antiemetics per internal medicine 3) please call if neurology can be of any further assistance.  Roland Rack, MD [facility] [phone]  If 7pm- 7am, please page neurology on call as listed in [place].

## 2019-05-23 NOTE — Evaluation (Addendum)
Physical Therapy Evaluation/Vestibular  Patient Details Name: Allen Miller MRN: [id number] DOB: 1956-11-22 Today's Date: 05/23/2019   History of Present Illness  63 y.o. male admitted on 05/21/19 for N/V since procedure to place new HD catheter in his L groin 2 days prior to admitt.  Since then he has had irretractable N/V.  BPs also significantly elevated.   CT and MRI are pending (suspicious of central cause).  Pt with other significant PMH of ESRD with HD on T Th Sat, stroke, HTN, kidney CA.    Clinical Impression  Pt immediately nauseated and vomiting upon sitting EOB and continued to vomit throughout session (RN notified).  Pt had difficulty tolerating basic vestibular testing with h/o L eye visual problems which have worsened acutely with this episode, and saccadic tracking (which is abnormal for his age).  His symptoms are not consistant with a canal or BPPV issue.  Suspicious for central pathology, especially given L eye vision changes and ataxia in standing.   PT to follow acutely for deficits listed below.     Follow Up Recommendations CIR    Equipment Recommendations  3in1 (PT)    Recommendations for Other Services OT consult;Rehab consult     Precautions / Restrictions Precautions Precautions: Fall Precaution Comments: very unsteady on his feet      Mobility  Bed Mobility Overal bed mobility: Needs Assistance Bed Mobility: Supine to Sit;Sit to Supine     Supine to sit: Min assist Sit to supine: Min assist   General bed mobility comments: Min assist to help pt move to EOB.  Immediately became nauseated and started vomiting.   Transfers Overall transfer level: Needs assistance Equipment used: 1 person hand held assist Transfers: Sit to/from Stand Sit to Stand: Mod assist         General transfer comment: Mod hand held assist to stand, with posterior preference, pt tipped back on his heels with bil lower legs supported by bed.    Ambulation/Gait Ambulation/Gait assistance: Mod assist Gait Distance (Feet): 3 Feet Assistive device: 1 person hand held assist Gait Pattern/deviations: Step-to pattern     General Gait Details: ataic steps up to [facility] with mod hand held assist for balance.       Modified Rankin (Stroke Patients Only) Modified Rankin (Stroke Patients Only) Pre-Morbid Rankin Score: No symptoms Modified Rankin: Moderately severe disability     Balance Overall balance assessment: Needs assistance Sitting-balance support: Feet supported;Bilateral upper extremity supported Sitting balance-Leahy Scale: Fair Sitting balance - Comments: supervision EOB.     Standing balance support: Single extremity supported Standing balance-Leahy Scale: Poor Standing balance comment: needs external support for balance in standing.            05/23/19 1540  Vestibular Assessment  General Observation pt reports spontaneous onset 2 days PTA nausa vomiting, dizziness, but could not describe the dizziness to me, L eye at baseline has visual impairments that he gets injections for, but reports L eye is worse with this episode.  Near constant vomiting when upright. No tinnitus or fullness, no hearing deficits.    Symptom Behavior  Type of Dizziness  Comment;Blurred vision;Imbalance (nausea and vomiting with movement)  Frequency of Dizziness constant when moving  Duration of Dizziness long  Symptom Nature Motion provoked;Positional;Variable  Aggravating Factors Activity in general  Relieving Factors Head stationary;Lying supine;Closing eyes;Rest  Progression of Symptoms No change since onset  Oculomotor Exam  Oculomotor Alignment Normal  Spontaneous Absent  Gaze-induced  Absent  Smooth Pursuits Saccades  Saccades  Dysmetria  Auditory  Comments grossly equal bil  Positional Testing  Dix-Hallpike  (no signs of issues with supine<-> sit transitions) that would indicate need for canal testing at this time. Could  preform at later date if symptoms improve and symptomology aligns more with peripheral issue.                        Pertinent Vitals/Pain Pain Assessment: No/denies pain    Home Living Family/patient expects to be discharged to:: Private residence Living Arrangements: Alone Available Help at Discharge: Family;Available PRN/intermittently(sons live in town) Type of Home: House         Home Equipment: Gilford Rile - 2 wheels;Cane - single point      Prior Function Level of Independence: Independent         Comments: drives himself to HD normally     Hand Dominance   Dominant Hand: Right    Extremity/Trunk Assessment   Upper Extremity Assessment Upper Extremity Assessment: Generalized weakness    Lower Extremity Assessment Lower Extremity Assessment: Generalized weakness    Cervical / Trunk Assessment Cervical / Trunk Assessment: Normal  Communication   Communication: No difficulties  Cognition Arousal/Alertness: Awake/alert Behavior During Therapy: WFL for tasks assessed/performed Overall Cognitive Status: Within Functional Limits for tasks assessed                                 General Comments: Not specifically tested and pt was nauseated and vomiting throughout our session.       General Comments General comments (skin integrity, edema, etc.): Pt reports L eye deficits that were present before this episode, but he reports are worse.  He reports he gets "injections" for them that have not helped.          Assessment/Plan    PT Assessment Patient needs continued PT services  PT Problem List Decreased strength;Decreased activity tolerance;Decreased balance;Decreased mobility;Decreased knowledge of use of DME;Decreased knowledge of precautions;Cardiopulmonary status limiting activity       PT Treatment Interventions DME instruction;Gait training;Stair training;Functional mobility training;Therapeutic activities;Therapeutic  exercise;Balance training;Neuromuscular re-education;Patient/family education    PT Goals (Current goals can be found in the Care Plan section)  Acute Rehab PT Goals Patient Stated Goal: to stop throwing up PT Goal Formulation: With patient Time For Goal Achievement: 06/06/19 Potential to Achieve Goals: Good    Frequency Min 3X/week(change to 4 times per week if stroke confirmed)   Barriers to discharge Decreased caregiver support lives alone       AM-PAC PT "6 Clicks" Mobility  Outcome Measure Help needed turning from your back to your side while in a flat bed without using bedrails?: A Little Help needed moving from lying on your back to sitting on the side of a flat bed without using bedrails?: A Little Help needed moving to and from a bed to a chair (including a wheelchair)?: A Lot Help needed standing up from a chair using your arms (e.g., wheelchair or bedside chair)?: A Lot Help needed to walk in hospital room?: A Lot Help needed climbing 3-5 steps with a railing? : Total 6 Click Score: 13    End of Session   Activity Tolerance: Other (comment)(limited by N/V) Patient left: in bed;with call bell/phone within reach;with bed alarm set Nurse Communication: Mobility status PT Visit Diagnosis: Muscle weakness (generalized) (M62.81);Difficulty in walking, not elsewhere classified (R26.2);Dizziness and giddiness (R42);Other symptoms and signs involving  the nervous system (R29.898)    Time: 7253-6644 PT Time Calculation (min) (ACUTE ONLY): 25 min   Charges:   PT Evaluation $PT Eval Moderate Complexity: 1 Mod PT Treatments $Therapeutic Activity: 8-22 mins   Kameran Mcneese B. Brit Carbonell, PT, DPT  Acute Rehabilitation [phone] pager [phone] office  @ [place]: [phone]       05/23/2019, 4:44 PM

## 2019-05-23 NOTE — Progress Notes (Signed)
2015: Pt BP noted to be 213/108 at 1947; PRN hydralazine given by day RN at 1704, and dose of 5 mg labetalol administered at 1733. On call provider notified of latest BP, orders received. 10 mg hydralazine administered at 2011. BP rechecked at 2117, still elevated at 170/152. On call provider notified again, orders received. Labetalol 10 mg administered at 2205, PRN promethazine also administered for pt's ongoing nausea.  At 2217 BP was lower at 163/79. At 2317, BP was now 125/60, & by 0017 pt's BP was down to 113/60. On call provider notified that RN will hold scheduled MN labetalol due to significant decrease in BP. Pt resting quietly in bed with eyes closed, RR even and unlabored, NAD.

## 2019-05-24 DIAGNOSIS — N186 End stage renal disease: Secondary | ICD-10-CM

## 2019-05-24 DIAGNOSIS — R112 Nausea with vomiting, unspecified: Secondary | ICD-10-CM

## 2019-05-24 DIAGNOSIS — Z992 Dependence on renal dialysis: Secondary | ICD-10-CM

## 2019-05-24 DIAGNOSIS — H81399 Other peripheral vertigo, unspecified ear: Secondary | ICD-10-CM | POA: Diagnosis present

## 2019-05-24 MED ORDER — HEPARIN SODIUM (PORCINE) 1000 UNIT/ML IJ SOLN
INTRAMUSCULAR | Status: AC
  Administered 2019-05-24: 5200 [IU]
  Filled 2019-05-24: qty 4

## 2019-05-24 MED ORDER — AMLODIPINE BESYLATE 10 MG PO TABS
10.0000 mg | ORAL_TABLET | Freq: Every day | ORAL | Status: DC
  Administered 2019-05-24: 13:00:00 10 mg via ORAL
  Filled 2019-05-24: qty 1

## 2019-05-24 MED ORDER — MECLIZINE HCL 25 MG PO TABS: 25.0000 mg | ORAL_TABLET | Freq: Two times a day (BID) | ORAL | 0 refills | Status: AC | PRN

## 2019-05-24 NOTE — Progress Notes (Signed)
Physical Therapy Treatment Patient Details Name: Allen Miller MRN: [id number] DOB: 08/07/1956 Today's Date: 05/24/2019    History of Present Illness 63 y.o. male admitted on 05/21/19 for N/V since procedure to place new HD catheter in his L groin 2 days prior to admitt.  Since then he has had irretractable N/V.  BPs also significantly elevated.   CT and MRI are pending (suspicious of central cause).  Pt with other significant PMH of ESRD with HD on T Th Sat, stroke, HTN, kidney CA.  MRI reveals Chronic left parietal lobe infarct, with additional chronic right basal ganglia.     PT Comments    Pt admitted with above diagnosis. Pt currently with functional limitations due to the deficits listed below (see PT Problem List). Pt was able to ambulate in room without assist.  Pt tested for right hypofunction and gave pt exercise handouts.  Refer to HHPT for vestibular rehab.   Pt will benefit from skilled PT to increase their independence and safety with mobility to allow discharge to the venue listed below.     Follow Up Recommendations  Home health PT;Supervision/Assistance - 24 hour(Vestibular rehab)     Equipment Recommendations  None recommended by PT    Recommendations for Other Services       Precautions / Restrictions Precautions Precautions: Fall Restrictions Weight Bearing Restrictions: No    Mobility  Bed Mobility               General bed mobility comments: seated in recliner chair upon entering the room  Transfers Overall transfer level: Needs assistance Equipment used: None Transfers: Sit to/from Stand Sit to Stand: Supervision         General transfer comment: min A for balance when standing from recliner chair. Min cuing for hand placement  Ambulation/Gait Ambulation/Gait assistance: Min guard Gait Distance (Feet): 50 Feet Assistive device: None Gait Pattern/deviations: Step-through pattern;Decreased stride length   Gait velocity interpretation: 1.31  - 2.62 ft/sec, indicative of limited community ambulator General Gait Details: As long as pt not challenged, did not lose balance.  ABle to walk safely in room and turns without LOB.  Pt asked if he would like RW for ultimate safety as he does feel weaker and pt declines.    Stairs             Wheelchair Mobility    Modified Rankin (Stroke Patients Only) Modified Rankin (Stroke Patients Only) Pre-Morbid Rankin Score: No symptoms Modified Rankin: Moderately severe disability     Balance Overall balance assessment: Needs assistance Sitting-balance support: No upper extremity supported;Feet supported Sitting balance-Leahy Scale: Fair     Standing balance support: No upper extremity supported;During functional activity Standing balance-Leahy Scale: Fair Standing balance comment: did not need UE support for static or min dynamic activity                            Cognition Arousal/Alertness: Awake/alert Behavior During Therapy: WFL for tasks assessed/performed Overall Cognitive Status: Within Functional Limits for tasks assessed                                        Exercises Other Exercises Other Exercises: Gave pt HEP [id number].  x1 exericises and eye exercises.     General Comments General comments (skin integrity, edema, etc.): Pt appears to have right hypofunction per testing.  Pertinent Vitals/Pain Pain Assessment: No/denies pain    Home Living Family/patient expects to be discharged to:: Private residence Living Arrangements: Children Available Help at Discharge: Family;Available 24 hours/day Type of Home: House Home Access: Stairs to enter Entrance Stairs-Rails: Can reach both Home Layout: One level Home Equipment: Walker - 2 wheels;Cane - single point Additional Comments: Pt planning on staying with his son at discharge    Prior Function Level of Independence: Independent      Comments: drives himself to HD normally    PT Goals (current goals can now be found in the care plan section) Acute Rehab PT Goals Patient Stated Goal: to move better Progress towards PT goals: Progressing toward goals    Frequency    Min 3X/week      PT Plan Current plan remains appropriate    Co-evaluation              AM-PAC PT "6 Clicks" Mobility   Outcome Measure  Help needed turning from your back to your side while in a flat bed without using bedrails?: None Help needed moving from lying on your back to sitting on the side of a flat bed without using bedrails?: None Help needed moving to and from a bed to a chair (including a wheelchair)?: A Little Help needed standing up from a chair using your arms (e.g., wheelchair or bedside chair)?: A Little Help needed to walk in hospital room?: A Little Help needed climbing 3-5 steps with a railing? : A Little 6 Click Score: 20    End of Session Equipment Utilized During Treatment: Gait belt Activity Tolerance: Patient limited by fatigue Patient left: in chair;with call bell/phone within reach Nurse Communication: Mobility status PT Visit Diagnosis: Muscle weakness (generalized) (M62.81);Difficulty in walking, not elsewhere classified (R26.2);Dizziness and giddiness (R42);Other symptoms and signs involving the nervous system (R29.898)     Time: 7035-0093 PT Time Calculation (min) (ACUTE ONLY): 21 min  Charges:  $Gait Training: 8-22 mins                     [place] Pager:  [phone]  Office:  [place] 05/24/2019, 3:54 PM

## 2019-05-24 NOTE — Discharge Summary (Signed)
Physician Discharge Summary  Allen Miller SKA:[id number] DOB: [birth year] DOA: 05/21/2019  PCP: [facility]  Admit date: 05/21/2019 Discharge date: 05/24/2019  Admitted From: Home Disposition: Home  Recommendations for Outpatient Follow-up:  1. Follow up with PCP in 1-2 weeks 2. Please adjust blood pressure meds during dialysis if patient still hypertensive.  Home Health: None Equipment/Devices: None  Discharge Condition: Fair CODE STATUS: Full code Diet recommendation: Renal    Discharge Diagnoses:  Principal Problem:   Peripheral vertigo  Active Problems:   ESRD on dialysis ([place])   Viral gastroenteritis   Hypertensive urgency   Encounter for screening for HIV  Brief narrative/HPI 63-year-old male with ESRD on hemodialysis, history of stroke in 2017 presented with recurrent nausea and vomiting.  He reported being seen at the [facility] for blurred vision/double vision associated nausea and vomiting.  He had an MRI but does not know the result.  He had a new dialysis catheter placed (under anesthesia) on 6/19 following which he started having the symptoms.   In the ED vitals were stable.  CT abdomen pelvis was unremarkable.  He was tested negative for COVID.  Placed in observation for acute vertigo.  Hospital course Acute vertigo Head CT unremarkable.  MRI of the brain negative for acute finding.  Possibly secondary to hypertensive urgency versus uremia versus benign positional vertigo versus postanesthesia effect.  Does not appear central neurological process. Patient placed in observation and given antiemetics and scheduled meclizine. This morning he has been able to ambulate to the bathroom.  He was seen during dialysis and denies further nausea or vomiting.  He tolerated diet after returning from dialysis. Stable to be discharged home.  I will prescribe him a short course of PRN meclizine.  ESRD on hemodialysis Receive dialysis today.  Uncontrolled  hypertension Elevated blood pressure.  This could have contributed to his symptoms.  He is on amlodipine 10 mg daily which is resumed.  His blood pressure needs to be checked as outpatient and another blood pressure medication added if remains elevated.   Procedure: CT head, MRI brain Consult: Neurology/nephrology Family communication: None at bedside Disposition: Home   Discharge Instructions   Allergies as of 05/24/2019      Reactions   Pork-derived Products Other (See Comments)   NOT AN ALLERGY > RELIGIOUS REASONS   Ppd [tuberculin Purified Protein Derivative] Rash      Medication List    STOP taking these medications   pantoprazole 40 MG tablet Commonly known as: PROTONIX     TAKE these medications   acetaminophen 325 MG tablet Commonly known as: TYLENOL Take 2 tablets (650 mg total) by mouth every 6 (six) hours as needed for mild pain, fever or headache.   amLODipine 5 MG tablet Commonly known as: NORVASC Take 2 tablets (10 mg total) by mouth daily.   aspirin 81 MG chewable tablet Chew 81 mg by mouth daily.   brimonidine 0.2 % ophthalmic solution Commonly known as: ALPHAGAN Place 1 drop into the left eye 3 (three) times daily.   calcitRIOL 0.5 MCG capsule Commonly known as: ROCALTROL Take 1 capsule (0.5 mcg total) by mouth every Monday, Wednesday, and Friday with hemodialysis. What changed: when to take this   calcium carbonate 750 MG chewable tablet Commonly known as: TUMS EX Chew 2 tablets by mouth as needed for heartburn.   lanthanum 500 MG chewable tablet Commonly known as: FOSRENOL Chew 500 mg by mouth 3 (three) times daily with meals.   meclizine 25  MG tablet Commonly known as: ANTIVERT Take 1 tablet (25 mg total) by mouth 2 (two) times daily as needed for dizziness.   ondansetron 4 MG disintegrating tablet Commonly known as: Zofran ODT Take 1 tablet (4 mg total) by mouth every 8 (eight) hours as needed for nausea or vomiting.      Follow-up  Information    Cl[facility], [facility] Follow up in 2 week(s).   Contact information: [place] [address] [phone]          Allergies  Allergen Reactions  . Pork-Derived Products Other (See Comments)    NOT AN ALLERGY > RELIGIOUS REASONS  . Ppd [Tuberculin Purified Protein Derivative] Rash      Procedures/Studies: Ct Abdomen Pelvis Wo Contrast  Result Date: 05/21/2019 CLINICAL DATA:  63 year old male hemodialysis patient presenting with nausea vomiting and abdominal pain. EXAM: CT ABDOMEN AND PELVIS WITHOUT CONTRAST TECHNIQUE: Multidetector CT imaging of the abdomen and pelvis was performed following the standard protocol without IV contrast. COMPARISON:  None. FINDINGS: Evaluation of this exam is limited in the absence of intravenous contrast. Lower chest: The visualized lung bases are clear. No intra-abdominal free air or free fluid. Hepatobiliary: The liver is unremarkable. No intrahepatic biliary ductal dilatation. There is sludge or small stones within the gallbladder. No pericholecystic fluid or evidence of acute cholecystitis by CT. Pancreas: Mild fatty infiltration of the head and uncinate process of the pancreas. The pancreas is otherwise unremarkable. Spleen: Normal in size without focal abnormality. Adrenals/Urinary Tract: Left adrenal thickening/hyperplasia. The right adrenal gland is unremarkable. Possible punctate right renal interpolar calculus. No hydronephrosis. There is no hydronephrosis or nephrolithiasis on the left. 1 cm exophytic hypodense lesion from the upper pole of the left kidney, indeterminate, likely a cyst. The visualized ureters and urinary bladder appear unremarkable. Stomach/Bowel: There is no bowel obstruction or active inflammation. Normal appendix. Vascular/Lymphatic: Mild aortoiliac atherosclerotic disease. Left common femoral approach central venous line with tip in the intrahepatic IVC. Partially visualized  catheter with tip at the junction of the SVC/RA. There is no portal venous gas. No adenopathy. Reproductive: Mildly enlarged prostate gland measuring 5 cm in transverse axial diameter. Other: Small fat containing umbilical hernia. No fluid collection. Musculoskeletal: Mild degenerative changes. No acute osseous pathology. IMPRESSION: 1. No acute intra-abdominal or pelvic pathology. No bowel obstruction or active inflammation. Normal appendix. 2. Gallbladder sludge or small stones. No evidence of acute cholecystitis by CT. Electronically Signed   By: Anner Crete M.D.   On: 05/21/2019 23:07   Ct Head Wo Contrast  Result Date: 05/23/2019 CLINICAL DATA:  Blurred vision x2 weeks EXAM: CT HEAD WITHOUT CONTRAST TECHNIQUE: Contiguous axial images were obtained from the base of the skull through the vertex without intravenous contrast. COMPARISON:  CT head dated January 21, 2016. FINDINGS: Brain: No evidence of acute infarction, hemorrhage, hydrocephalus, extra-axial collection or mass lesion/mass effect. An old left parietal infarct is noted. There is an old right basal ganglia infarct. There is mild age related volume loss and chronic microvascular ischemic changes. Vascular: No hyperdense vessel or unexpected calcification. Skull: Normal. Negative for fracture or focal lesion. Sinuses/Orbits: No acute finding. Other: None. IMPRESSION: No acute intracranial abnormality. Electronically Signed   By: Constance Holster M.D.   On: 05/23/2019 19:00   Mr Brain Wo Contrast  Result Date: 05/23/2019 CLINICAL DATA:  Initial evaluation for acute dizziness.  Stroke. EXAM: MRI HEAD WITHOUT CONTRAST TECHNIQUE: Multiplanar, multiecho pulse sequences of the brain and surrounding structures were  obtained without intravenous contrast. COMPARISON:  Prior head CT from earlier the same day. FINDINGS: Brain: Generalized age-related cerebral volume loss. Patchy and confluent T2/FLAIR hyperintensity within the periventricular deep  white matter both cerebral hemispheres, most consistent with chronic small vessel ischemic disease, moderate nature. Encephalomalacia and gliosis within the posterior left parietal lobe compatible with chronic ischemic infarct. Remote lacunar infarcts present at the right basal ganglia and left thalamus. Associated small amount of chronic hemosiderin staining. No abnormal foci of restricted diffusion to suggest acute or subacute ischemia. Gray-white matter differentiation maintained. No other areas of chronic infarction. No evidence for acute intracranial hemorrhage. No mass lesion, midline shift or mass effect. No hydrocephalus. No extra-axial fluid collection. Pituitary gland within normal limits. Midline structures intact. Vascular: Major intracranial vascular flow voids are maintained. Skull and upper cervical spine: Craniocervical junction within normal limits. Upper cervical spine normal. Bone marrow signal intensity within normal limits. No scalp soft tissue abnormality. Sinuses/Orbits: Globes and orbital soft tissues within normal limits. Paranasal sinuses are largely clear. No mastoid effusion. Inner ear structures grossly normal. Other: None. IMPRESSION: 1. No acute intracranial abnormality. 2. Chronic left parietal lobe infarct, with additional chronic lacunar infarcts involving the right basal ganglia and left thalamus. 3. Underlying moderate chronic microvascular ischemic disease. Electronically Signed   By: Jeannine Boga M.D.   On: 05/23/2019 19:36   US Abdomen Complete  Result Date: 05/23/2019 CLINICAL DATA:  Cholecystitis, pain EXAM: ABDOMEN ULTRASOUND COMPLETE COMPARISON:  None. FINDINGS: Gallbladder: No gallstones or wall thickening visualized. No sonographic Murphy sign noted by sonographer. Common bile duct: Diameter: 4.2 mm Liver: No focal lesion identified. Within normal limits in parenchymal echogenicity. Portal vein is patent on color Doppler imaging with normal direction of blood  flow towards the liver. IVC: No abnormality visualized. Pancreas: Visualized portion unremarkable. Spleen: Size and appearance within normal limits. Right Kidney: Length: 7.9 cm. Renal cortical thinning. Increased renal cortical echogenicity. No mass or hydronephrosis visualized. Left Kidney: Length: 8.4 cm. Renal cortical thinning. Increased renal cortical echogenicity. 9 mm anechoic left renal mass consistent with a cyst. No hydronephrosis visualized. Abdominal aorta: No aneurysm visualized. Other findings: None. IMPRESSION: 1. No cholelithiasis or sonographic evidence of acute cholecystitis. 2. Bilateral renal atrophy. Electronically Signed   By: Kathreen Devoid   On: 05/23/2019 09:09      Subjective: Seen during dialysis.  Denies further nausea vomiting and been able to ambulate without difficulty.  Discharge Exam: Vitals:   05/24/19 1251 05/24/19 1306  BP: (!) 196/110 137/83  Pulse:  78  Resp:  17  Temp: (!) 97.5 F (36.4 C)   SpO2:  97%   Vitals:   05/24/19 1200 05/24/19 1205 05/24/19 1251 05/24/19 1306  BP: (!) 198/94 (!) 190/91 (!) 196/110 137/83  Pulse: 86 86  78  Resp: 16 16  17   Temp:   (!) 97.5 F (36.4 C)   TempSrc:   Oral   SpO2:    97%  Weight:      Height:        General: Not in distress HEENT: Moist mucosa, supple neck Chest: Clear bilaterally CVs: Normal S1-S2 GI: Soft, nondistended, nontender Musculoskeletal: Left femoral HD access CNS: Alert and oriented, nonfocal    The results of significant diagnostics from this hospitalization (including imaging, microbiology, ancillary and laboratory) are listed below for reference.     Microbiology: Recent Results (from the past 240 hour(s))  SARS Coronavirus 2 (CEPHEID - Performed in [place] hospital lab), [facility]  Status: None   Collection Time: 05/22/19 12:00 AM   Specimen: Nasopharyngeal Swab  Result Value Ref Range Status   SARS Coronavirus 2 NEGATIVE NEGATIVE Final    Comment: (NOTE) If result  is NEGATIVE SARS-CoV-2 target nucleic acids are NOT DETECTED. The SARS-CoV-2 RNA is generally detectable in upper and lower  respiratory specimens during the acute phase of infection. The lowest  concentration of SARS-CoV-2 viral copies this assay can detect is 250  copies / mL. A negative result does not preclude SARS-CoV-2 infection  and should not be used as the sole basis for treatment or other  patient management decisions.  A negative result may occur with  improper specimen collection / handling, submission of specimen other  than nasopharyngeal swab, presence of viral mutation(s) within the  areas targeted by this assay, and inadequate number of viral copies  (<250 copies / mL). A negative result must be combined with clinical  observations, patient history, and epidemiological information. If result is POSITIVE SARS-CoV-2 target nucleic acids are DETECTED. The SARS-CoV-2 RNA is generally detectable in upper and lower  respiratory specimens dur ing the acute phase of infection.  Positive  results are indicative of active infection with SARS-CoV-2.  Clinical  correlation with patient history and other diagnostic information is  necessary to determine patient infection status.  Positive results do  not rule out bacterial infection or co-infection with other viruses. If result is PRESUMPTIVE POSTIVE SARS-CoV-2 nucleic acids MAY BE PRESENT.   A presumptive positive result was obtained on the submitted specimen  and confirmed on repeat testing.  While 2019 novel coronavirus  (SARS-CoV-2) nucleic acids may be present in the submitted sample  additional confirmatory testing may be necessary for epidemiological  and / or clinical management purposes  to differentiate between  SARS-CoV-2 and other Sarbecovirus currently known to infect humans.  If clinically indicated additional testing with an alternate test  methodology [phone]) is advised. The SARS-CoV-2 RNA is generally   detectable in upper and lower respiratory sp ecimens during the acute  phase of infection. The expected result is Negative. Fact Sheet for Patients:  StrictlyIdeas.no Fact Sheet for Healthcare Providers: BankingDealers.co.za This test is not yet approved or cleared by the [place] FDA and has been authorized for detection and/or diagnosis of SARS-CoV-2 by FDA under an Emergency Use Authorization (EUA).  This EUA will remain in effect (meaning this test can be used) for the duration of the COVID-19 declaration under Section 564(b)(1) of the Act, 21 U.S.C. section 360bbb-3(b)(1), unless the authorization is terminated or revoked sooner. Performed at [facility], [place] [address] [place],  [address]      Labs: BNP (last 3 results) No results for input(s): BNP in the last 8760 hours. Basic Metabolic Panel: Recent Labs  Lab 05/21/19 2002 05/22/19 0342 05/23/19 0740  NA 141 139 145  K 4.0 4.3 3.9  CL 95* 96* 99  CO2 26 27 29   GLUCOSE 158* 132* 158*  BUN 44* 52* 69*  CREATININE 10.35* 11.43* 13.86*  CALCIUM 9.9 9.1 8.4*  PHOS  --   --  7.7*   Liver Function Tests: Recent Labs  Lab 05/21/19 2002 05/23/19 0740  AST 21  --   ALT 26  --   ALKPHOS 95  --   BILITOT 1.1  --   PROT 9.0*  --   ALBUMIN 4.5 3.5   Recent Labs  Lab 05/23/19 0740  LIPASE 27   No results for input(s): AMMONIA  in the last 168 hours. CBC: Recent Labs  Lab 05/21/19 2002 05/23/19 0740  WBC 6.2 7.8  NEUTROABS 5.5 6.4  HGB 12.6* 10.3*  HCT 38.1* 31.0*  MCV 86.6 86.4  PLT 237 191   Cardiac Enzymes: No results for input(s): CKTOTAL, CKMB, CKMBINDEX, TROPONINI in the last 168 hours. BNP: Invalid input(s): POCBNP CBG: No results for input(s): GLUCAP in the last 168 hours. D-Dimer No results for input(s): DDIMER in the last 72 hours. Hgb A1c No results for input(s): HGBA1C in the last 72 hours. Lipid Profile No results for  input(s): CHOL, HDL, LDLCALC, TRIG, CHOLHDL, LDLDIRECT in the last 72 hours. Thyroid function studies No results for input(s): TSH, T4TOTAL, T3FREE, THYROIDAB in the last 72 hours.  Invalid input(s): FREET3 Anemia work up No results for input(s): VITAMINB12, FOLATE, FERRITIN, TIBC, IRON, RETICCTPCT in the last 72 hours. Urinalysis    Component Value Date/Time   COLORURINE YELLOW 01/22/2016 0808   APPEARANCEUR CLEAR 01/22/2016 0808   LABSPEC 1.005 01/22/2016 0808   PHURINE 7.5 01/22/2016 0808   GLUCOSEU NEGATIVE 01/22/2016 0808   HGBUR SMALL (A) 01/22/2016 0808   BILIRUBINUR NEGATIVE 01/22/2016 0808   KETONESUR NEGATIVE 01/22/2016 0808   PROTEINUR >300 (A) 01/22/2016 0808   NITRITE NEGATIVE 01/22/2016 0808   LEUKOCYTESUR NEGATIVE 01/22/2016 0808   Sepsis Labs Invalid input(s): PROCALCITONIN,  WBC,  LACTICIDVEN Microbiology Recent Results (from the past 240 hour(s))  SARS Coronavirus 2 (CEPHEID - Performed in [place] hospital lab), H[facility] Order     Status: None   Collection Time: 05/22/19 12:00 AM   Specimen: Nasopharyngeal Swab  Result Value Ref Range Status   SARS Coronavirus 2 NEGATIVE NEGATIVE Final    Comment: (NOTE) If result is NEGATIVE SARS-CoV-2 target nucleic acids are NOT DETECTED. The SARS-CoV-2 RNA is generally detectable in upper and lower  respiratory specimens during the acute phase of infection. The lowest  concentration of SARS-CoV-2 viral copies this assay can detect is 250  copies / mL. A negative result does not preclude SARS-CoV-2 infection  and should not be used as the sole basis for treatment or other  patient management decisions.  A negative result may occur with  improper specimen collection / handling, submission of specimen other  than nasopharyngeal swab, presence of viral mutation(s) within the  areas targeted by this assay, and inadequate number of viral copies  (<250 copies / mL). A negative result must be combined with clinical   observations, patient history, and epidemiological information. If result is POSITIVE SARS-CoV-2 target nucleic acids are DETECTED. The SARS-CoV-2 RNA is generally detectable in upper and lower  respiratory specimens dur ing the acute phase of infection.  Positive  results are indicative of active infection with SARS-CoV-2.  Clinical  correlation with patient history and other diagnostic information is  necessary to determine patient infection status.  Positive results do  not rule out bacterial infection or co-infection with other viruses. If result is PRESUMPTIVE POSTIVE SARS-CoV-2 nucleic acids MAY BE PRESENT.   A presumptive positive result was obtained on the submitted specimen  and confirmed on repeat testing.  While 2019 novel coronavirus  (SARS-CoV-2) nucleic acids may be present in the submitted sample  additional confirmatory testing may be necessary for epidemiological  and / or clinical management purposes  to differentiate between  SARS-CoV-2 and other Sarbecovirus currently known to infect humans.  If clinically indicated additional testing with an alternate test  methodology [phone]) is advised. The SARS-CoV-2 RNA is generally  detectable  in upper and lower respiratory sp ecimens during the acute  phase of infection. The expected result is Negative. Fact Sheet for Patients:  StrictlyIdeas.no Fact Sheet for Healthcare Providers: BankingDealers.co.za This test is not yet approved or cleared by the [place] FDA and has been authorized for detection and/or diagnosis of SARS-CoV-2 by FDA under an Emergency Use Authorization (EUA).  This EUA will remain in effect (meaning this test can be used) for the duration of the COVID-19 declaration under Section 564(b)(1) of the Act, 21 U.S.C. section 360bbb-3(b)(1), unless the authorization is terminated or revoked sooner. Performed at [facility], [place] [address],  [place], [place] [address]      Time coordinating discharge: < 30 minutes  SIGNED:   Louellen Molder, MD  [facility] 05/24/2019, 2:30 PM Pager   If 7PM-7AM, please contact night-coverage [email or web address] Password [email or web address]

## 2019-05-24 NOTE — Progress Notes (Signed)
Discharge instructions given. Pt verbalized understanding and all questions were answered.  

## 2019-05-24 NOTE — Progress Notes (Signed)
   05/24/19 1516  Subjective Data  Patient Stated Goal to move better   PT - Assessment/Plan  Follow Up Recommendations Home health PT;Supervision/Assistance - 24 hour (Vestibular rehab)  PT equipment None recommended by PT  Assessed further for vertigo.  Pt suspicious for right hypofunction.  Gave pt x1 exercises and eye exercises and rrecommend HHPT for vestibular rehab.  Full note to follow.   [place] Pager:  [phone]  Office:  [phone]

## 2019-05-24 NOTE — Progress Notes (Addendum)
[facility] Progress Note  Subjective: Had HD this morning. Bps high post HD. Sitting up in recliner. No vomiting/nausea today   Vitals:   05/24/19 1100 05/24/19 1130 05/24/19 1200 05/24/19 1205  BP: (!) 181/83 (!) 167/57 (!) 198/94 (!) 190/91  Pulse: 91 79 86 86  Resp: 13 13 16 16   Temp:      TempSrc:      SpO2:      Weight:      Height:        Inpatient medications: . amLODipine  10 mg Oral Daily  . Chlorhexidine Gluconate Cloth  6 each Topical Q0600  . labetalol  10 mg Intravenous Q6H  . meclizine  25 mg Oral BID    acetaminophen **OR** acetaminophen, alum & mag hydroxide-simeth, hydrALAZINE, ondansetron (ZOFRAN) IV, promethazine    Exam: Gen alert, more calm No jvd or bruits Chest clear bilat to bases Irregular  no MRG  Abd soft ntnd no mass or ascites +bs Ext no LE edema Neuro is alert, Ox 3 , nf L fem TDC    Home meds:  - amlodipine 10 qd  - aspirin 81 qd  - pantoprazole 40 bid/ ondansetron prn/ lanthanum ac tid  - prn's/ vitamins/ supplements    Outpt HD: [facility]   4h 51min   81.5kg  2K/ 2.25 bath  400800  Hep none   L thigh TDC   Assessment/ Plan: 1. Nausea/vomiting - don't see any meds implicated. No abd pain, CT abd negative. Per primary. Evaluated by neuro- not felt to be central process. Improved today.  2. ESRD - on HD TTS x 3 yrs.  Multiple accesses (bilat UE fistulas, ax-ax AVG) have failed, now is [facility] dependent w/ new L thigh TDC placed a few days ago. Had HD off schedule today. Short HD tomorrow to get back on schedule.  3. Volume - looks dry, well under edw 4. HTN - prn IV meds. BPs high here. Only on amlodipine 10 as outpatient  5. H/o DM - no longer on meds 6. H/o CVA 7. H/o cancer (kidney)  Lynnda Child PA-C [facility] Pager [phone] 05/24/2019,12:53 PM  Pt seen, examined and agree w A/P as above. Improving.  Kelly Splinter  MD 05/24/2019, 3:23 PM     Iron/TIBC/Ferritin/  %Sat  Recent Labs  Lab 05/23/19 0740  NA 145  K 3.9  CL 99  CO2 29  GLUCOSE 158*  BUN 69*  CREATININE 13.86*  CALCIUM 8.4*  PHOS 7.7*  ALBUMIN 3.5   Recent Labs  Lab 05/21/19 2002  AST 21  ALT 26  ALKPHOS 95  BILITOT 1.1  PROT 9.0*   Recent Labs  Lab 05/23/19 0740  WBC 7.8  HGB 10.3*  HCT 31.0*  PLT 191

## 2019-05-24 NOTE — Progress Notes (Signed)
PT Cancellation Note  Patient Details Name: Allen Miller MRN: [id number] DOB: 09/09/1956   Cancelled Treatment:    Reason Eval/Treat Not Completed: Patient at procedure or test/unavailable Pt at dialysis. Acute PT to return as able to assess function.  Kittie Plater, PT, DPT Acute Rehabilitation Services Pager #: [phone] Office #: [phone]    [place] Lopes 05/24/2019, 11:32 AM

## 2019-05-24 NOTE — Evaluation (Signed)
Occupational Therapy Evaluation Patient Details Name: Allen Miller MRN: [id number] DOB: 25-Dec-1955 Today's Date: 05/24/2019    History of Present Illness 63 y.o. male admitted on 05/21/19 for N/V since procedure to place new HD catheter in his L groin 2 days prior to admitt.  Since then he has had irretractable N/V.  BPs also significantly elevated.   CT and MRI are pending (suspicious of central cause).  Pt with other significant PMH of ESRD with HD on T Th Sat, stroke, HTN, kidney CA.     Clinical Impression   Patient presenting with decreased I in self care, balance, functional mobility/transfers, endurance, strength, and safety awareness. Patient reports independent PTA without use of AD. Patient currently functioning at min A overall. Patient will benefit from acute OT to increase overall independence in the areas of ADLs, functional mobility, and safety awareness in order to safely discharge home with caregivers. Pt standing at sink with S - min guard for balance during self care tasks and ambulating 100' with min HHA. Pt requesting to discharge home to son's house where he will have 24/7 supervision per his report. Pt declines CIR at this time.     Follow Up Recommendations  Home health OT;Supervision/Assistance - 24 hour    Equipment Recommendations  Tub/shower seat    Recommendations for Other Services (none at this time)     Precautions / Restrictions Precautions Precautions: Fall Restrictions Weight Bearing Restrictions: No      Mobility Bed Mobility    General bed mobility comments: seated in recliner chair upon entering the room  Transfers Overall transfer level: Needs assistance Equipment used: 1 person hand held assist Transfers: Sit to/from Stand Sit to Stand: Min assist         General transfer comment: min A for balance when standing from recliner chair. Min cuing for hand placement    Balance Overall balance assessment: Needs  assistance Sitting-balance support: Feet supported;Bilateral upper extremity supported Sitting balance-Leahy Scale: Good     Standing balance support: Single extremity supported Standing balance-Leahy Scale: Fair Standing balance comment: min hand held assistance with gait          ADL either performed or assessed with clinical judgement   ADL Overall ADL's : Needs assistance/impaired Eating/Feeding: Independent   Grooming: Wash/dry hands;Wash/dry face;Oral care;Standing;Min guard   Upper Body Bathing: Set up;Sitting   Lower Body Bathing: Min guard;Minimal assistance;Sit to/from stand   Upper Body Dressing : Set up;Sitting   Lower Body Dressing: Minimal assistance;Sit to/from stand   Toilet Transfer: Minimal assistance            Vision Baseline Vision/History: Wears glasses Wears Glasses: At all times(does not have with him on this admission) Patient Visual Report: No change from baseline              Pertinent Vitals/Pain Pain Assessment: No/denies pain     Hand Dominance Right   Extremity/Trunk Assessment Upper Extremity Assessment Upper Extremity Assessment: Generalized weakness   Lower Extremity Assessment Lower Extremity Assessment: Generalized weakness   Cervical / Trunk Assessment Cervical / Trunk Assessment: Normal   Communication Communication Communication: No difficulties   Cognition Arousal/Alertness: Awake/alert Behavior During Therapy: WFL for tasks assessed/performed Overall Cognitive Status: Within Functional Limits for tasks assessed                     Home Living Family/patient expects to be discharged to:: Private residence Living Arrangements: Children Available Help at Discharge: Family;Available 24 hours/day Type  of Home: House Home Access: Stairs to enter [employer] of Steps: 4-5 Entrance Stairs-Rails: Can reach both Home Layout: One level     Bathroom Shower/Tub: Walk-in shower         Home  Equipment: Environmental consultant - 2 wheels;Cane - single point   Additional Comments: Pt planning on staying with his son at discharge      Prior Functioning/Environment Level of Independence: Independent        Comments: drives himself to HD normally        OT Problem List: Decreased strength;Decreased coordination;Decreased activity tolerance;Decreased safety awareness;Impaired balance (sitting and/or standing)      OT Treatment/Interventions: Self-care/ADL training;Therapeutic exercise;Therapeutic activities;Energy conservation;Patient/family education;DME and/or AE instruction;Balance training    OT Goals(Current goals can be found in the care plan section) Acute Rehab OT Goals Patient Stated Goal: to move better OT Goal Formulation: With patient Time For Goal Achievement: 06/07/19 Potential to Achieve Goals: Good  OT Frequency: Min 2X/week   Barriers to D/C: (none known at this time)             AM-PAC OT "6 Clicks" Daily Activity     Outcome Measure Help from another person eating meals?: None Help from another person taking care of personal grooming?: A Little Help from another person toileting, which includes using toliet, bedpan, or urinal?: A Little Help from another person bathing (including washing, rinsing, drying)?: A Little Help from another person to put on and taking off regular upper body clothing?: A Little Help from another person to put on and taking off regular lower body clothing?: A Little 6 Click Score: 19   End of Session Nurse Communication: Mobility status  Activity Tolerance: Patient tolerated treatment well Patient left: in chair;with call bell/phone within reach  OT Visit Diagnosis: Unsteadiness on feet (R26.81);Muscle weakness (generalized) (M62.81)                Time: 4259-5638 OT Time Calculation (min): 17 min Charges:  OT General Charges $OT Visit: 1 Visit OT Evaluation $OT Eval Low Complexity: 1 Low  Raynor Calcaterra P, MS,  OTR/L 05/24/2019, 3:20 PM

## 2019-05-24 NOTE — Plan of Care (Signed)
  Problem: Nutrition: Goal: Adequate nutrition will be maintained Outcome: Progressing   Problem: Pain Managment: Goal: General experience of comfort will improve Outcome: Progressing   Problem: Skin Integrity: Goal: Risk for impaired skin integrity will decrease Outcome: Progressing   Problem: Safety: Goal: Ability to remain free from injury will improve Outcome: Progressing   Problem: Skin Integrity: Goal: Risk for impaired skin integrity will decrease Outcome: Progressing

## 2020-08-16 ENCOUNTER — Emergency Department (HOSPITAL_COMMUNITY): Payer: No Typology Code available for payment source

## 2020-08-16 ENCOUNTER — Emergency Department (HOSPITAL_COMMUNITY)
Admission: EM | Admit: 2020-08-16 | Discharge: 2020-08-16 | Disposition: A | Discharge status: Home / Self Care | Payer: No Typology Code available for payment source | Attending: Emergency Medicine | Admitting: Emergency Medicine

## 2020-08-16 ENCOUNTER — Encounter (HOSPITAL_COMMUNITY): Payer: Self-pay

## 2020-08-16 DIAGNOSIS — H538 Other visual disturbances: Secondary | ICD-10-CM | POA: Diagnosis present

## 2020-08-16 DIAGNOSIS — I120 Hypertensive chronic kidney disease with stage 5 chronic kidney disease or end stage renal disease: Secondary | ICD-10-CM | POA: Insufficient documentation

## 2020-08-16 DIAGNOSIS — Z992 Dependence on renal dialysis: Secondary | ICD-10-CM | POA: Diagnosis not present

## 2020-08-16 DIAGNOSIS — N186 End stage renal disease: Secondary | ICD-10-CM | POA: Diagnosis not present

## 2020-08-16 DIAGNOSIS — I10 Essential (primary) hypertension: Secondary | ICD-10-CM

## 2020-08-16 DIAGNOSIS — Z7982 Long term (current) use of aspirin: Secondary | ICD-10-CM | POA: Insufficient documentation

## 2020-08-16 DIAGNOSIS — Z79899 Other long term (current) drug therapy: Secondary | ICD-10-CM | POA: Diagnosis not present

## 2020-08-16 LAB — BASIC METABOLIC PANEL
Anion gap: 13 (ref 5–15)
BUN: 24 mg/dL — ABNORMAL HIGH (ref 8–23)
CO2: 27 mmol/L (ref 22–32)
Calcium: 9.2 mg/dL (ref 8.9–10.3)
Chloride: 97 mmol/L — ABNORMAL LOW (ref 98–111)
Creatinine, Ser: 7.08 mg/dL — ABNORMAL HIGH (ref 0.61–1.24)
GFR calc Af Amer: 9 mL/min — ABNORMAL LOW (ref >60)
GFR calc non Af Amer: 8 mL/min — ABNORMAL LOW (ref >60)
Glucose, Bld: 91 mg/dL (ref 70–99)
Potassium: 4.2 mmol/L (ref 3.5–5.1)
Sodium: 137 mmol/L (ref 135–145)

## 2020-08-16 LAB — CBC
HCT: 34.3 % — ABNORMAL LOW (ref 39.0–52.0)
Hemoglobin: 10.8 g/dL — ABNORMAL LOW (ref 13.0–17.0)
MCH: 27.4 pg (ref 26.0–34.0)
MCHC: 31.5 g/dL (ref 30.0–36.0)
MCV: 87.1 fL (ref 80.0–100.0)
Platelets: 226 10*3/uL (ref 150–400)
RBC: 3.94 MIL/uL — ABNORMAL LOW (ref 4.22–5.81)
RDW: 14.4 % (ref 11.5–15.5)
WBC: 4.6 10*3/uL (ref 4.0–10.5)
nRBC: 0 % (ref 0.0–0.2)

## 2020-08-16 LAB — TROPONIN I (HIGH SENSITIVITY)
Troponin I (High Sensitivity): 14 ng/L (ref <18)
Troponin I (High Sensitivity): 15 ng/L (ref <18)

## 2020-08-16 MED ORDER — AMLODIPINE BESYLATE 5 MG PO TABS
10.0000 mg | ORAL_TABLET | Freq: Once | ORAL | Status: AC
  Administered 2020-08-16: 10 mg via ORAL
  Filled 2020-08-16: qty 2

## 2020-08-16 MED ORDER — METOPROLOL TARTRATE 5 MG/5ML IV SOLN
5.0000 mg | Freq: Once | INTRAVENOUS | Status: AC
  Administered 2020-08-16: 5 mg via INTRAVENOUS
  Filled 2020-08-16: qty 5

## 2020-08-16 NOTE — ED Provider Notes (Signed)
[facility] EMERGENCY DEPARTMENT Provider Note   CSN: [id number] Arrival date & time: 08/16/20  1624     History Chief Complaint  Patient presents with  . Blurred Vision    Allen Miller is a 64 y.o. male.  Pt presents to the ED today with an abnormal EKG from the [place].  EMS was called to activate a STEMI. [facility] not show [facility] of [facility] and repeat EKG also does not show evidence of a STEMI.  He denies cp or sob.  The pt was given asa by the [place].  Pt does have some blurry vision, but he is at his baseline.  He said it was worse this am.  Pt saw his ophthalmologist this week and glaucoma is at its baseline.  Pt is hypertensive.  He took his meds this am and is due for an evening dose.  He is a dialysis patient and had dialysis last yesterday.        Past Medical History:  Diagnosis Date  . Cancer [facility])    kidney cancer  . Coffee ground emesis 11/2017  . ESRD (end stage renal disease) ([place])    MWF  . Hemodialysis patient ([place])   . Hypertension   . Low iron   . Shortness of breath dyspnea    when lying flat  . Stroke [facility])    effective vision- Left eye  . Viral gastroenteritis 05/2019    Patient Active Problem List   Diagnosis Date Noted  . Peripheral vertigo 05/24/2019  . Viral gastroenteritis 05/22/2019  . Hypertensive urgency 05/22/2019  . Encounter for screening for HIV 05/22/2019  . Essential hypertension   . Clotted dialysis access ([place]) 07/25/2018  . GERD with esophagitis 12/26/2017  . Dysphagia   . Non-intractable vomiting with nausea   . Coffee ground emesis 12/21/2017  . Chest pain 12/21/2017  . Hyperkalemia 08/25/2016  . Acute CVA (cerebrovascular accident) ([place]) 01/24/2016  . Hypertensive emergency 01/21/2016  . History of CVA (cerebrovascular accident) 01/21/2016  . Visual disturbance 01/21/2016  . Microcytic anemia 01/21/2016  . ESRD on dialysis ([place]) 01/21/2016  . Blurred vision, left eye     Past Surgical History:   Procedure Laterality Date  . A/V FISTULAGRAM Right 06/25/2017   Procedure: A/V Fistulagram;  Surgeon: Elam Dutch, MD;  Location: [facility] CV LAB;  Service: Cardiovascular;  Laterality: Right;  . AV FISTULA PLACEMENT Left 10/27/2016   Procedure: INSERTION LEFT UPPER ARM  OF ARTERIOVENOUS (AV) GORE-TEX GRAFT ARM;  Surgeon: Conrad Union, MD;  Location: [facility];  Service: Vascular;  Laterality: Left;  . AV FISTULA PLACEMENT Right 03/23/2017   Procedure: ARTERIOVENOUS (AV) FISTULA CREATION;  Surgeon: Elam Dutch, MD;  Location: [place];  Service: Vascular;  Laterality: Right;  . BASCILIC VEIN TRANSPOSITION Left 02/24/2016   Procedure: BASILIC VEIN TRANSPOSITION VERSUS ARTERIOVENOUS GRAFT INSERTION;  Surgeon: [name], MD;  Location: [place];  Service: Vascular;  Laterality: Left;  . BASCILIC VEIN TRANSPOSITION Left 07/14/2016   Procedure: LEFT BASILIC VEIN TRANSPOSITION;  Surgeon: Conrad Lafferty, MD;  Location: [place];  Service: Vascular;  Laterality: Left;  . BASCILIC VEIN TRANSPOSITION Left 09/08/2016   Procedure: SECOND STAGE BASILIC VEIN TRANSPOSITION LEFT UPPER ARM;  Surgeon: Conrad Newtown, MD;  Location: [facility];  Service: Vascular;  Laterality: Left;  . ESOPHAGOGASTRODUODENOSCOPY N/A 12/26/2017   Procedure: ESOPHAGOGASTRODUODENOSCOPY (EGD);  Surgeon: Ladene Artist, MD;  Location: [facility] ENDOSCOPY;  Service: Endoscopy;  Laterality: N/A;  .  INSERTION OF DIALYSIS CATHETER N/A 02/24/2016   Procedure: INSERTION OF DIALYSIS CATHETER;  Surgeon: [name], MD;  Location: [place];  Service: Vascular;  Laterality: N/A;  . INSERTION OF DIALYSIS CATHETER Right 10/27/2016   Procedure: EXCHANGE OF RIGHT INTERNAL JUGULAR DIALYSIS CATHETER;  Surgeon: Conrad Nowata, MD;  Location: [place];  Service: Vascular;  Laterality: Right;  . IR FLUORO GUIDE CV LINE RIGHT  07/26/2018  . IR US GUIDE VASC ACCESS RIGHT  07/26/2018  . PERIPHERAL VASCULAR BALLOON ANGIOPLASTY Right 06/25/2017   Procedure: Peripheral Vascular  Balloon Angioplasty;  Surgeon: Elam Dutch, MD;  Location: [place] CV LAB;  Service: Cardiovascular;  Laterality: Right;  . THROMBECTOMY AND REVISION OF ARTERIOVENTOUS (AV) GORETEX  GRAFT Left 03/02/2017   Procedure: THROMBECTOMY AND REVISION OF ARTERIOVENous left arm GORETEX  GRAFT;  Surgeon: Elam Dutch, MD;  Location: [place];  Service: Vascular;  Laterality: Left;  . THROMBECTOMY W/ EMBOLECTOMY Left 12/14/2016   Procedure: THROMBECTOMY OF LEFT  ARTERIO-VENOUS GORTEX GRAFT; REVISION OF LEFT  ARTERIO-VENOUS GORE-TEX GRAFT;  Surgeon: [name], MD;  Location: [place];  Service: Vascular;  Laterality: Left;       History reviewed. No pertinent family history.  Social History   Tobacco Use  . Smoking status: Never Smoker  . Smokeless tobacco: Never Used  Vaping Use  . Vaping Use: Never used  Substance Use Topics  . Alcohol use: No  . Drug use: No    Home Medications Prior to Admission medications   Medication Sig Start Date End Date Taking? Authorizing Provider  acetaminophen (TYLENOL) 325 MG tablet Take 2 tablets (650 mg total) by mouth every 6 (six) hours as needed for mild pain, fever or headache. 12/24/17   Mariel Aloe, MD  amLODipine (NORVASC) 5 MG tablet Take 2 tablets (10 mg total) by mouth daily. 07/26/18   Rehman, Areeg N, DO  aspirin 81 MG chewable tablet Chew 81 mg by mouth daily.     [provider]  brimonidine (ALPHAGAN) 0.2 % ophthalmic solution Place 1 drop into the left eye 3 (three) times daily.    [provider]  calcitRIOL (ROCALTROL) 0.5 MCG capsule Take 1 capsule (0.5 mcg total) by mouth every Monday, Wednesday, and Friday with hemodialysis. Patient taking differently: Take 0.5 mcg by mouth Every Tuesday,Thursday,and Saturday with dialysis.  12/27/17   Mariel Aloe, MD  calcium carbonate (TUMS EX) 750 MG chewable tablet Chew 2 tablets by mouth as needed for heartburn.    [provider]  lanthanum (FOSRENOL) 500 MG  chewable tablet Chew 500 mg by mouth 3 (three) times daily with meals.    [provider]  meclizine (ANTIVERT) 25 MG tablet Take 1 tablet (25 mg total) by mouth 2 (two) times daily as needed for dizziness. 05/24/19   Dhungel, Flonnie Overman, MD  ondansetron (ZOFRAN ODT) 4 MG disintegrating tablet Take 1 tablet (4 mg total) by mouth every 8 (eight) hours as needed for nausea or vomiting. 12/26/17   Mariel Aloe, MD    Allergies    Pork-derived products and Ppd [tuberculin purified protein derivative]  Review of Systems   Review of Systems  All other systems reviewed and are negative.   Physical Exam Updated Vital Signs BP (!) 177/105   Pulse 83   Temp 98.6 F (37 C) (Oral)   Resp 13   Ht 5\' 5"  (1.651 m)   Wt 81.2 kg   SpO2 98%   BMI  29.79 kg/m   Physical Exam Vitals and nursing note reviewed.  Constitutional:      Appearance: Normal appearance.  HENT:     Head: Normocephalic and atraumatic.     Right Ear: External ear normal.     Left Ear: External ear normal.     Nose: Nose normal.     Mouth/Throat:     Mouth: Mucous membranes are moist.     Pharynx: Oropharynx is clear.  Eyes:     Extraocular Movements: Extraocular movements intact.     Conjunctiva/sclera: Conjunctivae normal.     Pupils: Pupils are equal, round, and reactive to light.  Cardiovascular:     Rate and Rhythm: Normal rate and regular rhythm.     Pulses: Normal pulses.     Heart sounds: Normal heart sounds.  Pulmonary:     Effort: Pulmonary effort is normal.  Abdominal:     General: Abdomen is flat. Bowel sounds are normal.     Palpations: Abdomen is soft.  Musculoskeletal:     Cervical back: Normal range of motion and neck supple.  Skin:    General: Skin is warm.     Capillary Refill: Capillary refill takes less than 2 seconds.  Neurological:     General: No focal deficit present.     Mental Status: He is alert and oriented to person, place, and time.  Psychiatric:        Mood and Affect:  Mood normal.        Behavior: Behavior normal.        Thought Content: Thought content normal.        Judgment: Judgment normal.     ED Results / Procedures / Treatments   Labs (all labs ordered are listed, but only abnormal results are displayed) Labs Reviewed  BASIC METABOLIC PANEL - Abnormal; Notable for the following components:      Result Value   Chloride 97 (*)    BUN 24 (*)    Creatinine, Ser 7.08 (*)    GFR calc non Af Amer 8 (*)    GFR calc Af Amer 9 (*)    All other components within normal limits  CBC - Abnormal; Notable for the following components:   RBC 3.94 (*)    Hemoglobin 10.8 (*)    HCT 34.3 (*)    All other components within normal limits  TROPONIN I (HIGH SENSITIVITY)  TROPONIN I (HIGH SENSITIVITY)    EKG EKG Interpretation  Date/Time:  Friday August 16 2020 16:25:11 EDT Ventricular Rate:  70 PR Interval:    QRS Duration: 96 QT Interval:  420 QTC Calculation: 454 R Axis:   21 Text Interpretation: Sinus rhythm Atrial premature complexes Borderline T wave abnormalities No significant change since last tracing Confirmed by Isla Pence [phone]) on 08/16/2020 4:28:07 PM   Radiology DG Chest 2 View  Result Date: 08/16/2020 CLINICAL DATA:  Hypertension, on dialysis EXAM: CHEST - 2 VIEW COMPARISON:  Chest x-ray 12/23/2017 FINDINGS: Interval removal of a right chest wall dialysis catheter. Interval placement of a left chest wall central venous catheter with tip terminating over the right atrium. Redemonstration of a left axillary/brachial vascular stent. Interval placement of a right axillary/brachial vascular stent. The heart size and mediastinal contours are unchanged. Aortic arch calcifications. No focal consolidation. No pulmonary edema. Persistent nonspecific blunting of the right costophrenic angle with no definite pleural effusion. No pneumothorax. No acute osseous abnormality. Old healed left rib fracture. Degenerative changes of the left  shoulder.  IMPRESSION: No active cardiopulmonary disease. Electronically Signed   By: Iven Finn M.D.   On: 08/16/2020 18:05    Procedures Procedures (including critical care time)  Medications Ordered in ED Medications  metoprolol tartrate (LOPRESSOR) injection 5 mg (5 mg Intravenous Given 08/16/20 1720)  amLODipine (NORVASC) tablet 10 mg (10 mg Oral Provided for home use 08/16/20 1917)    ED Course  I have reviewed the triage vital signs and the nursing notes.  Pertinent labs & imaging results that were available during my care of the patient were reviewed by me and considered in my medical decision making (see chart for details).    MDM Rules/Calculators/A&P                          Pt said he feels fine.  He never had cp.  Pt's bp is still elevated after lopressor, but improved.  He said he takes lopressor at night and norvasc in the morning.  He was at the [place] today to get a refill of his norvasc and does not have any more of it.  It is at the [facility].  Pt has the lopressor at home and is told to take that when he gets home.  He is given a dose of the norvasc to take in the morning.  He is going to the [place] tomorrow to get the norvasc.  He knows to return if worse.  F/u with pcp. Final Clinical Impression(s) / ED Diagnoses Final diagnoses:  Essential hypertension  ESRD on hemodialysis [facility])    Rx / DC Orders ED Discharge Orders    None       Isla Pence, MD 08/16/20 [phone]

## 2020-08-16 NOTE — ED Triage Notes (Signed)
Pt arrived to ED via [facility] from [facility] hospital d/t c/o blurred vision that has gotten worse than what pt considers his baseline vision. [place] [facility] called EMS with reports that pt's 12 lead showed signs of a stemi. EMS transmitted 12 lead to this facility & was told "no stemi" (per EMS). EMS reported [facility] did give him 324 mg ASA and that pt denies CP, SOB, weakness &/or dizziness. Upon arrival to ED pt is A/Ox4, verbal- able to make needs known.

## 2020-12-29 ENCOUNTER — Emergency Department (HOSPITAL_COMMUNITY)
Admission: EM | Admit: 2020-12-29 | Discharge: 2020-12-30 | Disposition: A | Discharge status: Home / Self Care | Payer: No Typology Code available for payment source | Attending: Emergency Medicine | Admitting: Emergency Medicine

## 2020-12-29 ENCOUNTER — Other Ambulatory Visit: Payer: Self-pay

## 2020-12-29 ENCOUNTER — Emergency Department (HOSPITAL_COMMUNITY): Payer: No Typology Code available for payment source

## 2020-12-29 ENCOUNTER — Encounter (HOSPITAL_COMMUNITY): Payer: Self-pay | Admitting: Emergency Medicine

## 2020-12-29 DIAGNOSIS — Z79899 Other long term (current) drug therapy: Secondary | ICD-10-CM | POA: Insufficient documentation

## 2020-12-29 DIAGNOSIS — I10 Essential (primary) hypertension: Secondary | ICD-10-CM

## 2020-12-29 DIAGNOSIS — N186 End stage renal disease: Secondary | ICD-10-CM | POA: Diagnosis not present

## 2020-12-29 DIAGNOSIS — Z85528 Personal history of other malignant neoplasm of kidney: Secondary | ICD-10-CM | POA: Insufficient documentation

## 2020-12-29 DIAGNOSIS — I120 Hypertensive chronic kidney disease with stage 5 chronic kidney disease or end stage renal disease: Secondary | ICD-10-CM | POA: Insufficient documentation

## 2020-12-29 DIAGNOSIS — Z20822 Contact with and (suspected) exposure to covid-19: Secondary | ICD-10-CM | POA: Diagnosis not present

## 2020-12-29 DIAGNOSIS — Z7982 Long term (current) use of aspirin: Secondary | ICD-10-CM | POA: Insufficient documentation

## 2020-12-29 DIAGNOSIS — R030 Elevated blood-pressure reading, without diagnosis of hypertension: Secondary | ICD-10-CM | POA: Diagnosis present

## 2020-12-29 DIAGNOSIS — Z992 Dependence on renal dialysis: Secondary | ICD-10-CM | POA: Diagnosis not present

## 2020-12-29 LAB — BASIC METABOLIC PANEL
Anion gap: 18 — ABNORMAL HIGH (ref 5–15)
BUN: 50 mg/dL — ABNORMAL HIGH (ref 8–23)
CO2: 24 mmol/L (ref 22–32)
Calcium: 8.8 mg/dL — ABNORMAL LOW (ref 8.9–10.3)
Chloride: 97 mmol/L — ABNORMAL LOW (ref 98–111)
Creatinine, Ser: 9.75 mg/dL — ABNORMAL HIGH (ref 0.61–1.24)
GFR, Estimated: 6 mL/min — ABNORMAL LOW (ref >60)
Glucose, Bld: 84 mg/dL (ref 70–99)
Potassium: 4.2 mmol/L (ref 3.5–5.1)
Sodium: 139 mmol/L (ref 135–145)

## 2020-12-29 LAB — CBC
HCT: 40.7 % (ref 39.0–52.0)
Hemoglobin: 12.1 g/dL — ABNORMAL LOW (ref 13.0–17.0)
MCH: 25.7 pg — ABNORMAL LOW (ref 26.0–34.0)
MCHC: 29.7 g/dL — ABNORMAL LOW (ref 30.0–36.0)
MCV: 86.4 fL (ref 80.0–100.0)
Platelets: 296 10*3/uL (ref 150–400)
RBC: 4.71 MIL/uL (ref 4.22–5.81)
RDW: 16.2 % — ABNORMAL HIGH (ref 11.5–15.5)
WBC: 5.5 10*3/uL (ref 4.0–10.5)
nRBC: 0 % (ref 0.0–0.2)

## 2020-12-29 MED ORDER — AMLODIPINE BESYLATE 5 MG PO TABS: 5.0000 mg | ORAL_TABLET | Freq: Once | ORAL | Status: DC

## 2020-12-29 MED ORDER — AMLODIPINE BESYLATE 5 MG PO TABS
10.0000 mg | ORAL_TABLET | Freq: Once | ORAL | Status: AC
  Administered 2020-12-30: 10 mg via ORAL
  Filled 2020-12-29: qty 2

## 2020-12-29 MED ORDER — SODIUM CHLORIDE 0.9% FLUSH: 3.0000 mL | Freq: Once | INTRAVENOUS | Status: DC

## 2020-12-29 NOTE — ED Provider Notes (Signed)
[facility] EMERGENCY DEPARTMENT Provider Note   CSN: [id number] Arrival date & time: 12/29/20  1736     History Chief Complaint  Patient presents with   Hypertension   Dizziness   Nausea    Allen Miller is a 65 y.o. male.  The history is provided by the patient and medical records.    65 year old male with history of renal cancer, end-stage renal disease on hemodialysis, hypertension, prior stroke with affected left eye, presenting to the ED with complaint of feeling unwell.  States yesterday evening he started feeling poorly all of a sudden.  He reports headache, fatigue, and "not feeling right".  He noticed that his blood pressure was elevated.  He has been out of his amlodipine since Thursday (3 days now).  States he is waiting for the [facility] to mail his prescription to him.  He denies any chest pain or shortness of breath.  Has had some nausea but denies vomiting.  Has been eating and drinking well.  His usual dialysis schedule is Tuesday, Thursday, Saturday, but he was supposed to travel to [place] so he altered his schedule last week and his last treatment was Friday.  He is due to resume his normal schedule on Tuesday this week. He denies any sick contacts or covid exposures.  Past Medical History:  Diagnosis Date   Cancer ([place])    kidney cancer   Coffee ground emesis 11/2017   ESRD (end stage renal disease) ([place])    MWF   Hemodialysis patient ([place])    Hypertension    Low iron    Shortness of breath dyspnea    when lying flat   Stroke [facility])    effective vision- Left eye   Viral gastroenteritis 05/2019    Patient Active Problem List   Diagnosis Date Noted   Peripheral vertigo 05/24/2019   Viral gastroenteritis 05/22/2019   Hypertensive urgency 05/22/2019   Encounter for screening for HIV 05/22/2019   Essential hypertension    Clotted dialysis access ([place]) 07/25/2018   GERD with esophagitis 12/26/2017   Dysphagia     Non-intractable vomiting with nausea    Coffee ground emesis 12/21/2017   Chest pain 12/21/2017   Hyperkalemia 08/25/2016   Acute CVA (cerebrovascular accident) ([place]) 01/24/2016   Hypertensive emergency 01/21/2016   History of CVA (cerebrovascular accident) 01/21/2016   Visual disturbance 01/21/2016   Microcytic anemia 01/21/2016   ESRD on dialysis [facility]) 01/21/2016   Blurred vision, left eye     Past Surgical History:  Procedure Laterality Date   A/V FISTULAGRAM Right 06/25/2017   Procedure: A/V Fistulagram;  Surgeon: Elam Dutch, MD;  Location: [place] CV LAB;  Service: Cardiovascular;  Laterality: Right;   AV FISTULA PLACEMENT Left 10/27/2016   Procedure: INSERTION LEFT UPPER ARM  OF ARTERIOVENOUS (AV) GORE-TEX GRAFT ARM;  Surgeon: Conrad Hoboken, MD;  Location: [place];  Service: Vascular;  Laterality: Left;   AV FISTULA PLACEMENT Right 03/23/2017   Procedure: ARTERIOVENOUS (AV) FISTULA CREATION;  Surgeon: Elam Dutch, MD;  Location: [place];  Service: Vascular;  Laterality: Right;   [place] Left 02/24/2016   Procedure: BASILIC VEIN TRANSPOSITION VERSUS ARTERIOVENOUS GRAFT INSERTION;  Surgeon: [name], MD;  Location: [place];  Service: Vascular;  Laterality: Left;   [place] Left 07/14/2016   Procedure: LEFT BASILIC VEIN TRANSPOSITION;  Surgeon: Conrad Leland, MD;  Location: [place];  Service: Vascular;  Laterality: Left;   [place]  Left 09/08/2016   Procedure: SECOND STAGE BASILIC VEIN TRANSPOSITION LEFT UPPER ARM;  Surgeon: Conrad Hanalei, MD;  Location: [place];  Service: Vascular;  Laterality: Left;   ESOPHAGOGASTRODUODENOSCOPY N/A 12/26/2017   Procedure: ESOPHAGOGASTRODUODENOSCOPY (EGD);  Surgeon: Ladene Artist, MD;  Location: [facility] ENDOSCOPY;  Service: Endoscopy;  Laterality: N/A;   INSERTION OF DIALYSIS CATHETER N/A 02/24/2016   Procedure: INSERTION OF DIALYSIS CATHETER;  Surgeon: [name], MD;   Location: [place];  Service: Vascular;  Laterality: N/A;   INSERTION OF DIALYSIS CATHETER Right 10/27/2016   Procedure: EXCHANGE OF RIGHT INTERNAL JUGULAR DIALYSIS CATHETER;  Surgeon: Conrad Weatherford, MD;  Location: [place];  Service: Vascular;  Laterality: Right;   IR FLUORO GUIDE CV LINE RIGHT  07/26/2018   IR US GUIDE VASC ACCESS RIGHT  07/26/2018   PERIPHERAL VASCULAR BALLOON ANGIOPLASTY Right 06/25/2017   Procedure: Peripheral Vascular Balloon Angioplasty;  Surgeon: Elam Dutch, MD;  Location: [place] CV LAB;  Service: Cardiovascular;  Laterality: Right;   THROMBECTOMY AND REVISION OF ARTERIOVENTOUS (AV) GORETEX  GRAFT Left 03/02/2017   Procedure: THROMBECTOMY AND REVISION OF ARTERIOVENous left arm GORETEX  GRAFT;  Surgeon: Elam Dutch, MD;  Location: [facility] OR;  Service: Vascular;  Laterality: Left;   THROMBECTOMY W/ EMBOLECTOMY Left 12/14/2016   Procedure: THROMBECTOMY OF LEFT  ARTERIO-VENOUS GORTEX GRAFT; REVISION OF LEFT  ARTERIO-VENOUS GORE-TEX GRAFT;  Surgeon: [name], MD;  Location: [place];  Service: Vascular;  Laterality: Left;       No family history on file.  Social History   Tobacco Use   Smoking status: Never Smoker   Smokeless tobacco: Never Used  Vaping Use   Vaping Use: Never used  Substance Use Topics   Alcohol use: No   Drug use: No    Home Medications Prior to Admission medications   Medication Sig Start Date End Date Taking? Authorizing Provider  acetaminophen (TYLENOL) 325 MG tablet Take 2 tablets (650 mg total) by mouth every 6 (six) hours as needed for mild pain, fever or headache. 12/24/17   Mariel Aloe, MD  amLODipine (NORVASC) 5 MG tablet Take 2 tablets (10 mg total) by mouth daily. 07/26/18   Rehman, Areeg N, DO  aspirin 81 MG chewable tablet Chew 81 mg by mouth daily.     [provider]  brimonidine (ALPHAGAN) 0.2 % ophthalmic solution Place 1 drop into the left eye 3 (three) times daily.    [provider]   calcitRIOL (ROCALTROL) 0.5 MCG capsule Take 1 capsule (0.5 mcg total) by mouth every Monday, Wednesday, and Friday with hemodialysis. Patient taking differently: Take 0.5 mcg by mouth Every Tuesday,Thursday,and Saturday with dialysis.  12/27/17   Mariel Aloe, MD  calcium carbonate (TUMS EX) 750 MG chewable tablet Chew 2 tablets by mouth as needed for heartburn.    [provider]  lanthanum (FOSRENOL) 500 MG chewable tablet Chew 500 mg by mouth 3 (three) times daily with meals.    [provider]  meclizine (ANTIVERT) 25 MG tablet Take 1 tablet (25 mg total) by mouth 2 (two) times daily as needed for dizziness. 05/24/19   Dhungel, Flonnie Overman, MD  ondansetron (ZOFRAN ODT) 4 MG disintegrating tablet Take 1 tablet (4 mg total) by mouth every 8 (eight) hours as needed for nausea or vomiting. 12/26/17   Mariel Aloe, MD    Allergies    Pork-derived products and Ppd [tuberculin purified protein derivative]  Review of Systems  Review of Systems  Constitutional: Positive for fatigue.  Neurological: Positive for headaches.  All other systems reviewed and are negative.   Physical Exam Updated Vital Signs BP (!) 240/104 (BP Location: Right Arm)    Pulse 71    Temp 98.6 F (37 C) (Oral)    Resp 20    Ht 5\' 5"  (1.651 m)    Wt 81.6 kg    SpO2 99%    BMI 29.95 kg/m   Physical Exam Vitals and nursing note reviewed.  Constitutional:      Appearance: He is well-developed and well-nourished.  HENT:     Head: Normocephalic and atraumatic.     Mouth/Throat:     Mouth: Oropharynx is clear and moist.  Eyes:     Extraocular Movements: EOM normal.     Conjunctiva/sclera: Conjunctivae normal.     Pupils: Pupils are equal, round, and reactive to light.  Cardiovascular:     Rate and Rhythm: Normal rate and regular rhythm.     Heart sounds: Normal heart sounds.  Pulmonary:     Effort: Pulmonary effort is normal. No respiratory distress.     Breath sounds: Normal breath sounds. No  rhonchi.  Abdominal:     General: Bowel sounds are normal.     Palpations: Abdomen is soft.     Tenderness: There is no abdominal tenderness. There is no rebound.  Musculoskeletal:        General: Normal range of motion.     Cervical back: Normal range of motion.  Skin:    General: Skin is warm and dry.  Neurological:     Mental Status: He is alert and oriented to person, place, and time.  Psychiatric:        Mood and Affect: Mood and affect normal.     ED Results / Procedures / Treatments   Labs (all labs ordered are listed, but only abnormal results are displayed) Labs Reviewed  BASIC METABOLIC PANEL - Abnormal; Notable for the following components:      Result Value   Chloride 97 (*)    BUN 50 (*)    Creatinine, Ser 9.75 (*)    Calcium 8.8 (*)    GFR, Estimated 6 (*)    Anion gap 18 (*)    All other components within normal limits  CBC - Abnormal; Notable for the following components:   Hemoglobin 12.1 (*)    MCH 25.7 (*)    MCHC 29.7 (*)    RDW 16.2 (*)    All other components within normal limits  SARS CORONAVIRUS 2 BY RT PCR [facility] ORDER, [place] [facility])    EKG EKG Interpretation  Date/Time:  Sunday December 29 2020 18:22:18 EST Ventricular Rate:  79 PR Interval:  152 QRS Duration: 88 QT Interval:  398 QTC Calculation: 456 R Axis:   61 Text Interpretation: Sinus rhythm with marked sinus arrhythmia REPOLARIZATION ABNORMALITY Abnormal ECG Confirmed by Ripley Fraise [phone]) on 12/29/2020 11:07:15 PM   Radiology DG Chest Port 1 View  Result Date: 12/29/2020 CLINICAL DATA:  Generalized weakness EXAM: PORTABLE CHEST 1 VIEW COMPARISON:  08/16/2020 FINDINGS: Cardiac shadow is mildly enlarged but stable. Aortic calcifications are noted. Hero graft is again seen and stable. Fluid is noted within the minor fissure on the right creating a pseudo tumor appearance. Vascular stenting in the upper extremities is noted. No focal infiltrate is  seen. IMPRESSION: Small pleural fluid trapped within the minor fissure on the right. Remainder of  the exam is stable from the prior study. Electronically Signed   By: Inez Catalina M.D.   On: 12/29/2020 23:24    Procedures Procedures   Medications Ordered in ED Medications  sodium chloride flush (NS) 0.9 % injection 3 mL (has no administration in time range)  amLODipine (NORVASC) tablet 10 mg (10 mg Oral Given 12/30/20 0013)    ED Course  I have reviewed the triage vital signs and the nursing notes.  Pertinent labs & imaging results that were available during my care of the patient were reviewed by me and considered in my medical decision making (see chart for details).    MDM Rules/Calculators/A&P  65 year old male presenting to the ED with headache and lightheadedness since yesterday.  Has been out of his amlodipine for 3 days now, states that should be in the mail tomorrow.  He is not have any focal numbness or weakness.  Also reports fatigue.  He is afebrile and nontoxic in appearance.  He is awake, alert, appropriately oriented.  No focal deficits.  He has hypertensive here 604'V/40'J systolic which I suspect is from not having his home amlodipine.  His labs are reassuring given his dialysis status.  Will obtain chest x-ray, Covid screen.  Given dose of his amlodipine.  covid screen pending.  CXR with small pleural fluid but no overt edema or infiltrate noted.  BP has improved to 173/65 with home amlodipine.  States he feels better.  He has been able to ambulate with steady gait here in the department.  Feel he is stable for discharge home.  States his amlodipine should be delivered latera today.  Will resume as directed, resume dialysis Tuesday as scheduled.  Follow-up with PCP.  Return here for new concerns.  Final Clinical Impression(s) / ED Diagnoses Final diagnoses:  Hypertension, unspecified type    Rx / DC Orders ED Discharge Orders    None       Larene Pickett,  PA-C 12/30/20 0416    Ripley Fraise, MD 12/30/20 [phone]

## 2020-12-29 NOTE — ED Provider Notes (Signed)
Patient seen/examined in the Emergency Department in conjunction with Advanced Practice Provider [place] Patient reports elevated blood pressure, dizziness and generalized weakness for past 2 days.  He is on dialysis Exam : awake/alert, no arm/leg drift, patient is resting comfortably.  No focal neuro deficits noted Plan: will treat blood pressure and reassess.  Patient will need to ambulate prior to discharge    Ripley Fraise, MD 12/29/20 2335

## 2020-12-29 NOTE — ED Triage Notes (Addendum)
Pt reports hypertension, dizziness, generalized weakness, and nausea since Friday.  Denies pain.  No neuro deficits noted.  Dialysis on Friday.

## 2020-12-30 LAB — SARS CORONAVIRUS 2 BY RT PCR (HOSPITAL ORDER, PERFORMED IN ~~LOC~~ HOSPITAL LAB): SARS Coronavirus 2: NEGATIVE

## 2020-12-30 NOTE — ED Notes (Signed)
Pt voided with bm

## 2020-12-30 NOTE — ED Notes (Signed)
High bp out of bp meds since thursday

## 2020-12-30 NOTE — ED Notes (Signed)
Pt ambulated in hall with a steady gait. PA [facility] made aware.

## 2020-12-30 NOTE — ED Notes (Signed)
Pt voided with  A bm

## 2020-12-30 NOTE — Discharge Instructions (Signed)
Your blood pressure improved here and labs looked good. Your covid test is pending, you will be notified if abnormal. Resume dialysis Tuesday as scheduled. Resume your amlodipine at home. Follow-up with your primary care doctor. Return here for new concerns.

## 2021-05-02 IMAGING — DX DG CHEST 1V PORT
1 series · 1 of 1 positions shown · non-contrast
Comparison: 08/16/2020

CLINICAL DATA: Generalized weakness

EXAM:
PORTABLE CHEST 1 VIEW

[chest ap]
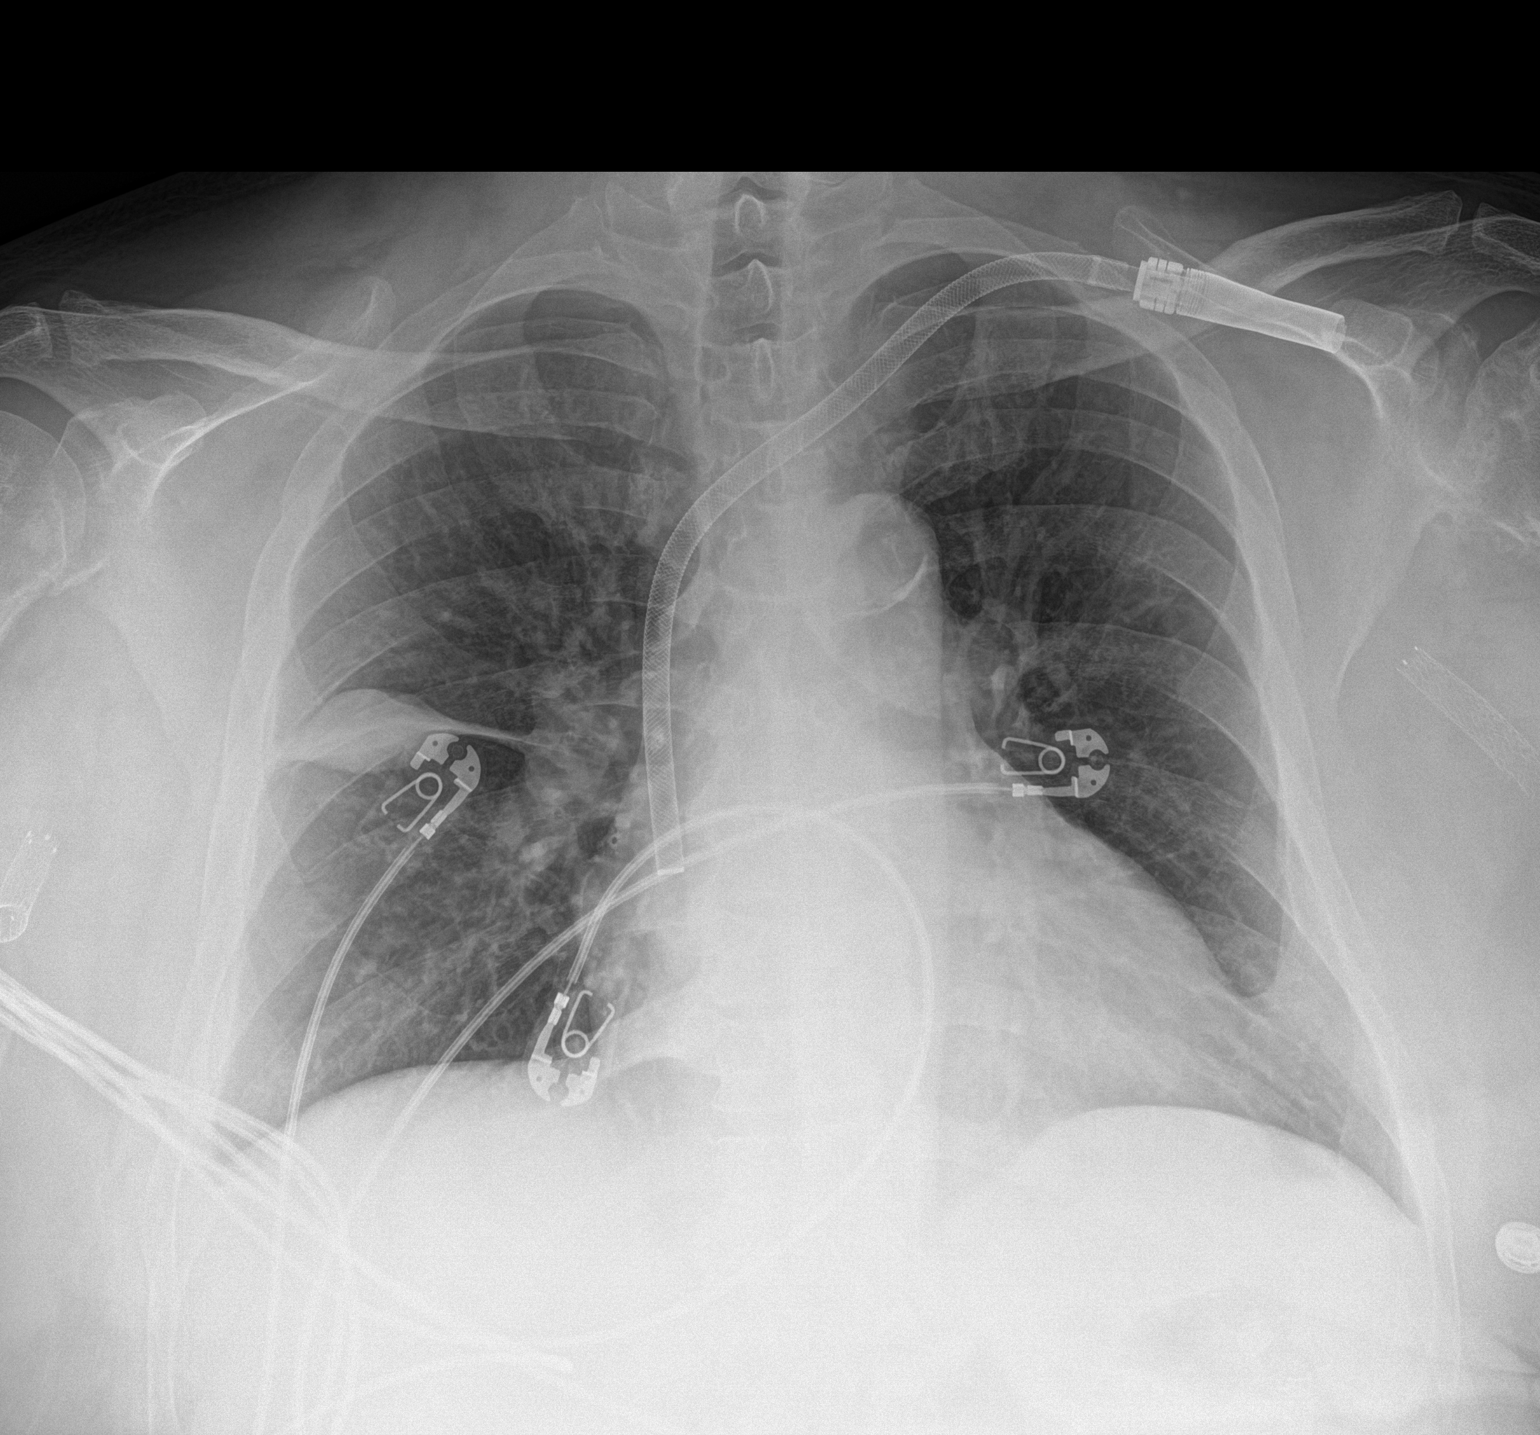

[1 of 1 positions shown; findings below may reference images not displayed]

FINDINGS: Cardiac shadow is mildly enlarged but stable. Aortic calcifications
are noted. Hero graft is again seen and stable. Fluid is noted
within the minor fissure on the right creating a pseudo tumor
appearance. Vascular stenting in the upper extremities is noted. No
focal infiltrate is seen.
IMPRESSION: Small pleural fluid trapped within the minor fissure on the right.

Remainder of the exam is stable from the prior study.

## 2021-05-27 ENCOUNTER — Other Ambulatory Visit: Payer: Self-pay

## 2021-05-27 ENCOUNTER — Emergency Department (HOSPITAL_COMMUNITY): Payer: No Typology Code available for payment source

## 2021-05-27 ENCOUNTER — Encounter (HOSPITAL_COMMUNITY): Payer: Self-pay | Admitting: Emergency Medicine

## 2021-05-27 ENCOUNTER — Emergency Department (HOSPITAL_COMMUNITY)
Admission: EM | Admit: 2021-05-27 | Discharge: 2021-05-27 | Disposition: A | Discharge status: Home / Self Care | Payer: No Typology Code available for payment source | Attending: Emergency Medicine | Admitting: Emergency Medicine

## 2021-05-27 DIAGNOSIS — Z992 Dependence on renal dialysis: Secondary | ICD-10-CM | POA: Diagnosis not present

## 2021-05-27 DIAGNOSIS — Z85528 Personal history of other malignant neoplasm of kidney: Secondary | ICD-10-CM | POA: Insufficient documentation

## 2021-05-27 DIAGNOSIS — Z20822 Contact with and (suspected) exposure to covid-19: Secondary | ICD-10-CM | POA: Insufficient documentation

## 2021-05-27 DIAGNOSIS — Z79899 Other long term (current) drug therapy: Secondary | ICD-10-CM | POA: Diagnosis not present

## 2021-05-27 DIAGNOSIS — I120 Hypertensive chronic kidney disease with stage 5 chronic kidney disease or end stage renal disease: Secondary | ICD-10-CM | POA: Diagnosis not present

## 2021-05-27 DIAGNOSIS — Z7982 Long term (current) use of aspirin: Secondary | ICD-10-CM | POA: Insufficient documentation

## 2021-05-27 DIAGNOSIS — N186 End stage renal disease: Secondary | ICD-10-CM | POA: Diagnosis not present

## 2021-05-27 DIAGNOSIS — R0602 Shortness of breath: Secondary | ICD-10-CM | POA: Diagnosis present

## 2021-05-27 DIAGNOSIS — I10 Essential (primary) hypertension: Secondary | ICD-10-CM

## 2021-05-27 DIAGNOSIS — R0600 Dyspnea, unspecified: Secondary | ICD-10-CM

## 2021-05-27 LAB — BASIC METABOLIC PANEL
Anion gap: 13 (ref 5–15)
BUN: 36 mg/dL — ABNORMAL HIGH (ref 8–23)
CO2: 28 mmol/L (ref 22–32)
Calcium: 9 mg/dL (ref 8.9–10.3)
Chloride: 97 mmol/L — ABNORMAL LOW (ref 98–111)
Creatinine, Ser: 7.69 mg/dL — ABNORMAL HIGH (ref 0.61–1.24)
GFR, Estimated: 7 mL/min — ABNORMAL LOW (ref >60)
Glucose, Bld: 70 mg/dL (ref 70–99)
Potassium: 3.9 mmol/L (ref 3.5–5.1)
Sodium: 138 mmol/L (ref 135–145)

## 2021-05-27 LAB — TROPONIN I (HIGH SENSITIVITY)
Troponin I (High Sensitivity): 24 ng/L — ABNORMAL HIGH (ref <18)
Troponin I (High Sensitivity): 27 ng/L — ABNORMAL HIGH (ref <18)

## 2021-05-27 LAB — CBC
HCT: 35.2 % — ABNORMAL LOW (ref 39.0–52.0)
Hemoglobin: 11 g/dL — ABNORMAL LOW (ref 13.0–17.0)
MCH: 26.6 pg (ref 26.0–34.0)
MCHC: 31.3 g/dL (ref 30.0–36.0)
MCV: 85 fL (ref 80.0–100.0)
Platelets: 303 10*3/uL (ref 150–400)
RBC: 4.14 MIL/uL — ABNORMAL LOW (ref 4.22–5.81)
RDW: 16.6 % — ABNORMAL HIGH (ref 11.5–15.5)
WBC: 4.1 10*3/uL (ref 4.0–10.5)
nRBC: 0 % (ref 0.0–0.2)

## 2021-05-27 LAB — RESP PANEL BY RT-PCR (FLU A&B, COVID) ARPGX2
Influenza A by PCR: NEGATIVE
Influenza B by PCR: NEGATIVE
SARS Coronavirus 2 by RT PCR: NEGATIVE

## 2021-05-27 NOTE — ED Provider Notes (Signed)
[facility] EMERGENCY DEPARTMENT Provider Note   CSN: [id number] Arrival date & time: 05/27/21  1608     History No chief complaint on file.   Allen Miller is a 65 y.o. male.  HPI Patient presents with shortness of breath.  Has had for around the last week.  States began after his nephrologist switched from Coreg to atenolol.  States he feels his blood pressures been running high.  Has done dialysis since then and states that he would feel good immediately after but before too long was feeling bad again.  Worse with laying flat.  No chest pain.  No fevers.  Occasional cough without any production.  Patient states that they have been able to get him down to his dry weight.    Past Medical History:  Diagnosis Date   Cancer ([place])    kidney cancer   Coffee ground emesis 11/2017   ESRD (end stage renal disease) ([place])    MWF   Hemodialysis patient ([place])    Hypertension    Low iron    Shortness of breath dyspnea    when lying flat   Stroke [facility])    effective vision- Left eye   Viral gastroenteritis 05/2019    Patient Active Problem List   Diagnosis Date Noted   Peripheral vertigo 05/24/2019   Viral gastroenteritis 05/22/2019   Hypertensive urgency 05/22/2019   Encounter for screening for HIV 05/22/2019   Essential hypertension    Clotted dialysis access ([place]) 07/25/2018   GERD with esophagitis 12/26/2017   Dysphagia    Non-intractable vomiting with nausea    Coffee ground emesis 12/21/2017   Chest pain 12/21/2017   Hyperkalemia 08/25/2016   Acute CVA (cerebrovascular accident) ([place]) 01/24/2016   Hypertensive emergency 01/21/2016   History of CVA (cerebrovascular accident) 01/21/2016   Visual disturbance 01/21/2016   Microcytic anemia 01/21/2016   ESRD on dialysis [facility]) 01/21/2016   Blurred vision, left eye     Past Surgical History:  Procedure Laterality Date   A/V FISTULAGRAM Right 06/25/2017   Procedure: A/V Fistulagram;  Surgeon: Elam Dutch, MD;  Location: [facility] CV LAB;  Service: Cardiovascular;  Laterality: Right;   AV FISTULA PLACEMENT Left 10/27/2016   Procedure: INSERTION LEFT UPPER ARM  OF ARTERIOVENOUS (AV) GORE-TEX GRAFT ARM;  Surgeon: Conrad [place], MD;  Location: [facility];  Service: Vascular;  Laterality: Left;   AV FISTULA PLACEMENT Right 03/23/2017   Procedure: ARTERIOVENOUS (AV) FISTULA CREATION;  Surgeon: Elam Dutch, MD;  Location: [employer];  Service: Vascular;  Laterality: Right;   [place] Left 02/24/2016   Procedure: BASILIC VEIN TRANSPOSITION VERSUS ARTERIOVENOUS GRAFT INSERTION;  Surgeon: [name], MD;  Location: [place];  Service: Vascular;  Laterality: Left;   [place] Left 07/14/2016   Procedure: LEFT BASILIC VEIN TRANSPOSITION;  Surgeon: Conrad Covington, MD;  Location: [place];  Service: Vascular;  Laterality: Left;   [place] Left 09/08/2016   Procedure: SECOND STAGE BASILIC VEIN TRANSPOSITION LEFT UPPER ARM;  Surgeon: Conrad [place], MD;  Location: [place];  Service: Vascular;  Laterality: Left;   ESOPHAGOGASTRODUODENOSCOPY N/A 12/26/2017   Procedure: ESOPHAGOGASTRODUODENOSCOPY (EGD);  Surgeon: Ladene Artist, MD;  Location: [facility] ENDOSCOPY;  Service: Endoscopy;  Laterality: N/A;   INSERTION OF DIALYSIS CATHETER N/A 02/24/2016   Procedure: INSERTION OF DIALYSIS CATHETER;  Surgeon: [name], MD;  Location: [place];  Service: Vascular;  Laterality: N/A;   INSERTION OF  DIALYSIS CATHETER Right 10/27/2016   Procedure: EXCHANGE OF RIGHT INTERNAL JUGULAR DIALYSIS CATHETER;  Surgeon: Conrad [place], MD;  Location: [place];  Service: Vascular;  Laterality: Right;   IR FLUORO GUIDE CV LINE RIGHT  07/26/2018   IR US GUIDE VASC ACCESS RIGHT  07/26/2018   PERIPHERAL VASCULAR BALLOON ANGIOPLASTY Right 06/25/2017   Procedure: Peripheral Vascular Balloon Angioplasty;  Surgeon: Elam Dutch, MD;  Location: [facility] CV LAB;  Service: Cardiovascular;  Laterality:  Right;   THROMBECTOMY AND REVISION OF ARTERIOVENTOUS (AV) GORETEX  GRAFT Left 03/02/2017   Procedure: THROMBECTOMY AND REVISION OF ARTERIOVENous left arm GORETEX  GRAFT;  Surgeon: Elam Dutch, MD;  Location: [facility] OR;  Service: Vascular;  Laterality: Left;   THROMBECTOMY W/ EMBOLECTOMY Left 12/14/2016   Procedure: THROMBECTOMY OF LEFT  ARTERIO-VENOUS GORTEX GRAFT; REVISION OF LEFT  ARTERIO-VENOUS GORE-TEX GRAFT;  Surgeon: [name], MD;  Location: [facility];  Service: Vascular;  Laterality: Left;       No family history on file.  Social History   Tobacco Use   Smoking status: Never   Smokeless tobacco: Never  Vaping Use   Vaping Use: Never used  Substance Use Topics   Alcohol use: No   Drug use: No    Home Medications Prior to Admission medications   Medication Sig Start Date End Date Taking? Authorizing Provider  acetaminophen (TYLENOL) 325 MG tablet Take 2 tablets (650 mg total) by mouth every 6 (six) hours as needed for mild pain, fever or headache. 12/24/17   Mariel Aloe, MD  amLODipine (NORVASC) 5 MG tablet Take 2 tablets (10 mg total) by mouth daily. 07/26/18   Rehman, Areeg N, DO  aspirin 81 MG chewable tablet Chew 81 mg by mouth daily.     [provider]  brimonidine (ALPHAGAN) 0.2 % ophthalmic solution Place 1 drop into the left eye 3 (three) times daily.    [provider]  calcitRIOL (ROCALTROL) 0.5 MCG capsule Take 1 capsule (0.5 mcg total) by mouth every Monday, Wednesday, and Friday with hemodialysis. Patient taking differently: Take 0.5 mcg by mouth Every Tuesday,Thursday,and Saturday with dialysis.  12/27/17   Mariel Aloe, MD  calcium carbonate (TUMS EX) 750 MG chewable tablet Chew 2 tablets by mouth as needed for heartburn.    [provider]  lanthanum (FOSRENOL) 500 MG chewable tablet Chew 500 mg by mouth 3 (three) times daily with meals.    [provider]  meclizine (ANTIVERT) 25 MG tablet Take 1 tablet (25 mg total) by  mouth 2 (two) times daily as needed for dizziness. 05/24/19   Dhungel, Flonnie Overman, MD  ondansetron (ZOFRAN ODT) 4 MG disintegrating tablet Take 1 tablet (4 mg total) by mouth every 8 (eight) hours as needed for nausea or vomiting. 12/26/17   Mariel Aloe, MD    Allergies    Pork-derived products and Ppd [tuberculin purified protein derivative]  Review of Systems   Review of Systems  Constitutional:  Negative for appetite change.  HENT:  Negative for congestion.   Respiratory:  Positive for shortness of breath.   Cardiovascular:  Negative for chest pain.  Gastrointestinal:  Negative for abdominal pain.  Musculoskeletal:  Negative for back pain.  Skin:  Negative for rash.  Neurological:  Negative for weakness.  Psychiatric/Behavioral:  Negative for behavioral problems.    Physical Exam Updated Vital Signs BP (!) 256/85   Pulse 66   Temp 98.1 F (36.7 C) (Oral)   Resp  18   SpO2 98%   Physical Exam Vitals and nursing note reviewed.  HENT:     Head: Normocephalic.     Mouth/Throat:     Mouth: Mucous membranes are moist.  Eyes:     General:        Right eye: No discharge.  Cardiovascular:     Rate and Rhythm: Regular rhythm.  Pulmonary:     Comments: Mildly harsh breath sounds without focal rales or rhonchi.  Sitting up with some pursed lips breathing. Abdominal:     Tenderness: There is no abdominal tenderness.  Musculoskeletal:     Comments: Mild edema bilateral lower extremities.  Skin:    General: Skin is warm.     Capillary Refill: Capillary refill takes less than 2 seconds.  Neurological:     Mental Status: He is alert.    ED Results / Procedures / Treatments   Labs (all labs ordered are listed, but only abnormal results are displayed) Labs Reviewed  BASIC METABOLIC PANEL - Abnormal; Notable for the following components:      Result Value   Chloride 97 (*)    BUN 36 (*)    Creatinine, Ser 7.69 (*)    GFR, Estimated 7 (*)    All other components within  normal limits  CBC - Abnormal; Notable for the following components:   RBC 4.14 (*)    Hemoglobin 11.0 (*)    HCT 35.2 (*)    RDW 16.6 (*)    All other components within normal limits  TROPONIN I (HIGH SENSITIVITY) - Abnormal; Notable for the following components:   Troponin I (High Sensitivity) 24 (*)    All other components within normal limits  TROPONIN I (HIGH SENSITIVITY) - Abnormal; Notable for the following components:   Troponin I (High Sensitivity) 27 (*)    All other components within normal limits  RESP PANEL BY RT-PCR (FLU A&B, COVID) [id number]    EKG EKG Interpretation  Date/Time:  Tuesday May 27 2021 16:51:40 EDT Ventricular Rate:  65 PR Interval:  160 QRS Duration: 90 QT Interval:  460 QTC Calculation: 478 R Axis:   74 Text Interpretation: Sinus rhythm with marked sinus arrhythmia ST changes improved from prior. Abnormal ECG Confirmed by Davonna Belling [phone]) on 05/28/2021 12:01:41 AM  Radiology DG Chest 2 View  Result Date: 05/27/2021 CLINICAL DATA:  65 year old male with shortness of breath. EXAM: CHEST - 2 VIEW COMPARISON:  Chest radiograph dated 12/29/2020. FINDINGS: Similar positioning of the left-sided vascular graft/cannula. Interval decrease in the size of the pleural effusion along the right minor fissure. Small right pleural effusion is similar to prior radiograph. No focal consolidation, or pneumothorax. Stable cardiomediastinal silhouette. Atherosclerotic calcification of the aorta. Degenerative changes of the spine. Vascular stent in the region of the left axilla. No acute osseous pathology. IMPRESSION: Interval decrease in the size of the pleural effusion along the minor fissure. No other change. Electronically Signed   By: Anner Crete M.D.   On: 05/27/2021 17:44    Procedures Procedures   Medications Ordered in ED Medications - No data to display  ED Course  I have reviewed the triage vital signs and the nursing notes.  Pertinent labs &  imaging results that were available during my care of the patient were reviewed by me and considered in my medical decision making (see chart for details).    MDM Rules/Calculators/A&P  Patient came in with shortness of breath.  States he stopped dialysis because he was feeling bad.  Does have some hypertension.  History of same.  Blood pressure has varied somewhat here.  Mostly around 341 systolic.  Is already on antihypertensive.  Lab work is reassuring.  No chest pain.  Ambulated without difficulty.  I think patient likely needs more urgent dialysis but does not s/p tonight.  Patient states can call dialysis and hopefully get in tomorrow.  EKG improved from prior.  We will be sure that he is taking his antihypertensives at home.  Does not appear to have endorgan damage at this time but would benefit from short-term follow-up but he can do this with his dialysis Final Clinical Impression(s) / ED Diagnoses Final diagnoses:  End stage renal disease on dialysis [facility])  Dyspnea, unspecified type  Hypertension, unspecified type    Rx / DC Orders ED Discharge Orders     None        Davonna Belling, MD 05/28/21 0002

## 2021-05-27 NOTE — ED Notes (Signed)
Phlebotomy at bedside.

## 2021-05-27 NOTE — ED Notes (Signed)
Pt ambulated in hallway well. 94-95% oxygen saturation throughout duration of ambulation. No complaints of SOB or pain.

## 2021-05-27 NOTE — ED Triage Notes (Signed)
Pt reports SOB since starting new BP medicine last Monday.  Went to dialysis today but stopped 1 hour 20 min before full 4 hour treatment complete because he felt bad.  Pedal edema noted.  Denies chest pain.

## 2021-05-27 NOTE — ED Provider Notes (Signed)
Emergency Medicine Provider Triage Evaluation Note  Allen Miller , a 65 y.o. male  was evaluated in triage.  Pt complains of sob x1 week. Was at dialysis pta but could not finish tx because he felt unwell. Denies chest pain. Has ble swelling.  Review of Systems  Positive: Sob, ble swelling Negative: Chest pain  Physical Exam  BP (!) 221/90 Comment: Triage nurse aware  Pulse 70   Temp 98.6 F (37 C)   Resp 18   SpO2 100%  Gen:   Awake, no distress   Resp:  Normal effort  MSK:   Moves extremities without difficulty  Other:  2-3+ pitting edema to the ble, tachypneic, decreased breath sounds with bibasilar rales  Medical Decision Making  Medically screening exam initiated at 4:53 PM.  Appropriate orders placed.  Preet Perrier was informed that the remainder of the evaluation will be completed by another provider, this initial triage assessment does not replace that evaluation, and the importance of remaining in the ED until their evaluation is complete.     Bishop Dublin 05/27/21 1654    Carmin Muskrat, MD 05/27/21 [phone]

## 2021-05-27 NOTE — Discharge Instructions (Addendum)
Your work-up is overall reassuring.  Your blood pressure is running high however.  You may need more dialysis potentially even tomorrow.  Take your blood pressure medicines

## 2022-04-27 ENCOUNTER — Ambulatory Visit (HOSPITAL_COMMUNITY)
Admission: EM | Admit: 2022-04-27 | Discharge: 2022-04-27 | Disposition: A | Discharge status: Nursing Home (Includes ICF) | Payer: No Typology Code available for payment source

## 2022-04-27 ENCOUNTER — Encounter (HOSPITAL_COMMUNITY): Payer: Self-pay

## 2022-04-27 ENCOUNTER — Encounter (HOSPITAL_COMMUNITY): Payer: Self-pay | Admitting: *Deleted

## 2022-04-27 ENCOUNTER — Emergency Department (HOSPITAL_COMMUNITY)
Admission: EM | Admit: 2022-04-27 | Discharge: 2022-04-27 | Disposition: A | Discharge status: Home / Self Care | Payer: No Typology Code available for payment source | Attending: Emergency Medicine | Admitting: Emergency Medicine

## 2022-04-27 ENCOUNTER — Other Ambulatory Visit: Payer: Self-pay

## 2022-04-27 DIAGNOSIS — Z79899 Other long term (current) drug therapy: Secondary | ICD-10-CM | POA: Insufficient documentation

## 2022-04-27 DIAGNOSIS — R234 Changes in skin texture: Secondary | ICD-10-CM | POA: Diagnosis not present

## 2022-04-27 DIAGNOSIS — N186 End stage renal disease: Secondary | ICD-10-CM | POA: Diagnosis not present

## 2022-04-27 DIAGNOSIS — I160 Hypertensive urgency: Secondary | ICD-10-CM

## 2022-04-27 DIAGNOSIS — T148XXA Other injury of unspecified body region, initial encounter: Secondary | ICD-10-CM

## 2022-04-27 DIAGNOSIS — R238 Other skin changes: Secondary | ICD-10-CM

## 2022-04-27 DIAGNOSIS — X58XXXA Exposure to other specified factors, initial encounter: Secondary | ICD-10-CM | POA: Insufficient documentation

## 2022-04-27 DIAGNOSIS — Z7982 Long term (current) use of aspirin: Secondary | ICD-10-CM | POA: Insufficient documentation

## 2022-04-27 DIAGNOSIS — I10 Essential (primary) hypertension: Secondary | ICD-10-CM | POA: Diagnosis not present

## 2022-04-27 DIAGNOSIS — I120 Hypertensive chronic kidney disease with stage 5 chronic kidney disease or end stage renal disease: Secondary | ICD-10-CM | POA: Diagnosis not present

## 2022-04-27 DIAGNOSIS — L139 Bullous disorder, unspecified: Secondary | ICD-10-CM | POA: Diagnosis not present

## 2022-04-27 DIAGNOSIS — R600 Localized edema: Secondary | ICD-10-CM

## 2022-04-27 DIAGNOSIS — I96 Gangrene, not elsewhere classified: Secondary | ICD-10-CM

## 2022-04-27 DIAGNOSIS — S91309A Unspecified open wound, unspecified foot, initial encounter: Secondary | ICD-10-CM

## 2022-04-27 DIAGNOSIS — S80821A Blister (nonthermal), right lower leg, initial encounter: Secondary | ICD-10-CM | POA: Diagnosis present

## 2022-04-27 LAB — CBC WITH DIFFERENTIAL/PLATELET
Abs Immature Granulocytes: 0.01 10*3/uL (ref 0.00–0.07)
Basophils Absolute: 0.1 10*3/uL (ref 0.0–0.1)
Basophils Relative: 1 %
Eosinophils Absolute: 0.3 10*3/uL (ref 0.0–0.5)
Eosinophils Relative: 6 %
HCT: 42.1 % (ref 39.0–52.0)
Hemoglobin: 13.6 g/dL (ref 13.0–17.0)
Immature Granulocytes: 0 %
Lymphocytes Relative: 12 %
Lymphs Abs: 0.7 10*3/uL (ref 0.7–4.0)
MCH: 28 pg (ref 26.0–34.0)
MCHC: 32.3 g/dL (ref 30.0–36.0)
MCV: 86.6 fL (ref 80.0–100.0)
Monocytes Absolute: 0.7 10*3/uL (ref 0.1–1.0)
Monocytes Relative: 11 %
Neutro Abs: 4 10*3/uL (ref 1.7–7.7)
Neutrophils Relative %: 70 %
Platelets: 225 10*3/uL (ref 150–400)
RBC: 4.86 MIL/uL (ref 4.22–5.81)
RDW: 14.6 % (ref 11.5–15.5)
WBC: 5.8 10*3/uL (ref 4.0–10.5)
nRBC: 0 % (ref 0.0–0.2)

## 2022-04-27 LAB — BASIC METABOLIC PANEL
Anion gap: 14 (ref 5–15)
BUN: 63 mg/dL — ABNORMAL HIGH (ref 8–23)
CO2: 30 mmol/L (ref 22–32)
Calcium: 9.8 mg/dL (ref 8.9–10.3)
Chloride: 94 mmol/L — ABNORMAL LOW (ref 98–111)
Creatinine, Ser: 9.69 mg/dL — ABNORMAL HIGH (ref 0.61–1.24)
GFR, Estimated: 6 mL/min — ABNORMAL LOW (ref >60)
Glucose, Bld: 112 mg/dL — ABNORMAL HIGH (ref 70–99)
Potassium: 4.8 mmol/L (ref 3.5–5.1)
Sodium: 138 mmol/L (ref 135–145)

## 2022-04-27 MED ORDER — DOXYCYCLINE HYCLATE 100 MG PO CAPS: 100.0000 mg | ORAL_CAPSULE | Freq: Two times a day (BID) | ORAL | 0 refills | Status: AC

## 2022-04-27 MED ORDER — AMLODIPINE BESYLATE 5 MG PO TABS
10.0000 mg | ORAL_TABLET | Freq: Once | ORAL | Status: AC
  Administered 2022-04-27: 10 mg via ORAL
  Filled 2022-04-27: qty 2

## 2022-04-27 NOTE — Discharge Instructions (Addendum)
Please follow-up for chronic wound care with the wound clinic.  Recommend referral for follow-up with dermatology for biopsy of your bullae.  We will discharge you on a course of doxycycline.

## 2022-04-27 NOTE — ED Provider Triage Note (Signed)
Emergency Medicine Provider Triage Evaluation Note  Allen Miller , a 66 y.o. male  was evaluated in triage.  Pt complains of blisters to his right lower extremity.  History of end-stage renal disease on hemodialysis and renal cancer.  Patient first noted a small blister to his right foot last week.  Initially the seem to be improving however over the past 2 days, he developed larger bullae to the right lower extremity.  No fevers, chills, vomiting.  The large blister on the top of the foot broke open.  He has never had blisters like this before.  He denies skin trauma to the area.  Review of Systems  Positive: Multiple bullae to the right lower leg Negative: Fever  Physical Exam  BP (!) 206/65 (BP Location: Right Arm)   Pulse 75   Temp 98.4 F (36.9 C) (Oral)   Resp 16   Ht '5\' 5"'$  (1.651 m)   Wt 72.1 kg   SpO2 98%   BMI 26.46 kg/m  Gen:   Awake, no distress   Resp:  Normal effort  MSK:   Moves extremities without difficulty  Other:  Multiple fluid-filled bullae to the right ankle area.  There is a broken bulla over the top of the right foot with clean base.   Medical Decision Making  Medically screening exam initiated at 8:23 PM.  Appropriate orders placed.  Tobe Kervin was informed that the remainder of the evaluation will be completed by another provider, this initial triage assessment does not replace that evaluation, and the importance of remaining in the ED until their evaluation is complete.     Carlisle Cater, PA-C 04/27/22 2026

## 2022-04-27 NOTE — ED Triage Notes (Signed)
Hemodialysis pt q Tues, Thurs, Sat.  Reports noticing RLE blister 6 days ago; yesterday started with right foot and lower leg swelling and multiple blisters; sloughing of skin noted to right foot. States started with left foot and lower leg swelling today. Denies fevers. Reports vision "like I'm in a fog" today; states vision usually "not like this".

## 2022-04-27 NOTE — ED Triage Notes (Signed)
Pt reports with a right top foot blister that opened when he took his sock off on Saturday. Pt reports having dialysis on Saturday and is due for his next on Tuesday.

## 2022-04-27 NOTE — ED Provider Notes (Signed)
[place]    CSN: [id number] Arrival date & time: 04/27/22  1907      History   Chief Complaint Chief Complaint  Patient presents with   Leg Swelling    Hemodialysis pt   Blister    HPI Allen Miller is a 66 y.o. male.   HPI Patient with a medical history of renal cancer, hemodialysis patient, MRSA, hypertension presents today with bilateral leg swelling and blistering involving the right leg and foot x 3 days.  Patient has dialysis on Mondays Wednesdays and Fridays.  His last dialysis treatment was on Friday.  He reports noticing blistery boil like lesions erupted on his lower leg following his dialysis treatment Friday.  He reports over the last 3 days the swelling has worsened and blisters have erupted more become more distributed to his right leg.  He attempted to burst the blister on his right foot and reports over the last 2 days the skin on right foot has torn away and now his foot is raw.  He endorses "felling off and vision is different".  His blood pressure is elevated 188/57 on recheck, initial reading 202/81.  Patient is due for dialysis tomorrow.  Past Medical History:  Diagnosis Date   Cancer ([place])    kidney cancer   Coffee ground emesis 11/2017   ESRD (end stage renal disease) ([place])    MWF   Hemodialysis patient ([place])    Hypertension    Low iron    Shortness of breath dyspnea    when lying flat   Stroke [facility])    effective vision- Left eye   Viral gastroenteritis 05/2019    Patient Active Problem List   Diagnosis Date Noted   Peripheral vertigo 05/24/2019   Viral gastroenteritis 05/22/2019   Hypertensive urgency 05/22/2019   Encounter for screening for HIV 05/22/2019   Essential hypertension    Clotted dialysis access ([place]) 07/25/2018   GERD with esophagitis 12/26/2017   Dysphagia    Non-intractable vomiting with nausea    Coffee ground emesis 12/21/2017   Chest pain 12/21/2017   Hyperkalemia 08/25/2016   Acute CVA (cerebrovascular  accident) ([place]) 01/24/2016   Hypertensive emergency 01/21/2016   History of CVA (cerebrovascular accident) 01/21/2016   Visual disturbance 01/21/2016   Microcytic anemia 01/21/2016   ESRD on dialysis [facility]) 01/21/2016   Blurred vision, left eye     Past Surgical History:  Procedure Laterality Date   A/V FISTULAGRAM Right 06/25/2017   Procedure: A/V Fistulagram;  Surgeon: Elam Dutch, MD;  Location: [place] CV LAB;  Service: Cardiovascular;  Laterality: Right;   AV FISTULA PLACEMENT Left 10/27/2016   Procedure: INSERTION LEFT UPPER ARM  OF ARTERIOVENOUS (AV) GORE-TEX GRAFT ARM;  Surgeon: Conrad [place], MD;  Location: [place];  Service: Vascular;  Laterality: Left;   AV FISTULA PLACEMENT Right 03/23/2017   Procedure: ARTERIOVENOUS (AV) FISTULA CREATION;  Surgeon: Elam Dutch, MD;  Location: [place];  Service: Vascular;  Laterality: Right;   [place] Left 02/24/2016   Procedure: BASILIC VEIN TRANSPOSITION VERSUS ARTERIOVENOUS GRAFT INSERTION;  Surgeon: [name], MD;  Location: [place];  Service: Vascular;  Laterality: Left;   [place] Left 07/14/2016   Procedure: LEFT BASILIC VEIN TRANSPOSITION;  Surgeon: Conrad [place], MD;  Location: [place];  Service: Vascular;  Laterality: Left;   [place] Left 09/08/2016   Procedure: SECOND STAGE BASILIC VEIN TRANSPOSITION LEFT UPPER ARM;  Surgeon: Conrad Ma[place]i, MD;  Location: [facility] OR;  Service: Vascular;  Laterality: Left;   ESOPHAGOGASTRODUODENOSCOPY N/A 12/26/2017   Procedure: ESOPHAGOGASTRODUODENOSCOPY (EGD);  Surgeon: Ladene Artist, MD;  Location: [facility] ENDOSCOPY;  Service: Endoscopy;  Laterality: N/A;   INSERTION OF DIALYSIS CATHETER N/A 02/24/2016   Procedure: INSERTION OF DIALYSIS CATHETER;  Surgeon: [name], MD;  Location: [place];  Service: Vascular;  Laterality: N/A;   INSERTION OF DIALYSIS CATHETER Right 10/27/2016   Procedure: EXCHANGE OF RIGHT INTERNAL JUGULAR DIALYSIS CATHETER;   Surgeon: Conrad [place], MD;  Location: [place];  Service: Vascular;  Laterality: Right;   IR FLUORO GUIDE CV LINE RIGHT  07/26/2018   IR US GUIDE VASC ACCESS RIGHT  07/26/2018   PERIPHERAL VASCULAR BALLOON ANGIOPLASTY Right 06/25/2017   Procedure: Peripheral Vascular Balloon Angioplasty;  Surgeon: Elam Dutch, MD;  Location: [place] CV LAB;  Service: Cardiovascular;  Laterality: Right;   THROMBECTOMY AND REVISION OF ARTERIOVENTOUS (AV) GORETEX  GRAFT Left 03/02/2017   Procedure: THROMBECTOMY AND REVISION OF ARTERIOVENous left arm GORETEX  GRAFT;  Surgeon: Elam Dutch, MD;  Location: [facility] OR;  Service: Vascular;  Laterality: Left;   THROMBECTOMY W/ EMBOLECTOMY Left 12/14/2016   Procedure: THROMBECTOMY OF LEFT  ARTERIO-VENOUS GORTEX GRAFT; REVISION OF LEFT  ARTERIO-VENOUS GORE-TEX GRAFT;  Surgeon: [name], MD;  Location: [place];  Service: Vascular;  Laterality: Left;       Home Medications    Prior to Admission medications   Medication Sig Start Date End Date Taking? Authorizing Provider  amLODipine (NORVASC) 5 MG tablet Take 2 tablets (10 mg total) by mouth daily. 07/26/18  Yes Rehman, Areeg N, DO  aspirin 81 MG chewable tablet Chew 81 mg by mouth daily.    Yes [provider]  brimonidine (ALPHAGAN) 0.2 % ophthalmic solution Place 1 drop into the left eye 3 (three) times daily.   Yes [provider]  calcitRIOL (ROCALTROL) 0.5 MCG capsule Take 1 capsule (0.5 mcg total) by mouth every Monday, Wednesday, and Friday with hemodialysis. Patient taking differently: Take 0.5 mcg by mouth Every Tuesday,Thursday,and Saturday with dialysis. 12/27/17  Yes Mariel Aloe, MD  lanthanum (FOSRENOL) 500 MG chewable tablet Chew 500 mg by mouth 3 (three) times daily with meals.   Yes [provider]  acetaminophen (TYLENOL) 325 MG tablet Take 2 tablets (650 mg total) by mouth every 6 (six) hours as needed for mild pain, fever or headache. 12/24/17   Mariel Aloe, MD   calcium carbonate (TUMS EX) 750 MG chewable tablet Chew 2 tablets by mouth as needed for heartburn.    [provider]  meclizine (ANTIVERT) 25 MG tablet Take 1 tablet (25 mg total) by mouth 2 (two) times daily as needed for dizziness. 05/24/19   Dhungel, Flonnie Overman, MD  ondansetron (ZOFRAN ODT) 4 MG disintegrating tablet Take 1 tablet (4 mg total) by mouth every 8 (eight) hours as needed for nausea or vomiting. 12/26/17   Mariel Aloe, MD    Family History No family history on file.  Social History Social History   Tobacco Use   Smoking status: Never   Smokeless tobacco: Never  Vaping Use   Vaping Use: Never used  Substance Use Topics   Alcohol use: No   Drug use: No     Allergies   Pork-derived products and Ppd [tuberculin purified protein derivative]   Review of Systems Review of Systems Pertinent negatives listed in HPI   Physical Exam Triage Vital Signs ED Triage  Vitals  Enc Vitals Group     BP      Pulse      Resp      Temp      Temp src      SpO2      Weight      Height      Head Circumference      Peak Flow      Pain Score      Pain Loc      Pain Edu?      Excl. in [place]?    No data found.  Updated Vital Signs BP (!) 188/57 (BP Location: Right Arm)   Pulse 72   Temp 98.3 F (36.8 C) (Oral)   Resp 16   SpO2 100%   Visual Acuity Right Eye Distance:   Left Eye Distance:   Bilateral Distance:    Right Eye Near:   Left Eye Near:    Bilateral Near:     Physical Exam Constitutional:      Appearance: Normal appearance.  HENT:     Head: Normocephalic.  Cardiovascular:     Rate and Rhythm: Normal rate.  Pulmonary:     Effort: Pulmonary effort is normal.     Breath sounds: Normal breath sounds.  Musculoskeletal:     Right lower leg: 4+ Pitting Edema present.     Left lower leg: Pitting Edema present.  Skin:    General: Skin is warm.     Findings: Erythema and wound present.     Comments: Boils (multiple) lower right leg with  erythema involving the lower right leg and right foot with dermal layer of skin visible   Neurological:     Mental Status: He is alert.     Gait: Gait abnormal.  Psychiatric:        Mood and Affect: Mood normal.        Behavior: Behavior normal.   Limited exam as patient is being deferred to th Emergency Department  UC Treatments / Results  Labs (all labs ordered are listed, but only abnormal results are displayed) Labs Reviewed - No data to display  EKG   Radiology No results found.  Procedures Procedures (including critical care time)  Medications Ordered in UC Medications - No data to display  Initial Impression / Assessment and Plan / UC Course  I have reviewed the triage vital signs and the nursing notes.  Pertinent labs & imaging results that were available during my care of the patient were reviewed by me and considered in my medical decision making (see chart for details).     Patient is accompanied by family member and being directed to follow-up at the emergency department.  Given the limitations of urgent care and the complexity of patient's presenting patient warrants further work-up and evaluation in the setting of the emergency department.  Patient is stable to be transported by personal vehicle to [facility]. Patient and family verbalized understanding and agreement with plan. Final Clinical Impressions(s) / UC Diagnoses   Final diagnoses:  Bilateral lower extremity edema  Skin sloughing ([place])  Hypertensive urgency   Discharge Instructions   None    ED Prescriptions   None    PDMP not reviewed this encounter.   Scot Jun, FNP 04/27/22 2002

## 2022-04-27 NOTE — ED Provider Notes (Signed)
[place] DEPT Provider Note   CSN: [id number] Arrival date & time: 04/27/22  1959     History {Add pertinent medical, surgical, social history, OB history to HPI:1} Chief Complaint  Patient presents with   Blister    Allen Miller is a 66 y.o. male.  HPI     Home Medications Prior to Admission medications   Medication Sig Start Date End Date Taking? Authorizing Provider  acetaminophen (TYLENOL) 325 MG tablet Take 2 tablets (650 mg total) by mouth every 6 (six) hours as needed for mild pain, fever or headache. 12/24/17   Mariel Aloe, MD  amLODipine (NORVASC) 5 MG tablet Take 2 tablets (10 mg total) by mouth daily. 07/26/18   Rehman, Areeg N, DO  aspirin 81 MG chewable tablet Chew 81 mg by mouth daily.     [provider]  brimonidine (ALPHAGAN) 0.2 % ophthalmic solution Place 1 drop into the left eye 3 (three) times daily.    [provider]  calcitRIOL (ROCALTROL) 0.5 MCG capsule Take 1 capsule (0.5 mcg total) by mouth every Monday, Wednesday, and Friday with hemodialysis. Patient taking differently: Take 0.5 mcg by mouth Every Tuesday,Thursday,and Saturday with dialysis. 12/27/17   Mariel Aloe, MD  calcium carbonate (TUMS EX) 750 MG chewable tablet Chew 2 tablets by mouth as needed for heartburn.    [provider]  lanthanum (FOSRENOL) 500 MG chewable tablet Chew 500 mg by mouth 3 (three) times daily with meals.    [provider]  meclizine (ANTIVERT) 25 MG tablet Take 1 tablet (25 mg total) by mouth 2 (two) times daily as needed for dizziness. 05/24/19   Dhungel, Flonnie Overman, MD  ondansetron (ZOFRAN ODT) 4 MG disintegrating tablet Take 1 tablet (4 mg total) by mouth every 8 (eight) hours as needed for nausea or vomiting. 12/26/17   Mariel Aloe, MD      Allergies    Pork-derived products and Ppd [tuberculin purified protein derivative]    Review of Systems   Review of Systems  Physical Exam Updated  Vital Signs BP (!) 190/77   Pulse 77   Temp 98.4 F (36.9 C) (Oral)   Resp 18   Ht '5\' 5"'$  (1.651 m)   Wt 72.1 kg   SpO2 99%   BMI 26.46 kg/m  Physical Exam  ED Results / Procedures / Treatments   Labs (all labs ordered are listed, but only abnormal results are displayed) Labs Reviewed  BASIC METABOLIC PANEL - Abnormal; Notable for the following components:      Result Value   Chloride 94 (*)    Glucose, Bld 112 (*)    BUN 63 (*)    Creatinine, Ser 9.69 (*)    GFR, Estimated 6 (*)    All other components within normal limits  CBC WITH DIFFERENTIAL/PLATELET    EKG None  Radiology No results found.  Procedures Procedures  {Document cardiac monitor, telemetry assessment procedure when appropriate:1}  Medications Ordered in ED Medications  amLODipine (NORVASC) tablet 10 mg (10 mg Oral Given 04/27/22 2044)    ED Course/ Medical Decision Making/ A&P                           Medical Decision Making Amount and/or Complexity of Data Reviewed Labs: ordered.  Risk Prescription drug management.   ***  {Document critical care time when appropriate:1} {Document review of labs and clinical decision tools ie heart score, Chads2Vasc2  etc:1}  {Document your independent review of radiology images, and any outside records:1} {Document your discussion with family members, caretakers, and with consultants:1} {Document social determinants of health affecting pt's care:1} {Document your decision making why or why not admission, treatments were needed:1} Final Clinical Impression(s) / ED Diagnoses Final diagnoses:  None    Rx / DC Orders ED Discharge Orders     None

## 2022-04-27 NOTE — ED Notes (Signed)
Patient is being discharged from the Urgent Care and sent to the Emergency Department via private vehicle with family . Per K. Harris, NP, patient is in need of higher level of care due to hemodialysis pt with BLE edema and vision changes. Patient is aware and verbalizes understanding of plan of care.  Vitals:   04/27/22 1921 04/27/22 1923  BP: (!) 202/81 (!) 188/57  Pulse:    Resp:    Temp:    SpO2:

## 2022-05-26 DIAGNOSIS — M7989 Other specified soft tissue disorders: Secondary | ICD-10-CM | POA: Diagnosis not present

## 2022-05-26 DIAGNOSIS — Z135 Encounter for screening for eye and ear disorders: Secondary | ICD-10-CM | POA: Diagnosis not present

## 2022-05-26 DIAGNOSIS — E875 Hyperkalemia: Secondary | ICD-10-CM | POA: Diagnosis not present

## 2022-05-26 DIAGNOSIS — E1169 Type 2 diabetes mellitus with other specified complication: Secondary | ICD-10-CM | POA: Diagnosis not present

## 2022-05-26 DIAGNOSIS — M86171 Other acute osteomyelitis, right ankle and foot: Secondary | ICD-10-CM | POA: Diagnosis not present

## 2022-05-26 DIAGNOSIS — L97529 Non-pressure chronic ulcer of other part of left foot with unspecified severity: Secondary | ICD-10-CM | POA: Diagnosis not present

## 2022-05-26 DIAGNOSIS — D649 Anemia, unspecified: Secondary | ICD-10-CM | POA: Diagnosis not present

## 2022-05-26 DIAGNOSIS — E11621 Type 2 diabetes mellitus with foot ulcer: Secondary | ICD-10-CM | POA: Diagnosis not present

## 2022-05-26 DIAGNOSIS — I96 Gangrene, not elsewhere classified: Secondary | ICD-10-CM | POA: Diagnosis not present

## 2022-05-26 DIAGNOSIS — Z8615 Personal history of latent tuberculosis infection: Secondary | ICD-10-CM | POA: Diagnosis not present

## 2022-05-26 DIAGNOSIS — E1152 Type 2 diabetes mellitus with diabetic peripheral angiopathy with gangrene: Secondary | ICD-10-CM | POA: Diagnosis not present

## 2022-05-26 DIAGNOSIS — Z992 Dependence on renal dialysis: Secondary | ICD-10-CM | POA: Diagnosis not present

## 2022-05-26 DIAGNOSIS — L97519 Non-pressure chronic ulcer of other part of right foot with unspecified severity: Secondary | ICD-10-CM | POA: Diagnosis not present

## 2022-05-26 DIAGNOSIS — I120 Hypertensive chronic kidney disease with stage 5 chronic kidney disease or end stage renal disease: Secondary | ICD-10-CM | POA: Diagnosis not present

## 2022-05-26 DIAGNOSIS — Z8673 Personal history of transient ischemic attack (TIA), and cerebral infarction without residual deficits: Secondary | ICD-10-CM | POA: Diagnosis not present

## 2022-05-26 DIAGNOSIS — L03116 Cellulitis of left lower limb: Secondary | ICD-10-CM | POA: Diagnosis not present

## 2022-05-26 DIAGNOSIS — M868X7 Other osteomyelitis, ankle and foot: Secondary | ICD-10-CM | POA: Diagnosis not present

## 2022-05-26 DIAGNOSIS — M869 Osteomyelitis, unspecified: Secondary | ICD-10-CM | POA: Diagnosis not present

## 2022-05-26 DIAGNOSIS — N186 End stage renal disease: Secondary | ICD-10-CM | POA: Diagnosis not present

## 2022-06-17 DIAGNOSIS — M869 Osteomyelitis, unspecified: Secondary | ICD-10-CM | POA: Diagnosis not present

## 2022-06-30 ENCOUNTER — Emergency Department (HOSPITAL_COMMUNITY)
Admission: EM | Admit: 2022-06-30 | Discharge: 2022-06-30 | Disposition: A | Discharge status: Home / Self Care | Payer: No Typology Code available for payment source | Attending: Emergency Medicine | Admitting: Emergency Medicine

## 2022-06-30 ENCOUNTER — Emergency Department (HOSPITAL_COMMUNITY): Payer: No Typology Code available for payment source

## 2022-06-30 ENCOUNTER — Other Ambulatory Visit: Payer: Self-pay

## 2022-06-30 ENCOUNTER — Encounter (HOSPITAL_COMMUNITY): Payer: Self-pay | Admitting: Emergency Medicine

## 2022-06-30 DIAGNOSIS — S0502XA Injury of conjunctiva and corneal abrasion without foreign body, left eye, initial encounter: Secondary | ICD-10-CM | POA: Insufficient documentation

## 2022-06-30 DIAGNOSIS — L03213 Periorbital cellulitis: Secondary | ICD-10-CM | POA: Insufficient documentation

## 2022-06-30 DIAGNOSIS — Z7982 Long term (current) use of aspirin: Secondary | ICD-10-CM | POA: Diagnosis not present

## 2022-06-30 DIAGNOSIS — N186 End stage renal disease: Secondary | ICD-10-CM | POA: Insufficient documentation

## 2022-06-30 DIAGNOSIS — S0592XA Unspecified injury of left eye and orbit, initial encounter: Secondary | ICD-10-CM | POA: Diagnosis present

## 2022-06-30 DIAGNOSIS — E1139 Type 2 diabetes mellitus with other diabetic ophthalmic complication: Secondary | ICD-10-CM | POA: Insufficient documentation

## 2022-06-30 DIAGNOSIS — Z8673 Personal history of transient ischemic attack (TIA), and cerebral infarction without residual deficits: Secondary | ICD-10-CM | POA: Diagnosis not present

## 2022-06-30 DIAGNOSIS — E1122 Type 2 diabetes mellitus with diabetic chronic kidney disease: Secondary | ICD-10-CM | POA: Diagnosis not present

## 2022-06-30 DIAGNOSIS — R519 Headache, unspecified: Secondary | ICD-10-CM | POA: Diagnosis not present

## 2022-06-30 DIAGNOSIS — I120 Hypertensive chronic kidney disease with stage 5 chronic kidney disease or end stage renal disease: Secondary | ICD-10-CM | POA: Diagnosis not present

## 2022-06-30 DIAGNOSIS — H409 Unspecified glaucoma: Secondary | ICD-10-CM | POA: Diagnosis not present

## 2022-06-30 DIAGNOSIS — Z992 Dependence on renal dialysis: Secondary | ICD-10-CM | POA: Insufficient documentation

## 2022-06-30 DIAGNOSIS — X58XXXA Exposure to other specified factors, initial encounter: Secondary | ICD-10-CM | POA: Diagnosis not present

## 2022-06-30 DIAGNOSIS — I10 Essential (primary) hypertension: Secondary | ICD-10-CM

## 2022-06-30 LAB — CBC WITH DIFFERENTIAL/PLATELET
Abs Immature Granulocytes: 0.02 10*3/uL (ref 0.00–0.07)
Basophils Absolute: 0.1 10*3/uL (ref 0.0–0.1)
Basophils Relative: 1 %
Eosinophils Absolute: 0.3 10*3/uL (ref 0.0–0.5)
Eosinophils Relative: 9 %
HCT: 30.6 % — ABNORMAL LOW (ref 39.0–52.0)
Hemoglobin: 9.5 g/dL — ABNORMAL LOW (ref 13.0–17.0)
Immature Granulocytes: 1 %
Lymphocytes Relative: 11 %
Lymphs Abs: 0.4 10*3/uL — ABNORMAL LOW (ref 0.7–4.0)
MCH: 25.7 pg — ABNORMAL LOW (ref 26.0–34.0)
MCHC: 31 g/dL (ref 30.0–36.0)
MCV: 82.7 fL (ref 80.0–100.0)
Monocytes Absolute: 0.7 10*3/uL (ref 0.1–1.0)
Monocytes Relative: 19 %
Neutro Abs: 2.1 10*3/uL (ref 1.7–7.7)
Neutrophils Relative %: 59 %
Platelets: 269 10*3/uL (ref 150–400)
RBC: 3.7 MIL/uL — ABNORMAL LOW (ref 4.22–5.81)
RDW: 18 % — ABNORMAL HIGH (ref 11.5–15.5)
WBC: 3.5 10*3/uL — ABNORMAL LOW (ref 4.0–10.5)
nRBC: 0 % (ref 0.0–0.2)

## 2022-06-30 LAB — URINALYSIS, ROUTINE W REFLEX MICROSCOPIC
Bacteria, UA: NONE SEEN
Bilirubin Urine: NEGATIVE
Glucose, UA: 50 mg/dL — AB
Hgb urine dipstick: NEGATIVE
Ketones, ur: NEGATIVE mg/dL
Nitrite: NEGATIVE
Protein, ur: >=300 mg/dL — AB
Specific Gravity, Urine: 1.008 (ref 1.005–1.030)
pH: 8 (ref 5.0–8.0)

## 2022-06-30 LAB — TROPONIN I (HIGH SENSITIVITY)
Troponin I (High Sensitivity): 55 ng/L — ABNORMAL HIGH (ref <18)
Troponin I (High Sensitivity): 52 ng/L — ABNORMAL HIGH (ref <18)

## 2022-06-30 LAB — BASIC METABOLIC PANEL
Anion gap: 9 (ref 5–15)
BUN: 14 mg/dL (ref 8–23)
CO2: 27 mmol/L (ref 22–32)
Calcium: 8.7 mg/dL — ABNORMAL LOW (ref 8.9–10.3)
Chloride: 95 mmol/L — ABNORMAL LOW (ref 98–111)
Creatinine, Ser: 4.96 mg/dL — ABNORMAL HIGH (ref 0.61–1.24)
GFR, Estimated: 12 mL/min — ABNORMAL LOW (ref >60)
Glucose, Bld: 103 mg/dL — ABNORMAL HIGH (ref 70–99)
Potassium: 3.5 mmol/L (ref 3.5–5.1)
Sodium: 131 mmol/L — ABNORMAL LOW (ref 135–145)

## 2022-06-30 MED ORDER — AMOXICILLIN-POT CLAVULANATE 500-125 MG PO TABS: 1.0000 | ORAL_TABLET | Freq: Every day | ORAL | 0 refills | Status: AC

## 2022-06-30 MED ORDER — POLYMYXIN B-TRIMETHOPRIM 10000-0.1 UNIT/ML-% OP SOLN: 1.0000 [drp] | OPHTHALMIC | 0 refills | Status: AC

## 2022-06-30 MED ORDER — FLUORESCEIN SODIUM 1 MG OP STRP
1.0000 | ORAL_STRIP | Freq: Once | OPHTHALMIC | Status: AC
  Administered 2022-06-30: 1 via OPHTHALMIC
  Filled 2022-06-30: qty 1

## 2022-06-30 MED ORDER — TETRACAINE HCL 0.5 % OP SOLN
1.0000 [drp] | Freq: Once | OPHTHALMIC | Status: AC
  Administered 2022-06-30: 1 [drp] via OPHTHALMIC
  Filled 2022-06-30: qty 4

## 2022-06-30 MED ORDER — AMOXICILLIN-POT CLAVULANATE 500-125 MG PO TABS
1.0000 | ORAL_TABLET | Freq: Once | ORAL | Status: AC
Start: 2022-06-30 — End: 2022-06-30
  Administered 2022-06-30: 500 mg via ORAL
  Filled 2022-06-30 (×2): qty 1

## 2022-06-30 MED ORDER — HYDRALAZINE HCL 20 MG/ML IJ SOLN
15.0000 mg | Freq: Once | INTRAMUSCULAR | Status: AC
  Administered 2022-06-30: 15 mg via INTRAVENOUS
  Filled 2022-06-30: qty 1

## 2022-06-30 MED ORDER — HYDRALAZINE HCL 20 MG/ML IJ SOLN
15.0000 mg | Freq: Once | INTRAMUSCULAR | Status: AC
Start: 2022-06-30 — End: 2022-06-30
  Administered 2022-06-30: 15 mg via INTRAVENOUS
  Filled 2022-06-30: qty 1

## 2022-06-30 NOTE — Discharge Instructions (Addendum)
Read the information about corneal abrasions and preseptal cellulitis on these papers.  I sent 2 medications to the pharmacy.  The first 1 is an eyedrop to treat the scratch on your eye.  The oral antibiotic is called Augmentin.  This will cover your skin infection surrounding your eye.  Follow-up with your ophthalmologist as soon as you can this week to make sure that you are not getting better.  Return with any worsening symptoms.

## 2022-06-30 NOTE — ED Notes (Signed)
Patient verbalizes understanding of discharge instructions. Opportunity for questioning and answers were provided. Armband removed by staff, pt discharged from ED. Pt stated he did not want a wheelchair. Pt ambulatory to ED waiting room with steady gait using walker.

## 2022-06-30 NOTE — ED Provider Notes (Signed)
[place] EMERGENCY DEPARTMENT Provider Note   CSN: [id number] Arrival date & time: 06/30/22  1728     History  Chief Complaint  Patient presents with   Hypertension    Allen Miller is a 66 y.o. male with a past medical history of ESRD on dialysis, hypertensive emergency, CVA and glaucoma presenting today due to hypertension.  He reports that earlier today he took his BP and it was 200s over 100s.  He believes is secondary to pain in his eye.  He has glaucoma and is currently on timolol shots and thinks that he touched the bottle to his eye while trying to put them in.  He also says that a week ago he noted his left eye began to swell.  He was seen by ophthalmology and they started him on ophthalmic antibiotic that he cannot remember the name and does not have it with him.  Complains of mild posterior headache, chest pain, newly blurred vision.  He reports that he has lost nearly all the vision in his left eye.Marland Kitchen   Hypertension       Home Medications Prior to Admission medications   Medication Sig Start Date End Date Taking? Authorizing Provider  acetaminophen (TYLENOL) 325 MG tablet Take 2 tablets (650 mg total) by mouth every 6 (six) hours as needed for mild pain, fever or headache. 12/24/17   Mariel Aloe, MD  amLODipine (NORVASC) 5 MG tablet Take 2 tablets (10 mg total) by mouth daily. 07/26/18   Rehman, Areeg N, DO  aspirin 81 MG chewable tablet Chew 81 mg by mouth daily.     [provider]  brimonidine (ALPHAGAN) 0.2 % ophthalmic solution Place 1 drop into the left eye 3 (three) times daily.    [provider]  calcitRIOL (ROCALTROL) 0.5 MCG capsule Take 1 capsule (0.5 mcg total) by mouth every Monday, Wednesday, and Friday with hemodialysis. Patient taking differently: Take 0.5 mcg by mouth Every Tuesday,Thursday,and Saturday with dialysis. 12/27/17   Mariel Aloe, MD  calcium carbonate (TUMS EX) 750 MG chewable tablet Chew 2 tablets  by mouth as needed for heartburn.    [provider]  lanthanum (FOSRENOL) 500 MG chewable tablet Chew 500 mg by mouth 3 (three) times daily with meals.    [provider]  meclizine (ANTIVERT) 25 MG tablet Take 1 tablet (25 mg total) by mouth 2 (two) times daily as needed for dizziness. 05/24/19   Dhungel, Flonnie Overman, MD  ondansetron (ZOFRAN ODT) 4 MG disintegrating tablet Take 1 tablet (4 mg total) by mouth every 8 (eight) hours as needed for nausea or vomiting. 12/26/17   Mariel Aloe, MD      Allergies    Pork-derived products and Ppd [tuberculin purified protein derivative]    Review of Systems   Review of Systems  Physical Exam Updated Vital Signs BP (!) 193/83   Pulse 77   Temp 98.4 F (36.9 C) (Oral)   Resp 14   Ht '5\' 5"'$  (1.651 m)   Wt 68 kg   SpO2 95%   BMI 24.96 kg/m  Physical Exam Vitals and nursing note reviewed.  Constitutional:      General: He is not in acute distress.    Appearance: Normal appearance. He is not ill-appearing.  HENT:     Head: Normocephalic and atraumatic.  Eyes:     General: No scleral icterus.    Extraocular Movements:     Right eye: Normal extraocular motion.  Left eye: Normal extraocular motion.     Conjunctiva/sclera:     Left eye: Left conjunctiva is injected. Chemosis present.     Comments: Woods lamp exam revealing of left-sided corneal abrasion just over the superior iris  Cardiovascular:     Rate and Rhythm: Normal rate and regular rhythm.  Pulmonary:     Effort: Pulmonary effort is normal. No respiratory distress.  Skin:    Findings: No rash.  Neurological:     Mental Status: He is alert.  Psychiatric:        Mood and Affect: Mood normal.     ED Results / Procedures / Treatments   Labs (all labs ordered are listed, but only abnormal results are displayed) Labs Reviewed  CBC WITH DIFFERENTIAL/PLATELET - Abnormal; Notable for the following components:      Result Value   WBC 3.5 (*)    RBC 3.70 (*)     Hemoglobin 9.5 (*)    HCT 30.6 (*)    MCH 25.7 (*)    RDW 18.0 (*)    Lymphs Abs 0.4 (*)    All other components within normal limits  BASIC METABOLIC PANEL - Abnormal; Notable for the following components:   Sodium 131 (*)    Chloride 95 (*)    Glucose, Bld 103 (*)    Creatinine, Ser 4.96 (*)    Calcium 8.7 (*)    GFR, Estimated 12 (*)    All other components within normal limits  TROPONIN I (HIGH SENSITIVITY) - Abnormal; Notable for the following components:   Troponin I (High Sensitivity) 55 (*)    All other components within normal limits  URINALYSIS, ROUTINE W REFLEX MICROSCOPIC    EKG EKG Interpretation  Date/Time:  Tuesday June 30 2022 18:30:31 EDT Ventricular Rate:  76 PR Interval:  160 QRS Duration: 96 QT Interval:  451 QTC Calculation: 508 R Axis:   68 Text Interpretation: Sinus rhythm Prolonged QT interval No significant change since last tracing Confirmed by Wandra Arthurs [phone] on 06/30/2022 8:18:20 PM  Radiology CT Head Wo Contrast  Result Date: 06/30/2022 CLINICAL DATA:  New onset headaches on the left for several hours, initial encounter EXAM: CT HEAD WITHOUT CONTRAST TECHNIQUE: Contiguous axial images were obtained from the base of the skull through the vertex without intravenous contrast. RADIATION DOSE REDUCTION: This exam was performed according to the departmental dose-optimization program which includes automated exposure control, adjustment of the mA and/or kV according to patient size and/or use of iterative reconstruction technique. COMPARISON:  12/22/2018 FINDINGS: Brain: No evidence of acute infarction, hemorrhage, hydrocephalus, extra-axial collection or mass lesion/mass effect. Chronic left posterior parietal infarct is noted as well as scattered lacunar infarcts in the basal ganglia particularly on the right. Chronic white matter ischemic changes are noted as well. Vascular: No hyperdense vessel or unexpected calcification. Skull: Normal. Negative  for fracture or focal lesion. Sinuses/Orbits: No acute finding. Other: None. IMPRESSION: Chronic ischemic changes without acute abnormality. Electronically Signed   By: Inez Catalina M.D.   On: 06/30/2022 19:04    Procedures Procedures   Medications Ordered in ED Medications  hydrALAZINE (APRESOLINE) injection 15 mg (15 mg Intravenous Given 06/30/22 1858)  tetracaine (PONTOCAINE) 0.5 % ophthalmic solution 1 drop (1 drop Left Eye Given 06/30/22 1923)  fluorescein ophthalmic strip 1 strip (1 strip Left Eye Given 06/30/22 1923)  hydrALAZINE (APRESOLINE) injection 15 mg (15 mg Intravenous Given 06/30/22 1957)    ED Course/ Medical Decision Making/ A&P  Medical Decision Making Risk Prescription drug management.   This patient presents to the ED for concern of HTN. Concerns include HTN urgency/emergency, renal failure and ACS   This is not an exhaustive differential.    Past Medical History / Co-morbidities / Social History: ESRD, DM, HTN   Additional history: I reviewed patient's outpatient visit with ophthalmology from March.  He has an extensive history of glaucoma, most recent IOP greater than 40 in the left eye.   Physical Exam: Pertinent physical exam findings include injected and chemotic left eye.  PERRLA.  -Woods lamp exam revealing of a left-sided corneal abrasion.    Lab Tests: I personally interpreted labs.  The pertinent results include:  -Troponins 55 and 52 -Creatinine 4.96, GFR 12 which are better than previous labs and consistent with his known kidney disease   Imaging Studies: I ordered and independently visualized and interpreted CT head which showed no findings. I agree with the radiologist interpretation.     Medications: I ordered medication including hydralazine. Reevaluation of the patient after these medicines showed that the patient improved.  Blood pressure came down to 160s/70s.  Pain also improved with tetracaine  drops   Disposition: 66 year old male who presented today with hypertension.  With further history taking patient reports he has been increasingly hypertensive over the past day secondary to severe pain in his left eye.  He has been taking his medications as prescribed.  The only medication that he knows he is on for hypertension is hydralazine.  He follows closely with ophthalmology for his glaucoma.  I reviewed his outpatient notes with him from March and my exam is consistent with theirs.  Right-sided IOP 29, left in the mid 30s on my exam. Triage hypertensive emergency labs were ordered.  Troponin stable and patient is without chest pain.  EKG is nonischemic.  CT head was negative.  Considered admission for mild electrolyte derangements however patient is hemodynamically stable.  Incidental hypertension is afebrile.  Do not want to bolus fluid is due to his renal disease.  Follow-up per chart review he has chronic hyponatremia.  He is asymptomatic of this. After consideration of the diagnostic results and the patients response to treatment, I feel that he is stable for discharge home.  Will proceed with his normal dialysis schedule.   I discussed this case with my attending physician Dr. Darl Householder who cosigned this note including patient's presenting symptoms, physical exam, and planned diagnostics and interventions. Attending physician stated agreement with plan or made changes to plan which were implemented.      Final Clinical Impression(s) / ED Diagnoses Final diagnoses:  Hypertension, unspecified type  Abrasion of left cornea, initial encounter  Preseptal cellulitis of left eye    Rx / DC Orders ED Discharge Orders          Ordered    amoxicillin-clavulanate (AUGMENTIN) 500-125 MG tablet  Daily        06/30/22 2242    trimethoprim-polymyxin b (POLYTRIM) ophthalmic solution  Every 4 hours        06/30/22 2242           Results and diagnoses were explained to the patient. Return  precautions discussed in full. Patient had no additional questions and expressed complete understanding.   This chart was dictated using voice recognition software.  Despite best efforts to proofread,  errors can occur which can change the documentation meaning.    Rhae Hammock, PA-C 06/30/22 2320    Drenda Freeze,  MD 06/30/22 2326

## 2022-06-30 NOTE — ED Provider Triage Note (Signed)
Emergency Medicine Provider Triage Evaluation Note  Allen Miller , a 66 y.o. male  was evaluated in triage.  Pt complains of left-sided headache that started around noon today associated with intermittent dizziness.  Patient admits to elevated blood pressure.  Patient had a full dialysis session today.  Patient notes his speech is "slower than normal".  Denies slurred speech.  Denies unilateral weakness.  Patient states symptoms have been persistent since noon. History of CVA. No CP or SOB  Review of Systems  Positive: HA, dizziness Negative: CP  Physical Exam  BP (!) 198/80 (BP Location: Right Arm)   Pulse 74   Temp 98.4 F (36.9 C) (Oral)   Resp 18   SpO2 99%  Gen:   Awake, no distress   Resp:  Normal effort  MSK:   Moves extremities without difficulty  Other:  No slurred speech. No facial droop. Equal grip strength  Medical Decision Making  Medically screening exam initiated at 6:03 PM.  Appropriate orders placed.  Allen Miller was informed that the remainder of the evaluation will be completed by another provider, this initial triage assessment does not replace that evaluation, and the importance of remaining in the ED until their evaluation is complete.  Out of TNK/TPA window. Possible hypertensive emergency? CT head ordered BP 198/80 in triage Labs ordered.  Informed charge RN need for room   Allen Miller 06/30/22 7628

## 2022-06-30 NOTE — ED Triage Notes (Signed)
Pt here from home for hypertension w/ HA and intermittent dizziness. Pt reports he took his BP meds today but his readings at home, after dialysis, were 300 systolic. Pt gets dialysis T/Th/Sat, compliant.

## 2022-07-22 DIAGNOSIS — M869 Osteomyelitis, unspecified: Secondary | ICD-10-CM | POA: Diagnosis not present

## 2022-08-04 ENCOUNTER — Emergency Department (HOSPITAL_COMMUNITY)
Admission: EM | Admit: 2022-08-04 | Discharge: 2022-08-05 | Disposition: A | Discharge status: Home / Self Care | Payer: No Typology Code available for payment source | Attending: Emergency Medicine | Admitting: Emergency Medicine

## 2022-08-04 ENCOUNTER — Encounter (HOSPITAL_COMMUNITY): Payer: Self-pay

## 2022-08-04 ENCOUNTER — Other Ambulatory Visit: Payer: Self-pay

## 2022-08-04 DIAGNOSIS — X58XXXA Exposure to other specified factors, initial encounter: Secondary | ICD-10-CM | POA: Diagnosis not present

## 2022-08-04 DIAGNOSIS — Z7982 Long term (current) use of aspirin: Secondary | ICD-10-CM | POA: Insufficient documentation

## 2022-08-04 DIAGNOSIS — S0502XA Injury of conjunctiva and corneal abrasion without foreign body, left eye, initial encounter: Secondary | ICD-10-CM | POA: Diagnosis present

## 2022-08-04 DIAGNOSIS — H5712 Ocular pain, left eye: Secondary | ICD-10-CM

## 2022-08-04 NOTE — ED Notes (Signed)
Patient states he is having a hard time breathing. Oxygen 100% rr 22. Triage RN notified.

## 2022-08-04 NOTE — ED Triage Notes (Signed)
Reports started having eye pain in left eye this am.  Patient has swollen left eye with clear drainage. Patient is already blind in that eye.

## 2022-08-04 NOTE — ED Provider Triage Note (Signed)
Emergency Medicine Provider Triage Evaluation Note  Allen Miller , a 66 y.o. male  was evaluated in triage.  Pt complains of left eye pain, redness and drainage started this morning.  History of glaucoma.  Patient seen in the ER about a month ago for same and diagnosed with corneal abrasion and conjunctivitis at that time.  Patient does take medications for his glaucoma he tried that this morning did not have any relief of symptoms.  Denies any trauma to the eye.  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  No headaches..  Review of Systems  Positive: Left eye pain and drainage Negative: Fevers, headaches  Physical Exam  BP (!) 142/61   Pulse 72   Temp 97.7 F (36.5 C) (Oral)   Resp 18   Ht '5\' 5"'$  (1.651 m)   Wt 68 kg   SpO2 97%   BMI 24.96 kg/m  Gen:   Awake, no distress   Resp:  Normal effort  MSK:   Moves extremities without difficulty  Other:  Patient has some clear drainage from the left eye with some associated erythema of the eyelids.  There is no significant swelling appreciated.  Pupils equal round and reactive.  No chemosis appreciated EOMs are intact.  Medical Decision Making  Medically screening exam initiated at 6:19 PM.  Appropriate orders placed.  Norm Wray was informed that the remainder of the evaluation will be completed by another provider, this initial triage assessment does not replace that evaluation, and the importance of remaining in the ED until their evaluation is complete.  Patient presents for left eye redness and drainage.  History of glaucoma.  Patient denies trauma.  Lab work and imaging not indicated at this time.   Doristine Devoid, PA-C 08/04/22 1821

## 2022-08-05 ENCOUNTER — Encounter (HOSPITAL_COMMUNITY): Payer: Self-pay

## 2022-08-05 MED ORDER — ERYTHROMYCIN 5 MG/GM OP OINT
1.0000 | TOPICAL_OINTMENT | Freq: Once | OPHTHALMIC | Status: AC
  Administered 2022-08-05: 1 via OPHTHALMIC
  Filled 2022-08-05: qty 3.5

## 2022-08-05 MED ORDER — TETRACAINE HCL 0.5 % OP SOLN
2.0000 [drp] | Freq: Once | OPHTHALMIC | Status: AC
  Administered 2022-08-05: 2 [drp] via OPHTHALMIC
  Filled 2022-08-05: qty 4

## 2022-08-05 MED ORDER — FLUORESCEIN SODIUM 1 MG OP STRP
1.0000 | ORAL_STRIP | Freq: Once | OPHTHALMIC | Status: AC
  Administered 2022-08-05: 1 via OPHTHALMIC
  Filled 2022-08-05: qty 1

## 2022-08-05 NOTE — ED Provider Notes (Signed)
[facility] EMERGENCY DEPARTMENT Provider Note   CSN: [id number] Arrival date & time: 08/04/22  1732     History  Chief Complaint  Patient presents with   Eye Pain    Allen Miller is a 66 y.o. male.  66 year old male with a history of glaucoma in his left eye and is blind from the same.  Patient has had a couple episodes of conjunctivitis over the last year.  He presents tonight with left eye pain.  He states it feels like it scratched.  There is no pain around his eye or headache associated with it.  He has quite a bit of tearing and injection in his left eye he states that that is new.  No deep aching pains.  No vision changes or any other changes with his right eye.  He wonders if maybe he scratched in his sleep.  Has been going on for the last 20 hours.   Eye Pain       Home Medications Prior to Admission medications   Medication Sig Start Date End Date Taking? Authorizing Provider  acetaminophen (TYLENOL) 325 MG tablet Take 2 tablets (650 mg total) by mouth every 6 (six) hours as needed for mild pain, fever or headache. 12/24/17   Mariel Aloe, MD  amLODipine (NORVASC) 5 MG tablet Take 2 tablets (10 mg total) by mouth daily. 07/26/18   Rehman, Areeg N, DO  amoxicillin-clavulanate (AUGMENTIN) 500-125 MG tablet Take 1 tablet (500 mg total) by mouth daily. 06/30/22   Redwine, Madison A, PA-C  aspirin 81 MG chewable tablet Chew 81 mg by mouth daily.     [provider]  atropine 1 % ophthalmic solution Place 1 drop into the left eye 3 (three) times daily.    [provider]  brimonidine (ALPHAGAN) 0.2 % ophthalmic solution Place 1 drop into the left eye 3 (three) times daily.    [provider]  Brinzolamide-Brimonidine (SIMBRINZA) 1-0.2 % SUSP Apply 1 drop to eye in the morning and at bedtime.    [provider]  calcitRIOL (ROCALTROL) 0.5 MCG capsule Take 1 capsule (0.5 mcg total) by mouth every Monday, Wednesday, and Friday  with hemodialysis. Patient taking differently: Take 0.5 mcg by mouth Every Tuesday,Thursday,and Saturday with dialysis. 12/27/17   Mariel Aloe, MD  calcium carbonate (TUMS EX) 750 MG chewable tablet Chew 2 tablets by mouth as needed for heartburn.    [provider]  hydrALAZINE (APRESOLINE) 25 MG tablet Take 25 mg by mouth 4 (four) times daily.    [provider]  lanthanum (FOSRENOL) 500 MG chewable tablet Chew 500 mg by mouth 3 (three) times daily with meals.    [provider]  meclizine (ANTIVERT) 25 MG tablet Take 1 tablet (25 mg total) by mouth 2 (two) times daily as needed for dizziness. 05/24/19   Dhungel, Flonnie Overman, MD  ondansetron (ZOFRAN ODT) 4 MG disintegrating tablet Take 1 tablet (4 mg total) by mouth every 8 (eight) hours as needed for nausea or vomiting. 12/26/17   Mariel Aloe, MD  timolol (BETIMOL) 0.5 % ophthalmic solution Place 1 drop into the left eye 2 (two) times daily.    [provider]  trimethoprim-polymyxin b (POLYTRIM) ophthalmic solution Place 1 drop into the left eye every 4 (four) hours. 06/30/22   Redwine, Madison A, PA-C      Allergies    Pork-derived products and Ppd [tuberculin purified protein derivative]    Review of Systems  Review of Systems  Eyes:  Positive for pain.    Physical Exam Updated Vital Signs BP (!) 183/81   Pulse 80   Temp 98.6 F (37 C)   Resp 18   Ht '5\' 5"'$  (1.651 m)   Wt 68 kg   SpO2 98%   BMI 24.96 kg/m  Physical Exam Vitals and nursing note reviewed.  Constitutional:      Appearance: He is well-developed.  HENT:     Head: Normocephalic and atraumatic.  Eyes:     Extraocular Movements: Extraocular movements intact.     Comments: Left eye conjunctival injection Extraocular movements intact Left pupil nonreactive Right pupil reactive to light  Small corneal abrasion around the 500 position of left eye  Cardiovascular:     Rate and Rhythm: Normal rate.  Pulmonary:     Effort:  Pulmonary effort is normal. No respiratory distress.  Abdominal:     General: There is no distension.  Musculoskeletal:        General: Normal range of motion.     Cervical back: Normal range of motion.  Neurological:     Mental Status: He is alert and oriented to person, place, and time.     Cranial Nerves: No cranial nerve deficit.     ED Results / Procedures / Treatments   Labs (all labs ordered are listed, but only abnormal results are displayed) Labs Reviewed - No data to display  EKG None  Radiology No results found.  Procedures Procedures    Medications Ordered in ED Medications  fluorescein ophthalmic strip 1 strip (1 strip Left Eye Given by Other 08/05/22 0202)  tetracaine (PONTOCAINE) 0.5 % ophthalmic solution 2 drop (2 drops Left Eye Given by Other 08/05/22 0203)  erythromycin ophthalmic ointment 1 Application (1 Application Left Eye Given 08/05/22 0318)    ED Course/ Medical Decision Making/ A&P                           Medical Decision Making Risk Prescription drug management.   Eval for corneal abrasion, fluorescein tetracaine ordered.  Small abrasion noted. Erythromycin ointment and ophthalmology follow up.   Final Clinical Impression(s) / ED Diagnoses Final diagnoses:  Left eye pain  Abrasion of left cornea, initial encounter    Rx / DC Orders ED Discharge Orders     None         Dason Mosley, Corene Cornea, MD 08/05/22 0410

## 2022-08-10 DIAGNOSIS — M869 Osteomyelitis, unspecified: Secondary | ICD-10-CM | POA: Diagnosis not present

## 2022-08-31 DIAGNOSIS — N186 End stage renal disease: Secondary | ICD-10-CM | POA: Diagnosis not present

## 2022-08-31 DIAGNOSIS — L97521 Non-pressure chronic ulcer of other part of left foot limited to breakdown of skin: Secondary | ICD-10-CM | POA: Diagnosis not present

## 2022-08-31 DIAGNOSIS — S90424A Blister (nonthermal), right lesser toe(s), initial encounter: Secondary | ICD-10-CM | POA: Diagnosis not present

## 2022-08-31 DIAGNOSIS — L97511 Non-pressure chronic ulcer of other part of right foot limited to breakdown of skin: Secondary | ICD-10-CM | POA: Diagnosis not present

## 2022-08-31 DIAGNOSIS — M869 Osteomyelitis, unspecified: Secondary | ICD-10-CM | POA: Diagnosis not present

## 2022-08-31 DIAGNOSIS — Z992 Dependence on renal dialysis: Secondary | ICD-10-CM | POA: Diagnosis not present

## 2022-09-07 DIAGNOSIS — L97511 Non-pressure chronic ulcer of other part of right foot limited to breakdown of skin: Secondary | ICD-10-CM | POA: Diagnosis not present

## 2022-09-07 DIAGNOSIS — L97521 Non-pressure chronic ulcer of other part of left foot limited to breakdown of skin: Secondary | ICD-10-CM | POA: Diagnosis not present

## 2022-09-07 DIAGNOSIS — Z992 Dependence on renal dialysis: Secondary | ICD-10-CM | POA: Diagnosis not present

## 2022-09-07 DIAGNOSIS — N186 End stage renal disease: Secondary | ICD-10-CM | POA: Diagnosis not present

## 2022-09-16 DIAGNOSIS — Z992 Dependence on renal dialysis: Secondary | ICD-10-CM | POA: Diagnosis not present

## 2022-09-16 DIAGNOSIS — L97511 Non-pressure chronic ulcer of other part of right foot limited to breakdown of skin: Secondary | ICD-10-CM | POA: Diagnosis not present

## 2022-09-16 DIAGNOSIS — L97521 Non-pressure chronic ulcer of other part of left foot limited to breakdown of skin: Secondary | ICD-10-CM | POA: Diagnosis not present

## 2022-09-16 DIAGNOSIS — S90424A Blister (nonthermal), right lesser toe(s), initial encounter: Secondary | ICD-10-CM | POA: Diagnosis not present

## 2022-09-16 DIAGNOSIS — N186 End stage renal disease: Secondary | ICD-10-CM | POA: Diagnosis not present

## 2022-09-28 DIAGNOSIS — Z992 Dependence on renal dialysis: Secondary | ICD-10-CM | POA: Diagnosis not present

## 2022-09-28 DIAGNOSIS — I96 Gangrene, not elsewhere classified: Secondary | ICD-10-CM | POA: Diagnosis not present

## 2022-09-28 DIAGNOSIS — N186 End stage renal disease: Secondary | ICD-10-CM | POA: Diagnosis not present

## 2022-09-28 DIAGNOSIS — L97512 Non-pressure chronic ulcer of other part of right foot with fat layer exposed: Secondary | ICD-10-CM | POA: Diagnosis not present

## 2022-10-05 DIAGNOSIS — Z992 Dependence on renal dialysis: Secondary | ICD-10-CM | POA: Diagnosis not present

## 2022-10-05 DIAGNOSIS — L97512 Non-pressure chronic ulcer of other part of right foot with fat layer exposed: Secondary | ICD-10-CM | POA: Diagnosis not present

## 2022-10-05 DIAGNOSIS — N186 End stage renal disease: Secondary | ICD-10-CM | POA: Diagnosis not present

## 2022-10-05 DIAGNOSIS — I96 Gangrene, not elsewhere classified: Secondary | ICD-10-CM | POA: Diagnosis not present

## 2022-10-12 DIAGNOSIS — Z992 Dependence on renal dialysis: Secondary | ICD-10-CM | POA: Diagnosis not present

## 2022-10-12 DIAGNOSIS — N186 End stage renal disease: Secondary | ICD-10-CM | POA: Diagnosis not present

## 2022-10-12 DIAGNOSIS — I96 Gangrene, not elsewhere classified: Secondary | ICD-10-CM | POA: Diagnosis not present

## 2022-10-20 DIAGNOSIS — L97512 Non-pressure chronic ulcer of other part of right foot with fat layer exposed: Secondary | ICD-10-CM | POA: Diagnosis not present

## 2022-10-20 DIAGNOSIS — K219 Gastro-esophageal reflux disease without esophagitis: Secondary | ICD-10-CM | POA: Diagnosis not present

## 2022-10-20 DIAGNOSIS — H401190 Primary open-angle glaucoma, unspecified eye, stage unspecified: Secondary | ICD-10-CM | POA: Diagnosis not present

## 2022-10-20 DIAGNOSIS — N186 End stage renal disease: Secondary | ICD-10-CM | POA: Diagnosis not present

## 2022-10-20 DIAGNOSIS — M86171 Other acute osteomyelitis, right ankle and foot: Secondary | ICD-10-CM | POA: Diagnosis not present

## 2022-10-20 DIAGNOSIS — E11621 Type 2 diabetes mellitus with foot ulcer: Secondary | ICD-10-CM | POA: Diagnosis not present

## 2022-10-20 DIAGNOSIS — Z992 Dependence on renal dialysis: Secondary | ICD-10-CM | POA: Diagnosis not present

## 2022-10-20 DIAGNOSIS — E1169 Type 2 diabetes mellitus with other specified complication: Secondary | ICD-10-CM | POA: Diagnosis not present

## 2022-10-20 DIAGNOSIS — M869 Osteomyelitis, unspecified: Secondary | ICD-10-CM | POA: Diagnosis not present

## 2022-10-20 DIAGNOSIS — E1152 Type 2 diabetes mellitus with diabetic peripheral angiopathy with gangrene: Secondary | ICD-10-CM | POA: Diagnosis not present

## 2022-10-20 DIAGNOSIS — N2581 Secondary hyperparathyroidism of renal origin: Secondary | ICD-10-CM | POA: Diagnosis not present

## 2022-10-20 DIAGNOSIS — I120 Hypertensive chronic kidney disease with stage 5 chronic kidney disease or end stage renal disease: Secondary | ICD-10-CM | POA: Diagnosis not present

## 2022-10-20 DIAGNOSIS — I96 Gangrene, not elsewhere classified: Secondary | ICD-10-CM | POA: Diagnosis not present

## 2023-01-11 DIAGNOSIS — Z992 Dependence on renal dialysis: Secondary | ICD-10-CM | POA: Diagnosis not present

## 2023-01-11 DIAGNOSIS — Z89431 Acquired absence of right foot: Secondary | ICD-10-CM | POA: Diagnosis not present

## 2023-01-11 DIAGNOSIS — T25222D Burn of second degree of left foot, subsequent encounter: Secondary | ICD-10-CM | POA: Diagnosis not present

## 2023-01-11 DIAGNOSIS — N186 End stage renal disease: Secondary | ICD-10-CM | POA: Diagnosis not present

## 2023-01-11 DIAGNOSIS — L97521 Non-pressure chronic ulcer of other part of left foot limited to breakdown of skin: Secondary | ICD-10-CM | POA: Diagnosis not present

## 2023-01-19 DIAGNOSIS — I469 Cardiac arrest, cause unspecified: Secondary | ICD-10-CM | POA: Diagnosis not present

## 2023-01-19 DIAGNOSIS — E162 Hypoglycemia, unspecified: Secondary | ICD-10-CM | POA: Diagnosis not present

## 2023-01-19 DIAGNOSIS — R58 Hemorrhage, not elsewhere classified: Secondary | ICD-10-CM | POA: Diagnosis not present
